# Patient Record
Sex: Male | Born: 1940
Health system: Southern US, Community
[De-identification: ages and names within clinical notes are randomized; demographics above are authoritative.]

## PROBLEM LIST (undated history)

## (undated) DIAGNOSIS — I459 Conduction disorder, unspecified: Secondary | ICD-10-CM

## (undated) DIAGNOSIS — I771 Stricture of artery: Secondary | ICD-10-CM

## (undated) DIAGNOSIS — I2510 Atherosclerotic heart disease of native coronary artery without angina pectoris: Secondary | ICD-10-CM

## (undated) DIAGNOSIS — M1990 Unspecified osteoarthritis, unspecified site: Secondary | ICD-10-CM

## (undated) DIAGNOSIS — Z95828 Presence of other vascular implants and grafts: Secondary | ICD-10-CM

## (undated) DIAGNOSIS — S92901A Unspecified fracture of right foot, initial encounter for closed fracture: Secondary | ICD-10-CM

## (undated) DIAGNOSIS — E7800 Pure hypercholesterolemia, unspecified: Secondary | ICD-10-CM

## (undated) DIAGNOSIS — S8010XA Contusion of unspecified lower leg, initial encounter: Secondary | ICD-10-CM

## (undated) DIAGNOSIS — I10 Essential (primary) hypertension: Secondary | ICD-10-CM

## (undated) HISTORY — DX: Presence of other vascular implants and grafts: Z95.828

## (undated) HISTORY — PX: PROSTATECTOMY: SHX69

## (undated) HISTORY — DX: Atherosclerotic heart disease of native coronary artery without angina pectoris: I25.10

## (undated) HISTORY — PX: CATARACT EXTRACTION W/ INTRAOCULAR LENS  IMPLANT, BILATERAL: SHX1307

---

## 1994-12-19 DIAGNOSIS — S92901A Unspecified fracture of right foot, initial encounter for closed fracture: Secondary | ICD-10-CM

## 1994-12-19 HISTORY — DX: Unspecified fracture of right foot, initial encounter for closed fracture: S92.901A

## 2006-12-19 HISTORY — PX: CORONARY ARTERY BYPASS GRAFT: SHX141

## 2014-12-19 DIAGNOSIS — Z95828 Presence of other vascular implants and grafts: Secondary | ICD-10-CM

## 2014-12-19 HISTORY — PX: SUBCLAVIAN ARTERY STENT: SHX2452

## 2014-12-19 HISTORY — PX: CORONARY ANGIOPLASTY WITH STENT PLACEMENT: SHX49

## 2014-12-19 HISTORY — DX: Presence of other vascular implants and grafts: Z95.828

## 2017-08-16 DIAGNOSIS — H04123 Dry eye syndrome of bilateral lacrimal glands: Secondary | ICD-10-CM | POA: Diagnosis not present

## 2017-08-16 DIAGNOSIS — H401132 Primary open-angle glaucoma, bilateral, moderate stage: Secondary | ICD-10-CM | POA: Diagnosis not present

## 2017-08-16 DIAGNOSIS — H538 Other visual disturbances: Secondary | ICD-10-CM | POA: Diagnosis not present

## 2017-08-16 DIAGNOSIS — H5712 Ocular pain, left eye: Secondary | ICD-10-CM | POA: Diagnosis not present

## 2017-08-16 DIAGNOSIS — H10503 Unspecified blepharoconjunctivitis, bilateral: Secondary | ICD-10-CM | POA: Diagnosis not present

## 2017-08-16 DIAGNOSIS — H353131 Nonexudative age-related macular degeneration, bilateral, early dry stage: Secondary | ICD-10-CM | POA: Diagnosis not present

## 2017-08-18 DIAGNOSIS — H04123 Dry eye syndrome of bilateral lacrimal glands: Secondary | ICD-10-CM | POA: Diagnosis not present

## 2017-08-18 DIAGNOSIS — H5712 Ocular pain, left eye: Secondary | ICD-10-CM | POA: Diagnosis not present

## 2017-08-18 DIAGNOSIS — H10503 Unspecified blepharoconjunctivitis, bilateral: Secondary | ICD-10-CM | POA: Diagnosis not present

## 2017-08-18 DIAGNOSIS — H401132 Primary open-angle glaucoma, bilateral, moderate stage: Secondary | ICD-10-CM | POA: Diagnosis not present

## 2017-08-18 DIAGNOSIS — H353131 Nonexudative age-related macular degeneration, bilateral, early dry stage: Secondary | ICD-10-CM | POA: Diagnosis not present

## 2017-08-18 DIAGNOSIS — H538 Other visual disturbances: Secondary | ICD-10-CM | POA: Diagnosis not present

## 2017-08-23 DIAGNOSIS — I2510 Atherosclerotic heart disease of native coronary artery without angina pectoris: Secondary | ICD-10-CM | POA: Diagnosis not present

## 2017-08-23 DIAGNOSIS — Z6827 Body mass index (BMI) 27.0-27.9, adult: Secondary | ICD-10-CM | POA: Diagnosis not present

## 2017-08-23 DIAGNOSIS — Z951 Presence of aortocoronary bypass graft: Secondary | ICD-10-CM | POA: Diagnosis not present

## 2017-08-23 DIAGNOSIS — E7800 Pure hypercholesterolemia, unspecified: Secondary | ICD-10-CM | POA: Diagnosis not present

## 2017-08-23 DIAGNOSIS — I119 Hypertensive heart disease without heart failure: Secondary | ICD-10-CM | POA: Diagnosis not present

## 2017-08-25 DIAGNOSIS — H10503 Unspecified blepharoconjunctivitis, bilateral: Secondary | ICD-10-CM | POA: Diagnosis not present

## 2017-08-25 DIAGNOSIS — H401132 Primary open-angle glaucoma, bilateral, moderate stage: Secondary | ICD-10-CM | POA: Diagnosis not present

## 2017-08-25 DIAGNOSIS — H04123 Dry eye syndrome of bilateral lacrimal glands: Secondary | ICD-10-CM | POA: Diagnosis not present

## 2017-08-25 DIAGNOSIS — H538 Other visual disturbances: Secondary | ICD-10-CM | POA: Diagnosis not present

## 2017-08-25 DIAGNOSIS — H353131 Nonexudative age-related macular degeneration, bilateral, early dry stage: Secondary | ICD-10-CM | POA: Diagnosis not present

## 2017-08-25 DIAGNOSIS — H5712 Ocular pain, left eye: Secondary | ICD-10-CM | POA: Diagnosis not present

## 2017-08-28 DIAGNOSIS — H5712 Ocular pain, left eye: Secondary | ICD-10-CM | POA: Diagnosis not present

## 2017-08-28 DIAGNOSIS — H353131 Nonexudative age-related macular degeneration, bilateral, early dry stage: Secondary | ICD-10-CM | POA: Diagnosis not present

## 2017-08-28 DIAGNOSIS — H401132 Primary open-angle glaucoma, bilateral, moderate stage: Secondary | ICD-10-CM | POA: Diagnosis not present

## 2017-08-28 DIAGNOSIS — H04123 Dry eye syndrome of bilateral lacrimal glands: Secondary | ICD-10-CM | POA: Diagnosis not present

## 2017-08-28 DIAGNOSIS — H538 Other visual disturbances: Secondary | ICD-10-CM | POA: Diagnosis not present

## 2017-08-28 DIAGNOSIS — H10503 Unspecified blepharoconjunctivitis, bilateral: Secondary | ICD-10-CM | POA: Diagnosis not present

## 2018-01-19 DIAGNOSIS — S8010XA Contusion of unspecified lower leg, initial encounter: Secondary | ICD-10-CM

## 2018-01-19 HISTORY — DX: Contusion of unspecified lower leg, initial encounter: S80.10XA

## 2018-02-03 ENCOUNTER — Encounter (HOSPITAL_COMMUNITY): Payer: Self-pay | Admitting: Emergency Medicine

## 2018-02-03 ENCOUNTER — Other Ambulatory Visit: Payer: Self-pay

## 2018-02-03 DIAGNOSIS — I10 Essential (primary) hypertension: Secondary | ICD-10-CM | POA: Insufficient documentation

## 2018-02-03 DIAGNOSIS — R2241 Localized swelling, mass and lump, right lower limb: Secondary | ICD-10-CM | POA: Insufficient documentation

## 2018-02-03 DIAGNOSIS — R238 Other skin changes: Secondary | ICD-10-CM | POA: Diagnosis not present

## 2018-02-03 DIAGNOSIS — M7981 Nontraumatic hematoma of soft tissue: Secondary | ICD-10-CM | POA: Diagnosis not present

## 2018-02-03 NOTE — ED Triage Notes (Addendum)
Pt to ED with c/o left lower leg swelling and bruise at the calve muscle.  No known injury  Pt is currently taking Plavix

## 2018-02-04 ENCOUNTER — Emergency Department (HOSPITAL_COMMUNITY)
Admission: EM | Admit: 2018-02-04 | Discharge: 2018-02-04 | Disposition: A | Discharge status: Home / Self Care | Payer: Medicare Other | Attending: Emergency Medicine | Admitting: Emergency Medicine

## 2018-02-04 DIAGNOSIS — T148XXA Other injury of unspecified body region, initial encounter: Secondary | ICD-10-CM

## 2018-02-04 HISTORY — DX: Pure hypercholesterolemia, unspecified: E78.00

## 2018-02-04 HISTORY — DX: Essential (primary) hypertension: I10

## 2018-02-04 NOTE — Discharge Instructions (Signed)
Continue your Plavix. Compression stocking as needed.

## 2018-02-04 NOTE — ED Provider Notes (Signed)
[facility]   CSN: [id number] Arrival date & time: 02/03/18  2109     History   Chief Complaint Chief Complaint  Patient presents with  . Leg Swelling    HPI Robert GreavesRichard Flippen is a 77 y.o. male.  Chief complaint is leg bruise  HPI 77 year old male.  On Plavix for history of PTCA with stents.  He noticed that his right lower leg had an area of "purple" discoloration on it.  It is "a little sore".  No known injury.  No swelling.  No pleuritic chest pain or shortness of breath.  No history of DVT or PE.  Past Medical History:  Diagnosis Date  . High cholesterol   . Hypertension     There are no active problems to display for this patient.   Past Surgical History:  Procedure Laterality Date  . cardiac stents    . CARDIAC SURGERY    . PROSTATE SURGERY         Home Medications    Prior to Admission medications   Not on File    Family History No family history on file.  Social History Social History   Tobacco Use  . Smoking status: Never Smoker  . Smokeless tobacco: Never Used  Substance Use Topics  . Alcohol use: Yes  . Drug use: No     Allergies   Patient has no allergy information on record.   Review of Systems Review of Systems  Constitutional: Negative for appetite change, chills, diaphoresis, fatigue and fever.  HENT: Negative for mouth sores, sore throat and trouble swallowing.   Eyes: Negative for visual disturbance.  Respiratory: Negative for cough, chest tightness, shortness of breath and wheezing.   Cardiovascular: Negative for chest pain.  Gastrointestinal: Negative for abdominal distention, abdominal pain, diarrhea, nausea and vomiting.  Endocrine: Negative for polydipsia, polyphagia and polyuria.  Genitourinary: Negative for dysuria, frequency and hematuria.  Musculoskeletal: Negative for gait problem.  Skin: Positive for color change. Negative for pallor and rash.  Neurological:  Negative for dizziness, syncope, light-headedness and headaches.  Hematological: Does not bruise/bleed easily.  Psychiatric/Behavioral: Negative for behavioral problems and confusion.     Physical Exam Updated Vital Signs BP (!) 161/75 (BP Location: Right Arm)   Pulse 65   Temp 98.6 F (37 C) (Oral)   Resp 15   Ht 5\' 9"  (1.753 m)   Wt 81.6 kg (180 lb)   SpO2 100%   BMI 26.58 kg/m   Physical Exam  Constitutional: He is oriented to person, place, and time. He appears well-developed and well-nourished. No distress.  HENT:  Head: Normocephalic.  Eyes: Conjunctivae are normal. Pupils are equal, round, and reactive to light. No scleral icterus.  Neck: Normal range of motion. Neck supple. No thyromegaly present.  Cardiovascular: Normal rate and regular rhythm. Exam reveals no gallop and no friction rub.  No murmur heard. Pulmonary/Chest: Effort normal and breath sounds normal. No respiratory distress. He has no wheezes. He has no rales.  Abdominal: Soft. Bowel sounds are normal. He exhibits no distension. There is no tenderness. There is no rebound.  Musculoskeletal: Normal range of motion.  Neurological: He is alert and oriented to person, place, and time.  Skin: No rash noted.  Area of circular discoloration consistent with hematoma in the right lower leg.  No palpable cords.  No swelling.  Psychiatric: He has a normal mood and affect. His behavior is normal.     ED Treatments /  Results  Labs (all labs ordered are listed, but only abnormal results are displayed) Labs Reviewed - No data to display  EKG  EKG Interpretation None       Radiology No results found.  Procedures Procedures (including critical care time)  Medications Ordered in ED Medications - No data to display   Initial Impression / Assessment and Plan / ED Course  I have reviewed the triage vital signs and the nursing notes.  Pertinent labs & imaging results that were available during my care of  the patient were reviewed by me and considered in my medical decision making (see chart for details).     Appropriate for discharge home.  Continue Plavix.  Ace wrap or compression stocking  Final Clinical Impressions(s) / ED Diagnoses   Final diagnoses:  Hematoma    ED Discharge Orders    None       Rolland Porter, MD 02/04/18 [phone]

## 2018-03-28 ENCOUNTER — Telehealth: Payer: Self-pay

## 2018-03-28 DIAGNOSIS — I2510 Atherosclerotic heart disease of native coronary artery without angina pectoris: Secondary | ICD-10-CM | POA: Diagnosis not present

## 2018-03-28 DIAGNOSIS — E7800 Pure hypercholesterolemia, unspecified: Secondary | ICD-10-CM | POA: Diagnosis not present

## 2018-03-28 DIAGNOSIS — Z951 Presence of aortocoronary bypass graft: Secondary | ICD-10-CM | POA: Diagnosis not present

## 2018-03-28 DIAGNOSIS — Z1389 Encounter for screening for other disorder: Secondary | ICD-10-CM | POA: Diagnosis not present

## 2018-03-28 DIAGNOSIS — I10 Essential (primary) hypertension: Secondary | ICD-10-CM | POA: Diagnosis not present

## 2018-03-28 NOTE — Telephone Encounter (Signed)
Sent referral to scheduling 

## 2018-05-04 ENCOUNTER — Encounter: Payer: Self-pay | Admitting: Cardiology

## 2018-05-22 DIAGNOSIS — I2510 Atherosclerotic heart disease of native coronary artery without angina pectoris: Secondary | ICD-10-CM | POA: Diagnosis not present

## 2018-05-22 DIAGNOSIS — Z23 Encounter for immunization: Secondary | ICD-10-CM | POA: Diagnosis not present

## 2018-05-22 DIAGNOSIS — Z1389 Encounter for screening for other disorder: Secondary | ICD-10-CM | POA: Diagnosis not present

## 2018-05-22 DIAGNOSIS — I10 Essential (primary) hypertension: Secondary | ICD-10-CM | POA: Diagnosis not present

## 2018-05-22 DIAGNOSIS — E7800 Pure hypercholesterolemia, unspecified: Secondary | ICD-10-CM | POA: Diagnosis not present

## 2018-05-22 DIAGNOSIS — Z0000 Encounter for general adult medical examination without abnormal findings: Secondary | ICD-10-CM | POA: Diagnosis not present

## 2018-05-22 DIAGNOSIS — N400 Enlarged prostate without lower urinary tract symptoms: Secondary | ICD-10-CM | POA: Diagnosis not present

## 2018-05-25 ENCOUNTER — Ambulatory Visit (INDEPENDENT_AMBULATORY_CARE_PROVIDER_SITE_OTHER): Payer: Medicare Other | Admitting: Cardiology

## 2018-05-25 ENCOUNTER — Encounter: Payer: Self-pay | Admitting: Cardiology

## 2018-05-25 VITALS — BP 142/78 | HR 80 | Ht 69.0 in | Wt 182.0 lb

## 2018-05-25 DIAGNOSIS — I2510 Atherosclerotic heart disease of native coronary artery without angina pectoris: Secondary | ICD-10-CM | POA: Diagnosis not present

## 2018-05-25 DIAGNOSIS — E785 Hyperlipidemia, unspecified: Secondary | ICD-10-CM | POA: Diagnosis not present

## 2018-05-25 DIAGNOSIS — I10 Essential (primary) hypertension: Secondary | ICD-10-CM | POA: Insufficient documentation

## 2018-05-25 DIAGNOSIS — Z95828 Presence of other vascular implants and grafts: Secondary | ICD-10-CM | POA: Diagnosis not present

## 2018-05-25 NOTE — Progress Notes (Signed)
Cardiology Office Note:    Date:  05/25/2018   ID:  Robert [name], DOB 08-30-1941, MRN [id number]  PCP:  Robert Housekeeper, MD  Cardiologist:   Robert Bollman, MD  - New   Referring MD: Robert Housekeeper, MD   Chief Complaint  Patient presents with  . Coronary Artery Disease    establish care    History of Present Illness:    Robert [name] is a 77 y.o. male who is being seen today for the establishment of cardiology care at the request of Robert Housekeeper, MD.   The patient has a past medical history significant for hypertension, hyperlipidemia, CAD status post CABG 2008, right subclavian stent-on Plavix (2016). He has moved here from [place] in September 2018. He is here to establish cardiology care. He is followed at [facility] for primary care.   The patient went ot the ED on 02/04/18 with right calf hematoma. He is reportedly on Plavix for right subclavian stent placement in 2016.   Robert [name] had a CABG X 4 after being evaluated due to his brother needing bypass surgery. He was found to have blockages but never had symptoms or a heart attack. Since then he has had an MI and stent several years later. He does not know the specifics. His symptoms with the MI were a mild chest tightness and just feeling different while he was walking on his treadmill. I have obtained records from his cardiologist, Dr. Jonny Ruiz [name] in [place], [place]. No recent stress testing. The patient reported having an echo just before moving here in September but his cardiologist has no record of any echo. Per his prior cardiologist, since his CABG was 10 years ago he wanted to do a stress test but the patient deferred and wanted to wait until he moved.   He is here with his wife. He has had no chest pain/pressure/tightness, shortness of breath, fatigue, swelling (except for scant LLE since vein removal with CABG), orthopnea, lightheadedness.  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 Married with 2  sons. One son lives in [place] and one lives with the patient. His brother lives here in [place]. He tries to walk outside for 30-60 minutes or on treadmill for 20 minutes everyday. He has had no exertional symptoms. He has never smoked. Very rare alcohol use.   PAD Screen 05/25/2018  Previous PAD dx? No  Previous surgical procedure? No  Pain with walking? No  Feet/toe relief with dangling? No  Painful, non-healing ulcers? No  Extremities discolored? No     Past Medical History:  Diagnosis Date  . CAD (coronary artery disease)    a. CABG X4 2008. b. MI and stent (in [place])  . Fracture 1996   FOOT  . High cholesterol   . Hypertension   . Presence of arterial stent 2016   Right subclavian artery    Past Surgical History:  Procedure Laterality Date  . cardiac stents    . CARDIAC SURGERY    . CORONARY ARTERY BYPASS GRAFT  2008  . PROSTATE SURGERY      Current Medications: Current Meds  Medication Sig  . amLODipine (NORVASC) 10 MG tablet Take 10 mg by mouth daily.  Marland Kitchen aspirin 81 MG EC tablet Take 81 mg by mouth daily. Swallow whole.  Marland Kitchen atorvastatin (LIPITOR) 40 MG tablet Take 40 mg by mouth daily.  . clopidogrel (PLAVIX) 75 MG tablet Take 75 mg by mouth daily.  . hydrochlorothiazide (MICROZIDE) 12.5 MG  capsule Take 12.5 mg by mouth daily.  . Magnesium 200 MG TABS Take 2 tablets by mouth daily with breakfast.  . metoprolol succinate (TOPROL-XL) 50 MG 24 hr tablet Take 50 mg by mouth 3 (three) times daily. Take with or immediately following a meal. Sprinkle 1 capsule orally once a day.   . nitroGLYCERIN (NITROSTAT) 0.4 MG SL tablet Place 1 tablet under the tongue as needed.  . ramipril (ALTACE) 10 MG capsule Take 10 mg by mouth daily.     Allergies:   Patient has no known allergies.   Social History   Socioeconomic History  . Marital status: Married    Spouse name: Not on file  . Number of children: 2  . Years of education: COLLEGE  . Highest education level: Not on  file  Occupational History  . Occupation: RETIRED  Social Needs  . Financial resource strain: Not on file  . Food insecurity:    Worry: Not on file    Inability: Not on file  . Transportation needs:    Medical: Not on file    Non-medical: Not on file  Tobacco Use  . Smoking status: Never Smoker  . Smokeless tobacco: Never Used  Substance and Sexual Activity  . Alcohol use: Yes  . Drug use: No  . Sexual activity: Not on file  Lifestyle  . Physical activity:    Days per week: Not on file    Minutes per session: Not on file  . Stress: Not on file  Relationships  . Social connections:    Talks on phone: Not on file    Gets together: Not on file    Attends religious service: Not on file    Active member of club or organization: Not on file    Attends meetings of clubs or organizations: Not on file    Relationship status: Not on file  Other Topics Concern  . Not on file  Social History Narrative  . Not on file     Family History: The patient's family history includes CAD (age of onset: 448) in his father; CAD (age of onset: 8372) in his brother; Diabetes in his brother; Healthy in his mother; Hypertension in his brother. ROS:   Please see the history of present illness.     All other systems reviewed and are negative.  EKGs/Labs/Other Studies Reviewed:    The following studies were reviewed today: None available  EKG:  EKG is  ordered today.  The ekg ordered today demonstrates NSR 80 bpm,   Recent Labs: No results found for requested labs within last 8760 hours.   Recent Lipid Panel No results found for: CHOL, TRIG, HDL, CHOLHDL, VLDL, LDLCALC, LDLDIRECT  Physical Exam:    VS:  BP (!) 142/78   Pulse 80   Ht 5\' 9"  (1.753 m)   Wt 182 lb (82.6 kg)   SpO2 98%   BMI 26.88 kg/m     Wt Readings from Last 3 Encounters:  05/25/18 182 lb (82.6 kg)  02/03/18 180 lb (81.6 kg)     GEN:  Well nourished, well developed in no acute distress HEENT: Normal NECK: No JVD;  No carotid bruits LYMPHATICS: No lymphadenopathy CARDIAC: RRR, no murmurs, rubs, gallops RESPIRATORY:  Clear to auscultation without rales, wheezing or rhonchi  ABDOMEN: Soft, non-tender, non-distended MUSCULOSKELETAL:  Trace edema of left lower leg; No deformity  SKIN: Warm and dry NEUROLOGIC:  Alert and oriented x 3 PSYCHIATRIC:  Normal affect   ASSESSMENT:  1. Coronary artery disease involving native coronary artery of native heart without angina pectoris   2. Presence of arterial stent   3. Essential (primary) hypertension   4. Hyperlipidemia LDL goal <70    PLAN:    This patient's case was discussed in depth with Dr. Excell Seltzer. The plan below was formulated per our discussion.  In order of problems listed above:  CAD: History CABG 2008, on aspirin 81 mg, Plavix 75 mg, Toprol-XL, ramipril, atorvastatin. He has had no anginal or heart failure type symptoms and is fairly active. He had no symptoms prior to his CABG. I agree with his prior cardiologist that a stress test would be prudent since his bypass is over 41 years old and he had no symptoms prior to his bypass. The patient agrees. Will arrange for exercise myoview. He understands that if it is abnormal he would need a cardiac cath to further evaluate.  Will follow up in 6 months unless abnormal testing.   Hx of right subclavian stent in 2016: on Plavix. Pt had spontaneous calf hematoma earlier this year. Per Dr. Excell Seltzer, the duration of Plavix after a peripheral stent is 30 days. Can stop Plavix after we that stress test is normal. Continue aspirin. Pt instructed on this plan.   Hypertension: On amlodipine 10 mg, hydrochlorothiazide 12.5 mg, Toprol-XL 50 mg, ramipril 10 mg. BP well controlled, continue current medications. If he needs [place] to refill then he will call.  Labs at [facility] physicians on 05/22/2018 indicated serum creatinine 1.13.  Hyperlipidemia: On atorvastatin 40 mg daily. On 05/22/18 LDL 69 which is at goal <70.  Continue  current therapy    Medication Adjustments/Labs and Tests Ordered: Current medicines are reviewed at length with the patient today.  Concerns regarding medicines are outlined above. Labs and tests ordered and medication changes are outlined in the patient instructions below:  Patient Instructions  Medication Instructions: Your physician recommends that you continue on your current medications as directed. Please refer to the Current Medication list given to you today.   Labwork: None Ordered  Procedures/Testing: Your physician has requested that you have en exercise stress myoview. For further information please visit [email or web address] Please follow instruction sheet, as given.    Follow-Up: Your physician recommends that you schedule a follow-up appointment in: 6 months with Dr.Cooper    Any Additional Special Instructions Will Be Listed Below (If Applicable).  You can stop your Plavix if your stress test is normal    If you need a refill on your cardiac medications before your next appointment, please call your pharmacy. '    Signed, Berton Bon, NP  05/25/2018 3:17 PM    [facility]

## 2018-05-25 NOTE — Patient Instructions (Addendum)
Medication Instructions: Your physician recommends that you continue on your current medications as directed. Please refer to the Current Medication list given to you today.   Labwork: None Ordered  Procedures/Testing: Your physician has requested that you have en exercise stress myoview. For further information please visit [email or web address]. Please follow instruction sheet, as given.    Follow-Up: Your physician recommends that you schedule a follow-up appointment in: 6 months with Dr.Cooper    Any Additional Special Instructions Will Be Listed Below (If Applicable).  You can stop your Plavix if your stress test is normal    If you need a refill on your cardiac medications before your next appointment, please call your pharmacy. '

## 2018-06-04 ENCOUNTER — Telehealth (HOSPITAL_COMMUNITY): Payer: Self-pay | Admitting: *Deleted

## 2018-06-04 NOTE — Telephone Encounter (Signed)
Patient given detailed instructions per Myocardial Perfusion Study Information Sheet for the test on 06/06/18. Patient notified to arrive 15 minutes early and that it is imperative to arrive on time for appointment to keep from having the test rescheduled.  If you need to cancel or reschedule your appointment, please call the office within 24 hours of your appointment. . Patient verbalized understanding. Robert [name] [name]     

## 2018-06-06 ENCOUNTER — Ambulatory Visit (HOSPITAL_COMMUNITY): Payer: Medicare Other | Attending: Cardiology

## 2018-06-06 DIAGNOSIS — Z8249 Family history of ischemic heart disease and other diseases of the circulatory system: Secondary | ICD-10-CM | POA: Insufficient documentation

## 2018-06-06 DIAGNOSIS — I491 Atrial premature depolarization: Secondary | ICD-10-CM | POA: Insufficient documentation

## 2018-06-06 DIAGNOSIS — R9439 Abnormal result of other cardiovascular function study: Secondary | ICD-10-CM | POA: Insufficient documentation

## 2018-06-06 DIAGNOSIS — I2510 Atherosclerotic heart disease of native coronary artery without angina pectoris: Secondary | ICD-10-CM | POA: Diagnosis not present

## 2018-06-06 DIAGNOSIS — I493 Ventricular premature depolarization: Secondary | ICD-10-CM | POA: Diagnosis not present

## 2018-06-06 DIAGNOSIS — I10 Essential (primary) hypertension: Secondary | ICD-10-CM | POA: Diagnosis not present

## 2018-06-06 LAB — MYOCARDIAL PERFUSION IMAGING
CSEPEDS: 2 s
Estimated workload: 4.6 METS
Exercise duration (min): 4 min
LVDIAVOL: 42 mL (ref 62–150)
LVSYSVOL: 12 mL
MPHR: 144 {beats}/min
Peak HR: 153 {beats}/min
Percent HR: 106 %
RATE: 0.31
Rest HR: 104 {beats}/min
SDS: 2
SRS: 18
SSS: 16
TID: 0.95

## 2018-06-06 MED ORDER — TECHNETIUM TC 99M TETROFOSMIN IV KIT
30.5000 | PACK | Freq: Once | INTRAVENOUS | Status: AC | PRN
  Administered 2018-06-06: 30.5 via INTRAVENOUS
  Filled 2018-06-06: qty 31

## 2018-06-06 MED ORDER — TECHNETIUM TC 99M TETROFOSMIN IV KIT
10.5000 | PACK | Freq: Once | INTRAVENOUS | Status: AC | PRN
  Administered 2018-06-06: 10.5 via INTRAVENOUS
  Filled 2018-06-06: qty 11

## 2018-06-29 DIAGNOSIS — I2510 Atherosclerotic heart disease of native coronary artery without angina pectoris: Secondary | ICD-10-CM | POA: Diagnosis not present

## 2018-06-29 DIAGNOSIS — T733XXA Exhaustion due to excessive exertion, initial encounter: Secondary | ICD-10-CM | POA: Diagnosis not present

## 2018-06-29 DIAGNOSIS — I10 Essential (primary) hypertension: Secondary | ICD-10-CM | POA: Diagnosis not present

## 2018-07-04 ENCOUNTER — Telehealth: Payer: Self-pay | Admitting: Cardiovascular Disease

## 2018-07-04 NOTE — Telephone Encounter (Signed)
New Message:       STAT if HR is under 50 or over 120 (normal HR is 60-100 beats per minute)  1) What is your heart rate? 40-80; pt states he has not taken his bp today to see his heart rate  2) Do you have a log of your heart rate readings (document readings)?   3) Do you have any other symptoms? Sob/Pt states when he is sitting is in down and if he gets up and moves around it will increase.  Pt c/o Shortness Of Breath: STAT if SOB developed within the last 24 hours or pt is noticeably SOB on the phone  1. Are you currently SOB (can you hear that pt is SOB on the phone)? No  2. How long have you been experiencing SOB? About a week  3. Are you SOB when sitting or when up moving around? Both but pt states more likely when he is sitting.  4. Are you currently experiencing any other symptoms? Low pulse rate

## 2018-07-04 NOTE — Telephone Encounter (Signed)
Spoke to patient and informed him not to take his Metoprolol Suucinate (3 x 50mg  pills) all at one time in the morning, which is lowering his HR. I told him to spread these out on 7/18 and call us 7/19 with an update.

## 2018-07-05 ENCOUNTER — Other Ambulatory Visit: Payer: Self-pay

## 2018-07-05 ENCOUNTER — Emergency Department (HOSPITAL_COMMUNITY)
Admission: EM | Admit: 2018-07-05 | Discharge: 2018-07-05 | Disposition: A | Discharge status: Home / Self Care | Payer: Medicare Other | Attending: Emergency Medicine | Admitting: Emergency Medicine

## 2018-07-05 ENCOUNTER — Emergency Department (HOSPITAL_COMMUNITY): Payer: Medicare Other

## 2018-07-05 ENCOUNTER — Encounter (HOSPITAL_COMMUNITY): Payer: Self-pay | Admitting: Emergency Medicine

## 2018-07-05 DIAGNOSIS — I10 Essential (primary) hypertension: Secondary | ICD-10-CM | POA: Diagnosis not present

## 2018-07-05 DIAGNOSIS — R079 Chest pain, unspecified: Secondary | ICD-10-CM | POA: Insufficient documentation

## 2018-07-05 DIAGNOSIS — R002 Palpitations: Secondary | ICD-10-CM | POA: Insufficient documentation

## 2018-07-05 DIAGNOSIS — I2510 Atherosclerotic heart disease of native coronary artery without angina pectoris: Secondary | ICD-10-CM | POA: Insufficient documentation

## 2018-07-05 DIAGNOSIS — Z79899 Other long term (current) drug therapy: Secondary | ICD-10-CM | POA: Diagnosis not present

## 2018-07-05 DIAGNOSIS — R42 Dizziness and giddiness: Secondary | ICD-10-CM | POA: Diagnosis not present

## 2018-07-05 DIAGNOSIS — R0789 Other chest pain: Secondary | ICD-10-CM | POA: Diagnosis not present

## 2018-07-05 LAB — BASIC METABOLIC PANEL
ANION GAP: 8 (ref 5–15)
BUN: 23 mg/dL (ref 8–23)
CO2: 26 mmol/L (ref 22–32)
Calcium: 9.4 mg/dL (ref 8.9–10.3)
Chloride: 104 mmol/L (ref 98–111)
Creatinine, Ser: 1.25 mg/dL — ABNORMAL HIGH (ref 0.61–1.24)
GFR calc Af Amer: >60 mL/min (ref >60)
GFR, EST NON AFRICAN AMERICAN: 54 mL/min — AB (ref >60)
GLUCOSE: 135 mg/dL — AB (ref 70–99)
Potassium: 4 mmol/L (ref 3.5–5.1)
Sodium: 138 mmol/L (ref 135–145)

## 2018-07-05 LAB — URINALYSIS, ROUTINE W REFLEX MICROSCOPIC
Bilirubin Urine: NEGATIVE
GLUCOSE, UA: NEGATIVE mg/dL
Hgb urine dipstick: NEGATIVE
Ketones, ur: NEGATIVE mg/dL
LEUKOCYTES UA: NEGATIVE
Nitrite: NEGATIVE
PH: 6 (ref 5.0–8.0)
Protein, ur: NEGATIVE mg/dL
Specific Gravity, Urine: 1.019 (ref 1.005–1.030)

## 2018-07-05 LAB — CBC
HCT: 50.4 % (ref 39.0–52.0)
Hemoglobin: 16.3 g/dL (ref 13.0–17.0)
MCH: 30.5 pg (ref 26.0–34.0)
MCHC: 32.3 g/dL (ref 30.0–36.0)
MCV: 94.4 fL (ref 78.0–100.0)
Platelets: 346 10*3/uL (ref 150–400)
RBC: 5.34 MIL/uL (ref 4.22–5.81)
RDW: 13.6 % (ref 11.5–15.5)
WBC: 11.7 10*3/uL — ABNORMAL HIGH (ref 4.0–10.5)

## 2018-07-05 LAB — I-STAT TROPONIN, ED: TROPONIN I, POC: 0 ng/mL (ref 0.00–0.08)

## 2018-07-05 LAB — BRAIN NATRIURETIC PEPTIDE: B Natriuretic Peptide: 44.9 pg/mL (ref 0.0–100.0)

## 2018-07-05 LAB — MAGNESIUM: Magnesium: 2.5 mg/dL — ABNORMAL HIGH (ref 1.7–2.4)

## 2018-07-05 MED ORDER — METOPROLOL SUCCINATE ER 50 MG PO TB24: 50.0000 mg | ORAL_TABLET | Freq: Two times a day (BID) | ORAL | 3 refills | Status: DC

## 2018-07-05 NOTE — ED Provider Notes (Signed)
[facility] EMERGENCY DEPARTMENT Provider Note   CSN: [id number] Arrival date & time: 07/05/18  4098     History   Chief Complaint Chief Complaint  Patient presents with  . Chest Pain    HPI Robert [name] is a 77 y.o. male.  HPI Patient states he has been taking 150 mg of extended release metoprolol every morning for the past few years.  He is noticed that his heart rate has dropped into the 40s and he experiences lightheadedness and chest pressure with this.  This been occurring for the past week.  Discussed with his cardiologist and advised to cut dose 100 mg daily.  Patient states that today he only took 50 mg this morning.  Initially had some lightheadedness which is now resolved.  Denies any chest pain.  States he has had episodic irregular beats.  No shortness of breath, cough, fever or chills.  No new lower extremity swelling.  No other recent medication changes. Past Medical History:  Diagnosis Date  . CAD (coronary artery disease)    a. CABG X4 2008. b. MI and stent (in [place])  . Fracture 1996   FOOT  . High cholesterol   . Hypertension   . Presence of arterial stent 2016   Right subclavian artery    Patient Active Problem List   Diagnosis Date Noted  . CAD (coronary artery disease) 05/25/2018  . Essential (primary) hypertension 05/25/2018  . Hyperlipidemia LDL goal <70 05/25/2018    Past Surgical History:  Procedure Laterality Date  . cardiac stents    . CARDIAC SURGERY    . CORONARY ARTERY BYPASS GRAFT  2008  . PROSTATE SURGERY          Home Medications    Prior to Admission medications   Medication Sig Start Date End Date Taking? Authorizing Provider  amLODipine (NORVASC) 10 MG tablet Take 10 mg by mouth daily.   Yes [provider]  aspirin 81 MG EC tablet Take 81 mg by mouth at bedtime. Swallow whole.    Yes [provider]  atorvastatin (LIPITOR) 40 MG tablet Take 40 mg by mouth daily.   Yes [provider]  hydrochlorothiazide (MICROZIDE) 12.5 MG capsule Take 12.5 mg by mouth daily.   Yes [provider]  Magnesium 200 MG TABS Take 400 mg by mouth daily with breakfast.    Yes [provider]  metoprolol succinate (TOPROL-XL) 50 MG 24 hr tablet Take 1 tablet (50 mg total) by mouth 2 (two) times daily. Take with or immediately following a meal. 07/05/18  Yes Tonny Bollman, MD  nitroGLYCERIN (NITROSTAT) 0.4 MG SL tablet Place 1 tablet under the tongue as needed. 03/28/18  Yes [provider]  ramipril (ALTACE) 10 MG capsule Take 10 mg by mouth daily.   Yes [provider]    Family History Family History  Problem Relation Age of Onset  . Healthy Mother        lived to be 87  . CAD Father 20  . Diabetes Brother   . Hypertension Brother   . CAD Brother 56       CABG    Social History Social History   Tobacco Use  . Smoking status: Never Smoker  . Smokeless tobacco: Never Used  Substance Use Topics  . Alcohol use: Yes  . Drug use: No     Allergies   Patient has no known allergies.   Review of Systems Review of Systems  Constitutional:  Negative for chills and fever.  HENT: Negative for trouble swallowing.   Eyes: Negative for visual disturbance.  Respiratory: Negative for cough and shortness of breath.   Cardiovascular: Positive for chest pain and palpitations. Negative for leg swelling.  Gastrointestinal: Negative for abdominal pain, diarrhea, nausea and vomiting.  Musculoskeletal: Negative for back pain, myalgias and neck pain.  Skin: Negative for rash and wound.  Neurological: Positive for light-headedness. Negative for dizziness, syncope, weakness, numbness and headaches.  All other systems reviewed and are negative.    Physical Exam Updated Vital Signs BP 114/67   Pulse 70   Temp 98.2 F (36.8 C) (Oral)   Resp 12   SpO2 94%   Physical Exam  Constitutional: He is oriented to person, place, and time. He appears  well-developed and well-nourished. No distress.  HENT:  Head: Normocephalic and atraumatic.  Mouth/Throat: Oropharynx is clear and moist. No oropharyngeal exudate.  Eyes: Pupils are equal, round, and reactive to light. EOM are normal.  Few beats of horizontal nystagmus  Neck: Normal range of motion. Neck supple. No JVD present.  Cardiovascular: Normal rate and regular rhythm. Exam reveals no gallop and no friction rub.  No murmur heard. Pulmonary/Chest: Effort normal and breath sounds normal. No stridor. No respiratory distress. He has no wheezes. He has no rales. He exhibits no tenderness.  Abdominal: Soft. Bowel sounds are normal. There is no tenderness. There is no rebound and no guarding.  Musculoskeletal: Normal range of motion. He exhibits no edema or tenderness.  1+ bilateral lower extremity pitting edema.  No calf asymmetry or tenderness.  Lymphadenopathy:    He has no cervical adenopathy.  Neurological: He is alert and oriented to person, place, and time.  Patient is alert and oriented x3 with clear, goal oriented speech. Patient has 5/5 motor in all extremities. Sensation is intact to light touch.  Skin: Skin is warm and dry. Capillary refill takes less than 2 seconds. No rash noted. He is not diaphoretic. No erythema.  Psychiatric: He has a normal mood and affect. His behavior is normal.  Nursing note and vitals reviewed.    ED Treatments / Results  Labs (all labs ordered are listed, but only abnormal results are displayed) Labs Reviewed  BASIC METABOLIC PANEL - Abnormal; Notable for the following components:      Result Value   Glucose, Bld 135 (*)    Creatinine, Ser 1.25 (*)    GFR calc non Af Amer 54 (*)    All other components within normal limits  CBC - Abnormal; Notable for the following components:   WBC 11.7 (*)    All other components within normal limits  MAGNESIUM - Abnormal; Notable for the following components:   Magnesium 2.5 (*)    All other components  within normal limits  BRAIN NATRIURETIC PEPTIDE  URINALYSIS, ROUTINE W REFLEX MICROSCOPIC  I-STAT TROPONIN, ED    EKG EKG Interpretation  Date/Time:  Thursday July 05 2018 09:17:06 EDT Ventricular Rate:  94 PR Interval:  146 QRS Duration: 76 QT Interval:  354 QTC Calculation: 442 R Axis:   6 Text Interpretation:  Sinus rhythm with frequent Premature ventricular complexes No significant change since last tracing Confirmed by Cathren Laine ([id number]) on 07/05/2018 11:35:53 AM   Radiology Dg Chest 2 View  Result Date: 07/05/2018 CLINICAL DATA:  Chest pain EXAM: CHEST - 2 VIEW COMPARISON:  None. FINDINGS: Lungs are clear. Heart size and pulmonary vascularity are normal. No adenopathy. Patient is status post internal  mammary bypass grafting. No pneumothorax. No evident bone lesions. IMPRESSION: Status post internal mammary bypass grafting. No edema or consolidation. Electronically Signed   By: Bretta BangWilliam  Woodruff III M.D.   On: 07/05/2018 10:23    Procedures Procedures (including critical care time)  Medications Ordered in ED Medications - No data to display   Initial Impression / Assessment and Plan / ED Course  I have reviewed the triage vital signs and the nursing notes.  Pertinent labs & imaging results that were available during my care of the patient were reviewed by me and considered in my medical decision making (see chart for details).     Remains asymptomatic.  He is throwing multiple PVCs.  Blood pressure remains stable.  Heart rate in the 60s and 70s.  No orthostasis. Patient's creatinine is 1.25 but no baseline for comparison.  Electrolytes are normal.  No evidence of UTI.  Normal troponin.  Discussed with Dr. Tenny Crawoss who reviewed patient's medication.  Suggested keeping metoprolol at 50 mg once daily and decreasing amlodipine dose to 5 mg daily.  Will arrange to have patient follow-up in the clinic.  Strict return precautions given. Final Clinical Impressions(s) / ED  Diagnoses   Final diagnoses:  Nonspecific chest pain  Palpitations  Dizziness    ED Discharge Orders    None       Loren RacerYelverton, Raider Valbuena, MD 07/05/18 1500

## 2018-07-05 NOTE — ED Notes (Signed)
Lab to add on labs 

## 2018-07-05 NOTE — Discharge Instructions (Addendum)
Continue taking 50 mg of metoprolol once daily.  Decrease your amlodipine dose from 10 mg to 5 mg daily.  Follow-up closely with Dr. Excell Seltzerooper.  Make sure you are staying well-hydrated.  Change positions slowly.  Return for any worsening symptoms or for any concerns.

## 2018-07-05 NOTE — ED Triage Notes (Signed)
Patient had been taking 150mg  metoprolol every morning until this morning, was told by PCP to decrease to 50mg . Patient complains of dizziness after taking his medication this morning. Patient reports improvement in symptoms since this morning, states he currently feels "weird" but states he is unable to describe this. Patient reports 3/10 pressure in chest that started this morning.

## 2018-07-05 NOTE — Telephone Encounter (Signed)
Spoke with patient and informed him to restructure his Metoprolol Succinate and take 1 tablet twice per day.  He verbalized understanding.  He said that for awhile he has been taking 3 pills each morning with breakfast, but not sure why.

## 2018-07-05 NOTE — ED Notes (Signed)
Pt ambulated to restroom 1

## 2018-07-05 NOTE — Telephone Encounter (Signed)
It's unusual to take Toprol XL TID with meals as it's written. Is there a specific reason he is doing this? Probably should move to 50 mg BID dosing. thx

## 2018-07-08 ENCOUNTER — Encounter (HOSPITAL_COMMUNITY): Payer: Self-pay | Admitting: Emergency Medicine

## 2018-07-08 ENCOUNTER — Inpatient Hospital Stay (HOSPITAL_COMMUNITY)
Admission: EM | Admit: 2018-07-08 | Discharge: 2018-07-13 | DRG: 247 | Disposition: A | Discharge status: Home / Self Care | Payer: Medicare Other | Attending: Internal Medicine | Admitting: Internal Medicine

## 2018-07-08 ENCOUNTER — Other Ambulatory Visit: Payer: Self-pay

## 2018-07-08 ENCOUNTER — Emergency Department (HOSPITAL_COMMUNITY): Payer: Medicare Other

## 2018-07-08 DIAGNOSIS — R002 Palpitations: Secondary | ICD-10-CM | POA: Diagnosis not present

## 2018-07-08 DIAGNOSIS — D72829 Elevated white blood cell count, unspecified: Secondary | ICD-10-CM | POA: Diagnosis present

## 2018-07-08 DIAGNOSIS — I10 Essential (primary) hypertension: Secondary | ICD-10-CM | POA: Diagnosis present

## 2018-07-08 DIAGNOSIS — I25110 Atherosclerotic heart disease of native coronary artery with unstable angina pectoris: Secondary | ICD-10-CM | POA: Diagnosis not present

## 2018-07-08 DIAGNOSIS — Z79899 Other long term (current) drug therapy: Secondary | ICD-10-CM

## 2018-07-08 DIAGNOSIS — I493 Ventricular premature depolarization: Secondary | ICD-10-CM | POA: Diagnosis present

## 2018-07-08 DIAGNOSIS — I252 Old myocardial infarction: Secondary | ICD-10-CM

## 2018-07-08 DIAGNOSIS — Z7982 Long term (current) use of aspirin: Secondary | ICD-10-CM

## 2018-07-08 DIAGNOSIS — R0602 Shortness of breath: Secondary | ICD-10-CM | POA: Diagnosis not present

## 2018-07-08 DIAGNOSIS — T82855A Stenosis of coronary artery stent, initial encounter: Secondary | ICD-10-CM | POA: Diagnosis not present

## 2018-07-08 DIAGNOSIS — I25810 Atherosclerosis of coronary artery bypass graft(s) without angina pectoris: Secondary | ICD-10-CM | POA: Diagnosis not present

## 2018-07-08 DIAGNOSIS — N182 Chronic kidney disease, stage 2 (mild): Secondary | ICD-10-CM | POA: Diagnosis present

## 2018-07-08 DIAGNOSIS — I200 Unstable angina: Secondary | ICD-10-CM

## 2018-07-08 DIAGNOSIS — I2510 Atherosclerotic heart disease of native coronary artery without angina pectoris: Secondary | ICD-10-CM | POA: Diagnosis present

## 2018-07-08 DIAGNOSIS — I129 Hypertensive chronic kidney disease with stage 1 through stage 4 chronic kidney disease, or unspecified chronic kidney disease: Secondary | ICD-10-CM | POA: Diagnosis not present

## 2018-07-08 DIAGNOSIS — R079 Chest pain, unspecified: Secondary | ICD-10-CM

## 2018-07-08 DIAGNOSIS — E7800 Pure hypercholesterolemia, unspecified: Secondary | ICD-10-CM | POA: Diagnosis present

## 2018-07-08 DIAGNOSIS — Z951 Presence of aortocoronary bypass graft: Secondary | ICD-10-CM

## 2018-07-08 DIAGNOSIS — Y831 Surgical operation with implant of artificial internal device as the cause of abnormal reaction of the patient, or of later complication, without mention of misadventure at the time of the procedure: Secondary | ICD-10-CM | POA: Diagnosis present

## 2018-07-08 DIAGNOSIS — Z955 Presence of coronary angioplasty implant and graft: Secondary | ICD-10-CM

## 2018-07-08 HISTORY — DX: Stricture of artery: I77.1

## 2018-07-08 HISTORY — DX: Contusion of unspecified lower leg, initial encounter: S80.10XA

## 2018-07-08 LAB — CBC
HEMATOCRIT: 50.4 % (ref 39.0–52.0)
HEMOGLOBIN: 16.7 g/dL (ref 13.0–17.0)
MCH: 30.6 pg (ref 26.0–34.0)
MCHC: 33.1 g/dL (ref 30.0–36.0)
MCV: 92.5 fL (ref 78.0–100.0)
Platelets: 341 10*3/uL (ref 150–400)
RBC: 5.45 MIL/uL (ref 4.22–5.81)
RDW: 13.5 % (ref 11.5–15.5)
WBC: 12.7 10*3/uL — ABNORMAL HIGH (ref 4.0–10.5)

## 2018-07-08 LAB — BASIC METABOLIC PANEL
ANION GAP: 10 (ref 5–15)
BUN: 27 mg/dL — ABNORMAL HIGH (ref 8–23)
CHLORIDE: 105 mmol/L (ref 98–111)
CO2: 22 mmol/L (ref 22–32)
Calcium: 9.3 mg/dL (ref 8.9–10.3)
Creatinine, Ser: 1.29 mg/dL — ABNORMAL HIGH (ref 0.61–1.24)
GFR calc Af Amer: >60 mL/min (ref >60)
GFR, EST NON AFRICAN AMERICAN: 52 mL/min — AB (ref >60)
Glucose, Bld: 139 mg/dL — ABNORMAL HIGH (ref 70–99)
POTASSIUM: 4.4 mmol/L (ref 3.5–5.1)
SODIUM: 137 mmol/L (ref 135–145)

## 2018-07-08 LAB — I-STAT TROPONIN, ED: Troponin i, poc: 0 ng/mL (ref 0.00–0.08)

## 2018-07-08 NOTE — ED Triage Notes (Signed)
C/o chest pressure and tingling in L arm with sob since this afternoon.  States he was seen in ED for same on Thursday and felt better for a few days.

## 2018-07-09 ENCOUNTER — Encounter (HOSPITAL_COMMUNITY): Payer: Self-pay | Admitting: Family Medicine

## 2018-07-09 ENCOUNTER — Other Ambulatory Visit: Payer: Self-pay

## 2018-07-09 DIAGNOSIS — N182 Chronic kidney disease, stage 2 (mild): Secondary | ICD-10-CM | POA: Diagnosis not present

## 2018-07-09 DIAGNOSIS — I25810 Atherosclerosis of coronary artery bypass graft(s) without angina pectoris: Secondary | ICD-10-CM | POA: Diagnosis not present

## 2018-07-09 DIAGNOSIS — Z7982 Long term (current) use of aspirin: Secondary | ICD-10-CM | POA: Diagnosis not present

## 2018-07-09 DIAGNOSIS — I493 Ventricular premature depolarization: Secondary | ICD-10-CM | POA: Diagnosis not present

## 2018-07-09 DIAGNOSIS — R079 Chest pain, unspecified: Secondary | ICD-10-CM | POA: Diagnosis present

## 2018-07-09 DIAGNOSIS — I25110 Atherosclerotic heart disease of native coronary artery with unstable angina pectoris: Secondary | ICD-10-CM | POA: Diagnosis not present

## 2018-07-09 DIAGNOSIS — R002 Palpitations: Secondary | ICD-10-CM | POA: Diagnosis not present

## 2018-07-09 DIAGNOSIS — D72829 Elevated white blood cell count, unspecified: Secondary | ICD-10-CM | POA: Diagnosis not present

## 2018-07-09 DIAGNOSIS — Z79899 Other long term (current) drug therapy: Secondary | ICD-10-CM | POA: Diagnosis not present

## 2018-07-09 DIAGNOSIS — I10 Essential (primary) hypertension: Secondary | ICD-10-CM | POA: Diagnosis not present

## 2018-07-09 DIAGNOSIS — T82855A Stenosis of coronary artery stent, initial encounter: Secondary | ICD-10-CM | POA: Diagnosis not present

## 2018-07-09 DIAGNOSIS — I129 Hypertensive chronic kidney disease with stage 1 through stage 4 chronic kidney disease, or unspecified chronic kidney disease: Secondary | ICD-10-CM | POA: Diagnosis not present

## 2018-07-09 DIAGNOSIS — Z951 Presence of aortocoronary bypass graft: Secondary | ICD-10-CM | POA: Diagnosis not present

## 2018-07-09 DIAGNOSIS — I2510 Atherosclerotic heart disease of native coronary artery without angina pectoris: Secondary | ICD-10-CM

## 2018-07-09 DIAGNOSIS — E7800 Pure hypercholesterolemia, unspecified: Secondary | ICD-10-CM | POA: Diagnosis not present

## 2018-07-09 LAB — BASIC METABOLIC PANEL
ANION GAP: 9 (ref 5–15)
BUN: 25 mg/dL — ABNORMAL HIGH (ref 8–23)
CHLORIDE: 107 mmol/L (ref 98–111)
CO2: 25 mmol/L (ref 22–32)
CREATININE: 1.09 mg/dL (ref 0.61–1.24)
Calcium: 9.1 mg/dL (ref 8.9–10.3)
GFR calc non Af Amer: >60 mL/min (ref >60)
Glucose, Bld: 83 mg/dL (ref 70–99)
POTASSIUM: 3.6 mmol/L (ref 3.5–5.1)
Sodium: 141 mmol/L (ref 135–145)

## 2018-07-09 LAB — TROPONIN I
Troponin I: <0.03 ng/mL (ref <0.03)
Troponin I: 0.06 ng/mL (ref <0.03)
Troponin I: <0.03 ng/mL (ref <0.03)

## 2018-07-09 LAB — HEPARIN LEVEL (UNFRACTIONATED): HEPARIN UNFRACTIONATED: 0.38 [IU]/mL (ref 0.30–0.70)

## 2018-07-09 LAB — MAGNESIUM: Magnesium: 2.4 mg/dL (ref 1.7–2.4)

## 2018-07-09 MED ORDER — SODIUM CHLORIDE 0.9% FLUSH: 3.0000 mL | Freq: Two times a day (BID) | INTRAVENOUS | Status: DC

## 2018-07-09 MED ORDER — AMLODIPINE BESYLATE 10 MG PO TABS
10.0000 mg | ORAL_TABLET | Freq: Every day | ORAL | Status: DC
Start: 2018-07-09 — End: 2018-07-13
  Administered 2018-07-09 – 2018-07-13 (×5): 10 mg via ORAL
  Filled 2018-07-09 (×5): qty 1

## 2018-07-09 MED ORDER — ONDANSETRON HCL 4 MG/2ML IJ SOLN: 4.0000 mg | Freq: Four times a day (QID) | INTRAMUSCULAR | Status: DC | PRN

## 2018-07-09 MED ORDER — ACETAMINOPHEN 325 MG PO TABS: 650.0000 mg | ORAL_TABLET | ORAL | Status: DC | PRN

## 2018-07-09 MED ORDER — SODIUM CHLORIDE 0.9 % IV SOLN
INTRAVENOUS | Status: DC
  Administered 2018-07-10: 06:00:00 via INTRAVENOUS

## 2018-07-09 MED ORDER — MAGNESIUM OXIDE 400 (241.3 MG) MG PO TABS
400.0000 mg | ORAL_TABLET | Freq: Every day | ORAL | Status: DC
  Administered 2018-07-09: 400 mg via ORAL
  Filled 2018-07-09: qty 1

## 2018-07-09 MED ORDER — HEPARIN BOLUS VIA INFUSION
4000.0000 [IU] | Freq: Once | INTRAVENOUS | Status: AC
  Administered 2018-07-09: 4000 [IU] via INTRAVENOUS
  Filled 2018-07-09: qty 4000

## 2018-07-09 MED ORDER — SODIUM CHLORIDE 0.9% FLUSH: 3.0000 mL | INTRAVENOUS | Status: DC | PRN

## 2018-07-09 MED ORDER — RAMIPRIL 10 MG PO CAPS
10.0000 mg | ORAL_CAPSULE | Freq: Every day | ORAL | Status: DC
  Administered 2018-07-09 – 2018-07-13 (×5): 10 mg via ORAL
  Filled 2018-07-09 (×4): qty 4
  Filled 2018-07-09: qty 1

## 2018-07-09 MED ORDER — HEPARIN SODIUM (PORCINE) 5000 UNIT/ML IJ SOLN: 5000.0000 [IU] | Freq: Three times a day (TID) | INTRAMUSCULAR | Status: DC

## 2018-07-09 MED ORDER — HEPARIN (PORCINE) IN NACL 100-0.45 UNIT/ML-% IJ SOLN
1000.0000 [IU]/h | INTRAMUSCULAR | Status: DC
Start: 2018-07-09 — End: 2018-07-10
  Administered 2018-07-09 – 2018-07-10 (×2): 1000 [IU]/h via INTRAVENOUS
  Filled 2018-07-09 (×2): qty 250

## 2018-07-09 MED ORDER — ASPIRIN EC 81 MG PO TBEC
81.0000 mg | DELAYED_RELEASE_TABLET | Freq: Every day | ORAL | Status: DC
  Administered 2018-07-09 – 2018-07-11 (×4): 81 mg via ORAL
  Filled 2018-07-09 (×4): qty 1

## 2018-07-09 MED ORDER — METOPROLOL SUCCINATE ER 50 MG PO TB24
50.0000 mg | ORAL_TABLET | Freq: Every day | ORAL | Status: DC
  Administered 2018-07-09 – 2018-07-13 (×4): 50 mg via ORAL
  Filled 2018-07-09 (×5): qty 1

## 2018-07-09 MED ORDER — NITROGLYCERIN 0.4 MG SL SUBL: 0.4000 mg | SUBLINGUAL_TABLET | SUBLINGUAL | Status: DC | PRN

## 2018-07-09 MED ORDER — HYDROCHLOROTHIAZIDE 12.5 MG PO CAPS
12.5000 mg | ORAL_CAPSULE | Freq: Every day | ORAL | Status: DC
  Administered 2018-07-09: 12.5 mg via ORAL
  Filled 2018-07-09: qty 1

## 2018-07-09 MED ORDER — SODIUM CHLORIDE 0.9 % IV SOLN: 250.0000 mL | INTRAVENOUS | Status: DC | PRN

## 2018-07-09 MED ORDER — ASPIRIN 81 MG PO CHEW
81.0000 mg | CHEWABLE_TABLET | ORAL | Status: AC
  Administered 2018-07-10: 81 mg via ORAL
  Filled 2018-07-09: qty 1

## 2018-07-09 MED ORDER — ALPRAZOLAM 0.25 MG PO TABS
0.2500 mg | ORAL_TABLET | Freq: Two times a day (BID) | ORAL | Status: DC | PRN
  Administered 2018-07-09: 0.25 mg via ORAL
  Filled 2018-07-09 (×2): qty 1

## 2018-07-09 MED ORDER — ATORVASTATIN CALCIUM 40 MG PO TABS
40.0000 mg | ORAL_TABLET | Freq: Every day | ORAL | Status: DC
Start: 2018-07-09 — End: 2018-07-13
  Administered 2018-07-09 – 2018-07-12 (×4): 40 mg via ORAL
  Filled 2018-07-09 (×4): qty 1

## 2018-07-09 NOTE — Plan of Care (Signed)

## 2018-07-09 NOTE — Progress Notes (Signed)
[facility] No complaints of pain. SR on tel.  VSS. No Skin breakdown. Gen Plan of care initiated. Patient oriented to room and call system.

## 2018-07-09 NOTE — Progress Notes (Signed)
Patient admitted after midnight, please see H&P.  2nd troponin elevated but repeat back to normal.  Cardiology consult-- recent stress test low risk.  NPO until cardiology recommendations clear  Robert CanaryJessica Jamarr Treinen DO

## 2018-07-09 NOTE — H&P (Signed)
History and Physical    Robert [name] ZOX:[id number] DOB: [birth year] DOA: 07/08/2018  PCP: Georgann Housekeeper, MD   Patient coming from: Home   Chief Complaint: Chest pain   HPI: Robert [name] is a 77 y.o. male with medical history significant for CAD status post CABG more than 10 years ago, hypertension, and mild renal insufficiency, now presenting to the emergency department for evaluation of chest pain with mild shortness of breath.  Patient was experiencing episodes of chest pain several days ago and noted his heart rate to be as low as the 40s on home monitor.  He came into the ED, had reassuring evaluation, and the on-call cardiologist recommended reducing his beta-blocker which she has done.  He had a recurrent episode of substernal chest pressure yesterday afternoon with radiation to the left arm and mild dyspnea.  He took aspirin and nitroglycerin at home without any significant relief, but symptoms eventually subsided and there is no anginal complaint in the ED.  Patient denies any recent cough, fevers, or chills.  Of note, he had a stress test 1 month ago that was considered a low risk study.    ED Course: Upon arrival to the ED, patient is found to be afebrile, saturating well on room air, and with vitals otherwise normal.  EKG features a sinus rhythm with PVCs.  Chest x-ray is negative for edema or consolidation.  Chemistry panel is notable for creatinine 1.29, similar to prior.  CBC features a leukocytosis to 12,700 and troponin is undetectable.  Patient remains hemodynamically stable, in no apparent respiratory distress, and without chest pain at this time.  He will be observed for ongoing evaluation and management.  Review of Systems:  All other systems reviewed and apart from HPI, are negative.  Past Medical History:  Diagnosis Date  . CAD (coronary artery disease)    a. CABG X4 2008. b. [place]  . Fracture 1996   FOOT  . High cholesterol   . Hypertension   . Presence  of arterial stent 2016   Right subclavian artery    Past Surgical History:  Procedure Laterality Date  . cardiac stents    . CARDIAC SURGERY    . CORONARY ARTERY BYPASS GRAFT  2008  . PROSTATE SURGERY       reports that he has never smoked. He has never used smokeless tobacco. He reports that he drinks alcohol. He reports that he does not use drugs.  No Known Allergies  Family History  Problem Relation Age of Onset  . Healthy Mother        lived to be 52  . CAD Father 96  . Diabetes Brother   . Hypertension Brother   . CAD Brother 57       CABG     Prior to Admission medications   Medication Sig Start Date End Date Taking? Authorizing Provider  amLODipine (NORVASC) 10 MG tablet Take 10 mg by mouth daily.   Yes [provider]  aspirin 81 MG EC tablet Take 81 mg by mouth at bedtime. Swallow whole.    Yes [provider]  atorvastatin (LIPITOR) 40 MG tablet Take 40 mg by mouth daily.   Yes [provider]  hydrochlorothiazide (MICROZIDE) 12.5 MG capsule Take 12.5 mg by mouth daily.   Yes [provider]  Magnesium 200 MG TABS Take 400 mg by mouth daily with breakfast.    Yes [provider]  metoprolol succinate (TOPROL-XL) 50 MG 24  hr tablet Take 1 tablet (50 mg total) by mouth 2 (two) times daily. Take with or immediately following a meal. Patient taking differently: Take 50 mg by mouth daily. Take with or immediately following a meal. 07/05/18  Yes Tonny Bollmanooper, Michael, MD  nitroGLYCERIN (NITROSTAT) 0.4 MG SL tablet Place 0.4 mg under the tongue every 5 (five) minutes as needed for chest pain.  03/28/18  Yes [provider]  ramipril (ALTACE) 10 MG capsule Take 10 mg by mouth daily.   Yes [provider]    Physical Exam: Vitals:   07/08/18 2339 07/08/18 2345 07/09/18 0015 07/09/18 0045  BP: (!) 153/84 (!) 145/87 128/75 130/77  Pulse: 89 81 71 71  Resp: 13 13 13 14   Temp:      TempSrc:      SpO2: 96% 99% 99% 99%       Constitutional: NAD, calm  Eyes: PERTLA, lids and conjunctivae normal ENMT: Mucous membranes are moist. Posterior pharynx clear of any exudate or lesions.   Neck: normal, supple, no masses, no thyromegaly Respiratory: clear to auscultation bilaterally, no wheezing, no crackles. Normal respiratory effort.   Cardiovascular: S1 & S2 heard, regular rate and rhythm. No extremity edema. No significant JVD. Abdomen: No distension, no tenderness, soft. Bowel sounds normal.  Musculoskeletal: no clubbing / cyanosis. No joint deformity upper and lower extremities.    Skin: no significant rashes, lesions, ulcers. Warm, dry, well-perfused. Neurologic: CN 2-12 grossly intact. Sensation intact. Strength 5/5 in all 4 limbs.  Psychiatric:  Alert and oriented x 3. Calm, cooperative.    Labs on Admission: I have personally reviewed following labs and imaging studies  CBC: Recent Labs  Lab 07/05/18 0934 07/08/18 2110  WBC 11.7* 12.7*  HGB 16.3 16.7  HCT 50.4 50.4  MCV 94.4 92.5  PLT 346 341   Basic Metabolic Panel: Recent Labs  Lab 07/05/18 0934 07/08/18 2110  NA 138 137  K 4.0 4.4  CL 104 105  CO2 26 22  GLUCOSE 135* 139*  BUN 23 27*  CREATININE 1.25* 1.29*  CALCIUM 9.4 9.3  MG 2.5*  --    GFR: CrCl cannot be calculated (Unknown ideal weight.). Liver Function Tests: No results for input(s): AST, ALT, ALKPHOS, BILITOT, PROT, ALBUMIN in the last 168 hours. No results for input(s): LIPASE, AMYLASE in the last 168 hours. No results for input(s): AMMONIA in the last 168 hours. Coagulation Profile: No results for input(s): INR, PROTIME in the last 168 hours. Cardiac Enzymes: No results for input(s): CKTOTAL, CKMB, CKMBINDEX, TROPONINI in the last 168 hours. BNP (last 3 results) No results for input(s): PROBNP in the last 8760 hours. HbA1C: No results for input(s): HGBA1C in the last 72 hours. CBG: No results for input(s): GLUCAP in the last 168 hours. Lipid Profile: No  results for input(s): CHOL, HDL, LDLCALC, TRIG, CHOLHDL, LDLDIRECT in the last 72 hours. Thyroid Function Tests: No results for input(s): TSH, T4TOTAL, FREET4, T3FREE, THYROIDAB in the last 72 hours. Anemia Panel: No results for input(s): VITAMINB12, FOLATE, FERRITIN, TIBC, IRON, RETICCTPCT in the last 72 hours. Urine analysis:    Component Value Date/Time   COLORURINE YELLOW 07/05/2018 1241   APPEARANCEUR CLEAR 07/05/2018 1241   LABSPEC 1.019 07/05/2018 1241   PHURINE 6.0 07/05/2018 1241   GLUCOSEU NEGATIVE 07/05/2018 1241   HGBUR NEGATIVE 07/05/2018 1241   BILIRUBINUR NEGATIVE 07/05/2018 1241   KETONESUR NEGATIVE 07/05/2018 1241   PROTEINUR NEGATIVE 07/05/2018 1241   NITRITE NEGATIVE 07/05/2018 1241   LEUKOCYTESUR  NEGATIVE 07/05/2018 1241   Sepsis Labs: @LABRCNTIP (procalcitonin:4,lacticidven:4) )No results found for this or any previous visit (from the past 240 hour(s)).   Radiological Exams on Admission: Dg Chest 2 View  Result Date: 07/08/2018 CLINICAL DATA:  Chest pain and shortness of breath EXAM: CHEST - 2 VIEW COMPARISON:  July 05, 2018 FINDINGS: There is no appreciable edema or consolidation. Heart size and pulmonary vascularity are normal. No adenopathy. Patient is status post internal mammary bypass grafting. There is aortic atherosclerosis. No evident bone lesions. IMPRESSION: Postoperative changes. Aortic atherosclerosis. No edema or consolidation. Aortic Atherosclerosis (ICD10-I70.0). Electronically Signed   By: Bretta Bang III M.D.   On: 07/08/2018 21:35    EKG: Independently reviewed. Sinus rhythm, PVC's.   Assessment/Plan  1. Chest pain; CAD; bradycardia   - Presents with chest pain and mild SOB, did not improve with NTG at home, but has resolved by time of admission  - EKG with SR and PVC's, CXR without edema or infiltrate, and troponin undetectable in ED  - He had a stress test one month ago that was a low-risk study  - He reports HR in 40's recently on  home monitor and has reduced his beta-blocker at direction of on-call cardiology but has not yet been back in the clinic  - Transient arrhythmia or heart block considered; recent low-risk stress test reassuring  - Continue cardiac monitoring, check serial troponin, continue ASA, statin, ACE, and beta-blocker    2. Hypertension  - BP at goal  - Continue Norvasc, Toprol, and HCTZ   3. Mild renal insufficiency  - SCr is 1.29 on admission, similar to prior  - Renally-dose medications, avoid nephrotoxins     DVT prophylaxis: sq heparin  Code Status: Full  Family Communication: Son and wife updated at bedside Consults called: None Admission status: Observation     Briscoe Deutscher, MD [facility] Pager [phone]  If 7PM-7AM, please contact night-coverage [email or web address] Password [email or web address]  07/09/2018, 1:10 AM

## 2018-07-09 NOTE — Progress Notes (Signed)
ANTICOAGULATION CONSULT NOTE - Follow-Up Consult  Pharmacy Consult for heparin Indication: chest pain/ACS  No Known Allergies  Patient Measurements: Height: 5\' 9"  (175.3 cm) Weight: 177 lb 12.8 oz (80.6 kg) IBW/kg (Calculated) : 70.7 Heparin Dosing Weight: 80.6 Kg  Vital Signs: Temp: 98.1 F (36.7 C) (07/22 1945) Temp Source: Oral (07/22 1945) BP: 136/82 (07/22 1945) Pulse Rate: 78 (07/22 1945)  Labs: Recent Labs    07/08/18 2110 07/09/18 0110 07/09/18 0709 07/09/18 1309 07/09/18 1911  HGB 16.7  --   --   --   --   HCT 50.4  --   --   --   --   PLT 341  --   --   --   --   HEPARINUNFRC  --   --   --   --  0.38  CREATININE 1.29*  --  1.09  --   --   TROPONINI  --  <0.03 0.06* <0.03  --    Estimated Creatinine Clearance: 56.8 mL/min (by C-G formula based on SCr of 1.09 mg/dL).  Medical History: Past Medical History:  Diagnosis Date  . CAD (coronary artery disease)    a. CABG X4 2008. b. [place] ([place]  . Fracture 1996   FOOT  . High cholesterol   . Hypertension   . Leg hematoma 01/2018  . Presence of arterial stent 2016   Right subclavian artery  . Stenosis of subclavian artery (HCC)    a. s/p R subclavian stent 2016.   Medications:  Medications Prior to Admission  Medication Sig Dispense Refill Last Dose  . amLODipine (NORVASC) 10 MG tablet Take 10 mg by mouth daily.   07/08/2018 at Unknown time  . aspirin 81 MG EC tablet Take 81 mg by mouth at bedtime. Swallow whole.    07/08/2018 at 1830  . atorvastatin (LIPITOR) 40 MG tablet Take 40 mg by mouth daily.   07/08/2018 at Unknown time  . hydrochlorothiazide (MICROZIDE) 12.5 MG capsule Take 12.5 mg by mouth daily.   07/08/2018 at Unknown time  . Magnesium 200 MG TABS Take 400 mg by mouth daily with breakfast.    07/08/2018 at Unknown time  . metoprolol succinate (TOPROL-XL) 50 MG 24 hr tablet Take 1 tablet (50 mg total) by mouth 2 (two) times daily. Take with or immediately following a meal. (Patient taking  differently: Take 50 mg by mouth daily. Take with or immediately following a meal.) 90 tablet 3 07/08/2018 at 0700  . nitroGLYCERIN (NITROSTAT) 0.4 MG SL tablet Place 0.4 mg under the tongue every 5 (five) minutes as needed for chest pain.   1 unk  . ramipril (ALTACE) 10 MG capsule Take 10 mg by mouth daily.   07/08/2018 at Unknown time    Assessment:  Robert [name] with PMH of CABG ~10 years ago presenting with chest pain and SOB. No anticoagulation reported PTA. CBC WNL. Pharmacy to start heparin.  Initial heparin level therapeutic on 1000 units/hr.  Goal of Therapy:  Heparin level 0.3-0.7 units/ml Monitor platelets by anticoagulation protocol: Yes   Plan:  Continue heparin infusion at 1000 units/hr Confirmation anti-Xa level with AM labs. Continue to monitor H&H and platelets  Toys 'R' UsKimberly Terrea Bruster, Pharm.D., BCPS Clinical Pharmacist Pager: [phone] Clinical phone for 07/09/2018 is [phone].  **Pharmacist phone directory can now be found on [email or web address] (PW [email or web address]).  Listed under [facility].  07/09/2018 9:01 PM

## 2018-07-09 NOTE — Consult Note (Addendum)
Cardiology Consultation:   Patient ID: Robert [name]; [id number]; [birth year]   Admit date: 07/08/2018 Date of Consult: 07/09/2018  Primary Care Provider: Georgann Housekeeper, MD Primary Cardiologist: Robert Bollman, MD  Chief Complaint: chest pain  Patient Profile:   Robert [name] is a 77 y.o. male with a hx of CAD s/p CABG 2008, right subclavian stent 2016 (on Plavix), HTN, HLD  who is being seen today for the evaluation of chest pain at the request of Dr. Benjamine [name].  History of Present Illness:   He recently was seen in the office by Robert Leyden NP June 2019 to establish care. He has moved here from [place] in September 2018.   Per Robert [name]'s notes, he underwent CABG X 4 after being evaluated due to his brother needing bypass surgery. He denied ever having had symptoms or a heart attack. Since then he has had an MI and stent several years later, details unclear and records unavailable. The patient does not remember having specific anginal symptoms before this either. Apparently most recently his prior cardiologist wanted to do a stress test since his CABG was 10 years ago but the patient deferred and wanted to wait until he moved. He was totally asymptomatic at the time of that visit 05/25/18. He underwent nuclear stress test 06/06/18 which showed large size, moderate intensity fixed inferoseptal and inferolateral perfusion defects, worse at rest than stress, suspicious for artifact, no reversible ischemia, EF 70% with normal wall motion, low risk study. He exercised 4 mins and had PACs, PVCs in recovery. When asked today about what made him stop, he reports he couldn't go longer because he was "tired." Of note, he had a history of spontaneous calf hematoma in 01/2018 so the plan was that if stress test was normal, would be OK to stop Plavix.  He believes he's had intermittent chest pain for the past month. It is vague in nature, occurring about once a day at rest. He was previously riding a recumbent bike or  walking on a treadmill recently but has not done so recently because he was afraid to exercise given the chest pain. It wasn't that he was having angina with exertion, he just didn't want to press his luck. Earlier this month he called into the office reporting increased dyspnea on exertion and intermittent HR variation to the 40s on home monitor. His Metoprolol was reduced from 50mg  TID (?) to BID. He was seen in the ER for evaluation of chest pressure, lightheadedness, and again home HR in the 40s. In the ER he was noted to have NSR in 60s-70s albeit frequent PVCs. Metoprolol was reduced per conversation with Dr. Tenny [name] to 50mg  daily, and amlodipine dose decreased as well due to lightheadedness. The chest pressure resolved without intervention. He was sent home with recommendation for OP follow-up. He returned to the hospital yesterday evening with another episode of chest pressure and tingling in L arm with dyspnea. SL NTG did not help. The chest discomfort waxed and waned for several hours, prompting him to seek care. Symptoms spontaneously abated in the ER and he remains chest pain free. Telemetry shows NSR with frequent PVCs. He states his PCP mentioned this a few months ago but he had otherwise never heard of having these.  Labs show neg POC trop then neg regular trop, then 0.06, then neg. BUN/Cr 27/1.29->25/1.09, WBC 12.7, otherwise normal. He was just seen in the ER 7/18 at which time WBC was elevated, BNP WNL. CXR post CABG changes but otherwise nonacute.  VSS.   Past Medical History:  Diagnosis Date  . CAD (coronary artery disease)    a. CABG X4 2008. b. MI and stent (in [place])  . CKD (chronic kidney disease), stage II   . Fracture 1996   FOOT  . High cholesterol   . Hypertension   . Leg hematoma 01/2018  . Presence of arterial stent 2016   Right subclavian artery  . Stenosis of subclavian artery (HCC)    a. s/p R subclavian stent 2016.    Past Surgical History:  Procedure Laterality Date    . cardiac stents    . CARDIAC SURGERY    . CORONARY ARTERY BYPASS GRAFT  2008  . PROSTATE SURGERY       Inpatient Medications: Scheduled Meds: . amLODipine  10 mg Oral Daily  . aspirin EC  81 mg Oral QHS  . atorvastatin  40 mg Oral q1800  . metoprolol succinate  50 mg Oral Daily  . ramipril  10 mg Oral Daily   Continuous Infusions: . heparin 1,000 Units/hr (07/09/18 1219)   PRN Meds: acetaminophen, ALPRAZolam, nitroGLYCERIN, ondansetron (ZOFRAN) IV  Home Meds: Prior to Admission medications   Medication Sig Start Date End Date Taking? Authorizing Provider  amLODipine (NORVASC) 10 MG tablet Take 10 mg by mouth daily.   Yes [provider]  aspirin 81 MG EC tablet Take 81 mg by mouth at bedtime. Swallow whole.    Yes [provider]  atorvastatin (LIPITOR) 40 MG tablet Take 40 mg by mouth daily.   Yes [provider]  hydrochlorothiazide (MICROZIDE) 12.5 MG capsule Take 12.5 mg by mouth daily.   Yes [provider]  Magnesium 200 MG TABS Take 400 mg by mouth daily with breakfast.    Yes [provider]  metoprolol succinate (TOPROL-XL) 50 MG 24 hr tablet Take 1 tablet (50 mg total) by mouth 2 (two) times daily. Take with or immediately following a meal. Patient taking differently: Take 50 mg by mouth daily. Take with or immediately following a meal. 07/05/18  Yes Robert Bollmanooper, Michael, MD  nitroGLYCERIN (NITROSTAT) 0.4 MG SL tablet Place 0.4 mg under the tongue every 5 (five) minutes as needed for chest pain.  03/28/18  Yes [provider]  ramipril (ALTACE) 10 MG capsule Take 10 mg by mouth daily.   Yes [provider]    Allergies:   No Known Allergies  Social History:   Social History   Socioeconomic History  . Marital status: Married    Spouse name: Not on file  . Number of children: 2  . Years of education: COLLEGE  . Highest education level: Not on file  Occupational History  . Occupation: RETIRED  Social  Needs  . Financial resource strain: Not on file  . Food insecurity:    Worry: Not on file    Inability: Not on file  . Transportation needs:    Medical: Not on file    Non-medical: Not on file  Tobacco Use  . Smoking status: Never Smoker  . Smokeless tobacco: Never Used  Substance and Sexual Activity  . Alcohol use: Yes  . Drug use: No  . Sexual activity: Not on file  Lifestyle  . Physical activity:    Days per week: Not on file    Minutes per session: Not on file  . Stress: Not on file  Relationships  . Social connections:    Talks on phone: Not on file    Gets together: Not  on file    Attends religious service: Not on file    Active member of club or organization: Not on file    Attends meetings of clubs or organizations: Not on file    Relationship status: Not on file  . Intimate partner violence:    Fear of current or ex partner: Not on file    Emotionally abused: Not on file    Physically abused: Not on file    Forced sexual activity: Not on file  Other Topics Concern  . Not on file  Social History Narrative  . Not on file    Family History:   The patient's family history includes CAD (age of onset: 56) in his father; CAD (age of onset: 65) in his brother; Diabetes in his brother; Healthy in his mother; Hypertension in his brother.  ROS:  Please see the history of present illness.  All other ROS reviewed and negative.     Physical Exam/Data:   Vitals:   07/09/18 0311 07/09/18 0318 07/09/18 0749 07/09/18 1241  BP:  (!) 150/72 137/89 112/75  Pulse:  89 82 83  Resp:  18 18 18   Temp:  98 F (36.7 C) 97.7 F (36.5 C) 98.4 F (36.9 C)  TempSrc:  Oral Oral Oral  SpO2:  98% 99% 96%  Weight: 177 lb 12.8 oz (80.6 kg)     Height: 5\' 9"  (1.753 m)       Intake/Output Summary (Last 24 hours) at 07/09/2018 1513 Last data filed at 07/09/2018 1456 Gross per 24 hour  Intake 600 ml  Output 700 ml  Net -100 ml   Filed Weights   07/09/18 0311  Weight: 177 lb 12.8  oz (80.6 kg)   Body mass index is 26.26 kg/m.  General: Well developed, well nourished WM in no acute distress. Head: Normocephalic, atraumatic, sclera non-icteric, no xanthomas, nares are without discharge.  Neck: Negative for carotid bruits. JVD not elevated. Lungs: Clear bilaterally to auscultation without wheezes, rales, or rhonchi. Breathing is unlabored. Heart: RRR occ ectopy with S1 S2. No murmurs, rubs, or gallops appreciated. Abdomen: Soft, non-tender, non-distended with normoactive bowel sounds. No hepatomegaly. No rebound/guarding. No obvious abdominal masses. Msk:  Strength and tone appear normal for age. Extremities: No clubbing or cyanosis. No edema.  Distal pedal pulses are 2+ and equal bilaterally. Neuro: Alert and oriented X 3. No facial asymmetry. No focal deficit. Moves all extremities spontaneously. Psych:  Responds to questions appropriately with a normal affect.  EKG:  The EKG was personally reviewed and demonstrates NSR frequent PVCs subtle ST sagging I, avL  Relevant CV Studies: As above.  Laboratory Data:  Chemistry Recent Labs  Lab 07/05/18 0934 07/08/18 2110 07/09/18 0709  NA 138 137 141  K 4.0 4.4 3.6  CL 104 105 107  CO2 26 22 25   GLUCOSE 135* 139* 83  BUN 23 27* 25*  CREATININE 1.25* 1.29* 1.09  CALCIUM 9.4 9.3 9.1  GFRNONAA 54* 52* >60  GFRAA >60 >60 >60  ANIONGAP 8 10 9     No results for input(s): PROT, ALBUMIN, AST, ALT, ALKPHOS, BILITOT in the last 168 hours. Hematology Recent Labs  Lab 07/05/18 0934 07/08/18 2110  WBC 11.7* 12.7*  RBC 5.34 5.45  HGB 16.3 16.7  HCT 50.4 50.4  MCV 94.4 92.5  MCH 30.5 30.6  MCHC 32.3 33.1  RDW 13.6 13.5  PLT 346 341   Cardiac Enzymes Recent Labs  Lab 07/09/18 0110 07/09/18 0709 07/09/18 1309  TROPONINI <  0.03 0.06* <0.03    Recent Labs  Lab 07/05/18 1001 07/08/18 2121  TROPIPOC 0.00 0.00    BNP Recent Labs  Lab 07/05/18 0934  BNP 44.9    DDimer No results for input(s): DDIMER  in the last 168 hours.  Radiology/Studies:  Dg Chest 2 View  Result Date: 07/08/2018 CLINICAL DATA:  Chest pain and shortness of breath EXAM: CHEST - 2 VIEW COMPARISON:  July 05, 2018 FINDINGS: There is no appreciable edema or consolidation. Heart size and pulmonary vascularity are normal. No adenopathy. Patient is status post internal mammary bypass grafting. There is aortic atherosclerosis. No evident bone lesions. IMPRESSION: Postoperative changes. Aortic atherosclerosis. No edema or consolidation. Aortic Atherosclerosis (ICD10-I70.0). Electronically Signed   By: Bretta Bang III M.D.   On: 07/08/2018 21:35    Assessment and Plan:   1. Chest discomfort with known CAD s/p CABG - mixed atypical/typical features. Has been present for a month. Nonischemic nuc recently, but this was while asymptomatic. His exercise tolerance was fairly reduced at that time despite reports of regular exercise. This is his second presentation in a week to the emergency department for such. One of his troponins is abnormal but it is not clear if this is a spurious value as the follow-up lab was normal. He was started on heparin by primary team. Contiue ASA, BB, amlodipine, statin. Per discussion with Dr. Clifton James will plan definitive cath tomorrow. I asked pt to find out from his wife where his CABG took place and to let the nurse know so they can communicate with the cath lab to get records if possible. Dr. Clifton James discussed risks/benefits with the patient who is in agreement.  2. Recent ?bradycardia / documented frequent PVCs - HR reported to be in 40s at home, but 60s-70s in the ER and 80s here with frequent PVCs. May represent pseudobradycardia in the setting of ectopy. Can consider 48-hour Holter as OP to quantify PVCs and follow HR. Continue BB at present dose. Mg OK, K ranging 3.6-4.4. Follow.  3. Renal insufficiency (mild) - unknown duration, may represent CKD II. Improved today. Follow. IV hydration in AM. OK  to hydrate with clear liquids until 5am.  4. Essential HTN - BB and amlodipine recently reduced due to lightheadness. BP is indeed labile at times so will not make any changes (highest 153/84, lowest 101/46). Can consider reducing the ACEI if BP room is needed to titrate antianginals.  5. Leukocytosis - ? Etiology. No prior to compare to. No infective sx. Will need OP f/u by PCP.  For questions or updates, please contact [facility] Please consult [email or web address] for contact info under Cardiology/STEMI.    Signed, Laurann Montana, PA-C  07/09/2018 3:13 PM   I have personally seen and examined this patient. I agree with the assessment and plan as outlined above.  He has a history of CAD with prior CABG. He has been having exertional chest pain. This is his second visit to the ED over the past week. EKG without ischemia changes. I have personally reviewed the EKG. Troponin negative, then 0.06 then negative. This is likely a lab error.  No active chest pain at rest. Recent nuclear stress test done in setting of no chest pain showed likely artifact but he only exercised for 4 minutes.   My exam:  General: Well developed, well nourished, NAD  HEENT: OP clear, mucus membranes moist  SKIN: warm, dry. No rashes. Neuro: No focal deficits  Musculoskeletal: Muscle strength 5/5 all ext  Psychiatric: Mood and affect normal  Neck: No JVD, no carotid bruits, no thyromegaly, no lymphadenopathy.  Lungs:Clear bilaterally, no wheezes, rhonci, crackles Cardiovascular: Regular rate and rhythm. No murmurs, gallops or rubs. Abdomen:Soft. Bowel sounds present. Non-tender.  Extremities: No lower extremity edema. Pulses are 2 + in the bilateral DP/PT.  Plan: CAD s/p CABG with unstable angina: I think a cardiac cath is indicated. Will plan NPO at [facility] and cath tomorrow.  I have reviewed the risks, indications, and alternatives to cardiac catheterization, possible angioplasty, and stenting with the patient. Risks  include but are not limited to bleeding, infection, vascular injury, stroke, myocardial infection, arrhythmia, kidney injury, radiation-related injury in the case of prolonged fluoroscopy use, emergency cardiac surgery, and death. The patient understands the risks of serious complication is 1-2 in 1000 with diagnostic cardiac cath and 1-2% or less with angioplasty/stenting. He agrees to proceed.   Verne Carrow 07/09/2018 3:43 PM

## 2018-07-09 NOTE — ED Provider Notes (Signed)
[facility] EMERGENCY [facility] Provider Note   CSN: [id number] Arrival date & time: 07/08/18  2048     History   Chief Complaint Chief Complaint  Patient presents with  . Chest Pain  . Shortness of Breath    HPI Robert [name] is a 77 y.o. male.  Patient presents to the emergency department for evaluation of shortness of breath, chest pain radiating to the left arm.  Patient reports that he was in the emergency department several days ago with palpitations and intermittently low heartbeat.  He was counseled to decrease his Lopressor which he has done.  He had previously been on 150 mg daily.  He has now taking 50 mg daily.  He is still experiencing times when he is counting his pulse down into the 40s.  He has now, however, started having the tightness and heaviness in the chest.  He does have a history of coronary artery disease, status post MI, stent and bypass.     Past Medical History:  Diagnosis Date  . CAD (coronary artery disease)    a. CABG X4 2008. b. MI and stent (in [place])  . Fracture 1996   FOOT  . High cholesterol   . Hypertension   . Presence of arterial stent 2016   Right subclavian artery    Patient Active Problem List   Diagnosis Date Noted  . CAD (coronary artery disease) 05/25/2018  . Essential (primary) hypertension 05/25/2018  . Hyperlipidemia LDL goal <70 05/25/2018    Past Surgical History:  Procedure Laterality Date  . cardiac stents    . CARDIAC SURGERY    . CORONARY ARTERY BYPASS GRAFT  2008  . PROSTATE SURGERY          Home Medications    Prior to Admission medications   Medication Sig Start Date End Date Taking? Authorizing Provider  amLODipine (NORVASC) 10 MG tablet Take 10 mg by mouth daily.    [provider]  aspirin 81 MG EC tablet Take 81 mg by mouth at bedtime. Swallow whole.     [provider]  atorvastatin (LIPITOR) 40 MG tablet Take 40 mg by mouth daily.    [provider]    hydrochlorothiazide (MICROZIDE) 12.5 MG capsule Take 12.5 mg by mouth daily.    [provider]  Magnesium 200 MG TABS Take 400 mg by mouth daily with breakfast.     [provider]  metoprolol succinate (TOPROL-XL) 50 MG 24 hr tablet Take 1 tablet (50 mg total) by mouth 2 (two) times daily. Take with or immediately following a meal. 07/05/18   Tonny Bollmanooper, Michael, MD  nitroGLYCERIN (NITROSTAT) 0.4 MG SL tablet Place 1 tablet under the tongue as needed. 03/28/18   [provider]  ramipril (ALTACE) 10 MG capsule Take 10 mg by mouth daily.    [provider]    Family History Family History  Problem Relation Age of Onset  . Healthy Mother        lived to be 3499  . CAD Father 4448  . Diabetes Brother   . Hypertension Brother   . CAD Brother 2472       CABG    Social History Social History   Tobacco Use  . Smoking status: Never Smoker  . Smokeless tobacco: Never Used  Substance Use Topics  . Alcohol use: Yes  . Drug use: No     Allergies   Patient has no known allergies.   Review of Systems  Review of Systems  Respiratory: Positive for shortness of breath.   Cardiovascular: Positive for chest pain and palpitations.  All other systems reviewed and are negative.    Physical Exam Updated Vital Signs BP (!) 145/87   Pulse 81   Temp 98.1 F (36.7 C) (Oral)   Resp 13   SpO2 99%   Physical Exam  Constitutional: He is oriented to person, place, and time. He appears well-developed and well-nourished. No distress.  HENT:  Head: Normocephalic and atraumatic.  Right Ear: Hearing normal.  Left Ear: Hearing normal.  Nose: Nose normal.  Mouth/Throat: Oropharynx is clear and moist and mucous membranes are normal.  Eyes: Pupils are equal, round, and reactive to light. Conjunctivae and EOM are normal.  Neck: Normal range of motion. Neck supple.  Cardiovascular: Regular rhythm, S1 normal and S2 normal. Exam reveals no gallop and no friction rub.  No  murmur heard. Pulmonary/Chest: Effort normal and breath sounds normal. No respiratory distress. He exhibits no tenderness.  Abdominal: Soft. Normal appearance and bowel sounds are normal. There is no hepatosplenomegaly. There is no tenderness. There is no rebound, no guarding, no tenderness at McBurney's point and negative Murphy's sign. No hernia.  Musculoskeletal: Normal range of motion.  Neurological: He is alert and oriented to person, place, and time. He has normal strength. No cranial nerve deficit or sensory deficit. Coordination normal. GCS eye subscore is 4. GCS verbal subscore is 5. GCS motor subscore is 6.  Skin: Skin is warm, dry and intact. No rash noted. No cyanosis.  Psychiatric: He has a normal mood and affect. His speech is normal and behavior is normal. Thought content normal.  Nursing note and vitals reviewed.    ED Treatments / Results  Labs (all labs ordered are listed, but only abnormal results are displayed) Labs Reviewed  BASIC METABOLIC PANEL - Abnormal; Notable for the following components:      Result Value   Glucose, Bld 139 (*)    BUN 27 (*)    Creatinine, Ser 1.29 (*)    GFR calc non Af Amer 52 (*)    All other components within normal limits  CBC - Abnormal; Notable for the following components:   WBC 12.7 (*)    All other components within normal limits  I-STAT TROPONIN, ED    EKG EKG Interpretation  Date/Time:  Sunday July 08 2018 20:59:09 EDT Ventricular Rate:  90 PR Interval:  140 QRS Duration: 76 QT Interval:  356 QTC Calculation: 435 R Axis:   13 Text Interpretation:  Sinus rhythm with occasional Premature ventricular complexes Cannot rule out Anterior infarct , age undetermined Abnormal ECG Confirmed by Gilda Crease [phone]) on 07/08/2018 11:45:35 PM   Radiology Dg Chest 2 View  Result Date: 07/08/2018 CLINICAL DATA:  Chest pain and shortness of breath EXAM: CHEST - 2 VIEW COMPARISON:  July 05, 2018 FINDINGS: There is no  appreciable edema or consolidation. Heart size and pulmonary vascularity are normal. No adenopathy. Patient is status post internal mammary bypass grafting. There is aortic atherosclerosis. No evident bone lesions. IMPRESSION: Postoperative changes. Aortic atherosclerosis. No edema or consolidation. Aortic Atherosclerosis (ICD10-I70.0). Electronically Signed   By: Bretta Bang III M.D.   On: 07/08/2018 21:35    Procedures Procedures (including critical care time)  Medications Ordered in ED Medications - No data to display   Initial Impression / Assessment and Plan / ED Course  I have reviewed the triage vital signs and the nursing notes.  Pertinent  labs & imaging results that were available during my care of the patient were reviewed by me and considered in my medical decision making (see chart for details).     Patient's heart rate is in the 80s with PVCs.  This is likely some of the palpitations that he is experiencing.  He is still, however, reporting heart rates that dropped down into the 40s, despite taking a third of the dose of the Lopressor.  In addition, he is now experiencing chest pain.  As he has a history of previous MI, stenting and bypass in 2008, will require observation for telemetry monitoring as well as cardiac rule out.  Final Clinical Impressions(s) / ED Diagnoses   Final diagnoses:  Chest pain, unspecified type  Palpitations    ED Discharge Orders    None       Gilda Crease, MD 07/09/18 [id number]

## 2018-07-09 NOTE — Progress Notes (Signed)
ANTICOAGULATION CONSULT NOTE - Initial Consult  Pharmacy Consult for heparin Indication: chest pain/ACS  No Known Allergies  Patient Measurements: Height: 5\' 9"  (175.3 cm) Weight: 177 lb 12.8 oz (80.6 kg) IBW/kg (Calculated) : 70.7 Heparin Dosing Weight: 80.6 Kg  Vital Signs: Temp: 97.7 F (36.5 C) (07/22 0749) Temp Source: Oral (07/22 0749) BP: 137/89 (07/22 0749) Pulse Rate: 82 (07/22 0749)  Labs: Recent Labs    07/08/18 2110 07/09/18 0110 07/09/18 0709  HGB 16.7  --   --   HCT 50.4  --   --   PLT 341  --   --   CREATININE 1.29*  --  1.09  TROPONINI  --  <0.03 0.06*   Estimated Creatinine Clearance: 56.8 mL/min (by C-G formula based on SCr of 1.09 mg/dL).  Medical History: Past Medical History:  Diagnosis Date  . CAD (coronary artery disease)    a. CABG X4 2008. b. MI and stent (in [place])  . Fracture 1996   FOOT  . High cholesterol   . Hypertension   . Presence of arterial stent 2016   Right subclavian artery   Medications:  Medications Prior to Admission  Medication Sig Dispense Refill Last Dose  . amLODipine (NORVASC) 10 MG tablet Take 10 mg by mouth daily.   07/08/2018 at Unknown time  . aspirin 81 MG EC tablet Take 81 mg by mouth at bedtime. Swallow whole.    07/08/2018 at 1830  . atorvastatin (LIPITOR) 40 MG tablet Take 40 mg by mouth daily.   07/08/2018 at Unknown time  . hydrochlorothiazide (MICROZIDE) 12.5 MG capsule Take 12.5 mg by mouth daily.   07/08/2018 at Unknown time  . Magnesium 200 MG TABS Take 400 mg by mouth daily with breakfast.    07/08/2018 at Unknown time  . metoprolol succinate (TOPROL-XL) 50 MG 24 hr tablet Take 1 tablet (50 mg total) by mouth 2 (two) times daily. Take with or immediately following a meal. (Patient taking differently: Take 50 mg by mouth daily. Take with or immediately following a meal.) 90 tablet 3 07/08/2018 at 0700  . nitroGLYCERIN (NITROSTAT) 0.4 MG SL tablet Place 0.4 mg under the tongue every 5 (five) minutes as needed  for chest pain.   1 unk  . ramipril (ALTACE) 10 MG capsule Take 10 mg by mouth daily.   07/08/2018 at Unknown time    Assessment: Robert [name] with PMH of CABG ~10 years ago presenting with chest pain and SOB. No anticoagulation reported PTA. CBC WNL. Pharmacy to start heparin.  Goal of Therapy:  Heparin level 0.3-0.7 units/ml Monitor platelets by anticoagulation protocol: Yes   Plan:  Give 4000 units bolus x 1 Start heparin infusion at 1000 units/hr Check anti-Xa level in 6 hours and daily while on heparin Continue to monitor H&H and platelets  Ruben Imony Malikah Principato, PharmD Clinical Pharmacist 07/09/2018 11:43 AM Please check AMION for all [facility] numbers

## 2018-07-10 ENCOUNTER — Inpatient Hospital Stay (HOSPITAL_COMMUNITY)
Admission: EM | Disposition: A | Discharge status: Home / Self Care | Payer: Self-pay | Source: Home / Self Care | Attending: Internal Medicine

## 2018-07-10 DIAGNOSIS — I493 Ventricular premature depolarization: Secondary | ICD-10-CM | POA: Diagnosis present

## 2018-07-10 DIAGNOSIS — Z951 Presence of aortocoronary bypass graft: Secondary | ICD-10-CM | POA: Diagnosis not present

## 2018-07-10 DIAGNOSIS — I129 Hypertensive chronic kidney disease with stage 1 through stage 4 chronic kidney disease, or unspecified chronic kidney disease: Secondary | ICD-10-CM | POA: Diagnosis present

## 2018-07-10 DIAGNOSIS — I25810 Atherosclerosis of coronary artery bypass graft(s) without angina pectoris: Secondary | ICD-10-CM | POA: Diagnosis present

## 2018-07-10 DIAGNOSIS — R002 Palpitations: Secondary | ICD-10-CM

## 2018-07-10 DIAGNOSIS — I200 Unstable angina: Secondary | ICD-10-CM | POA: Diagnosis not present

## 2018-07-10 DIAGNOSIS — I25110 Atherosclerotic heart disease of native coronary artery with unstable angina pectoris: Secondary | ICD-10-CM | POA: Diagnosis present

## 2018-07-10 DIAGNOSIS — I10 Essential (primary) hypertension: Secondary | ICD-10-CM | POA: Diagnosis not present

## 2018-07-10 DIAGNOSIS — T82855A Stenosis of coronary artery stent, initial encounter: Secondary | ICD-10-CM | POA: Diagnosis present

## 2018-07-10 DIAGNOSIS — R079 Chest pain, unspecified: Secondary | ICD-10-CM | POA: Diagnosis not present

## 2018-07-10 DIAGNOSIS — Z79899 Other long term (current) drug therapy: Secondary | ICD-10-CM | POA: Diagnosis not present

## 2018-07-10 DIAGNOSIS — Z7982 Long term (current) use of aspirin: Secondary | ICD-10-CM | POA: Diagnosis not present

## 2018-07-10 DIAGNOSIS — Y831 Surgical operation with implant of artificial internal device as the cause of abnormal reaction of the patient, or of later complication, without mention of misadventure at the time of the procedure: Secondary | ICD-10-CM | POA: Diagnosis present

## 2018-07-10 DIAGNOSIS — I252 Old myocardial infarction: Secondary | ICD-10-CM | POA: Diagnosis not present

## 2018-07-10 DIAGNOSIS — D72829 Elevated white blood cell count, unspecified: Secondary | ICD-10-CM | POA: Diagnosis present

## 2018-07-10 DIAGNOSIS — Z955 Presence of coronary angioplasty implant and graft: Secondary | ICD-10-CM | POA: Diagnosis not present

## 2018-07-10 DIAGNOSIS — N182 Chronic kidney disease, stage 2 (mild): Secondary | ICD-10-CM | POA: Diagnosis present

## 2018-07-10 DIAGNOSIS — E7800 Pure hypercholesterolemia, unspecified: Secondary | ICD-10-CM | POA: Diagnosis present

## 2018-07-10 HISTORY — PX: AORTIC ARCH ANGIOGRAPHY: CATH118224

## 2018-07-10 HISTORY — PX: LEFT HEART CATH AND CORS/GRAFTS ANGIOGRAPHY: CATH118250

## 2018-07-10 LAB — BASIC METABOLIC PANEL
Anion gap: 9 (ref 5–15)
BUN: 17 mg/dL (ref 8–23)
CHLORIDE: 106 mmol/L (ref 98–111)
CO2: 25 mmol/L (ref 22–32)
Calcium: 9.1 mg/dL (ref 8.9–10.3)
Creatinine, Ser: 1.21 mg/dL (ref 0.61–1.24)
GFR calc non Af Amer: 56 mL/min — ABNORMAL LOW (ref >60)
Glucose, Bld: 100 mg/dL — ABNORMAL HIGH (ref 70–99)
POTASSIUM: 3.9 mmol/L (ref 3.5–5.1)
SODIUM: 140 mmol/L (ref 135–145)

## 2018-07-10 LAB — CBC
HCT: 48.9 % (ref 39.0–52.0)
HEMOGLOBIN: 16.1 g/dL (ref 13.0–17.0)
MCH: 31 pg (ref 26.0–34.0)
MCHC: 32.9 g/dL (ref 30.0–36.0)
MCV: 94 fL (ref 78.0–100.0)
Platelets: 237 10*3/uL (ref 150–400)
RBC: 5.2 MIL/uL (ref 4.22–5.81)
RDW: 13.3 % (ref 11.5–15.5)
WBC: 8.5 10*3/uL (ref 4.0–10.5)

## 2018-07-10 LAB — HEPARIN LEVEL (UNFRACTIONATED): HEPARIN UNFRACTIONATED: 0.39 [IU]/mL (ref 0.30–0.70)

## 2018-07-10 LAB — LIPID PANEL
CHOL/HDL RATIO: 3.3 ratio
CHOLESTEROL: 110 mg/dL (ref 0–200)
HDL: 33 mg/dL — AB (ref >40)
LDL Cholesterol: 57 mg/dL (ref 0–99)
TRIGLYCERIDES: 100 mg/dL (ref <150)
VLDL: 20 mg/dL (ref 0–40)

## 2018-07-10 LAB — HEPATIC FUNCTION PANEL
ALT: 31 U/L (ref 0–44)
AST: 27 U/L (ref 15–41)
Albumin: 3.4 g/dL — ABNORMAL LOW (ref 3.5–5.0)
Alkaline Phosphatase: 54 U/L (ref 38–126)
BILIRUBIN DIRECT: 0.2 mg/dL (ref 0.0–0.2)
BILIRUBIN TOTAL: 0.7 mg/dL (ref 0.3–1.2)
Indirect Bilirubin: 0.5 mg/dL (ref 0.3–0.9)
Total Protein: 6 g/dL — ABNORMAL LOW (ref 6.5–8.1)

## 2018-07-10 LAB — GLUCOSE, CAPILLARY
GLUCOSE-CAPILLARY: 101 mg/dL — AB (ref 70–99)
GLUCOSE-CAPILLARY: 97 mg/dL (ref 70–99)
GLUCOSE-CAPILLARY: 120 mg/dL — AB (ref 70–99)
Glucose-Capillary: 103 mg/dL — ABNORMAL HIGH (ref 70–99)

## 2018-07-10 SURGERY — LEFT HEART CATH AND CORS/GRAFTS ANGIOGRAPHY
Anesthesia: LOCAL

## 2018-07-10 MED ORDER — HEPARIN SODIUM (PORCINE) 1000 UNIT/ML IJ SOLN
INTRAMUSCULAR | Status: DC | PRN
  Administered 2018-07-10: 4000 [IU] via INTRAVENOUS

## 2018-07-10 MED ORDER — VERAPAMIL HCL 2.5 MG/ML IV SOLN
INTRAVENOUS | Status: DC | PRN
  Administered 2018-07-10: 10 mL via INTRA_ARTERIAL

## 2018-07-10 MED ORDER — MIDAZOLAM HCL 2 MG/2ML IJ SOLN
INTRAMUSCULAR | Status: DC | PRN
  Administered 2018-07-10: 1 mg via INTRAVENOUS

## 2018-07-10 MED ORDER — VERAPAMIL HCL 2.5 MG/ML IV SOLN
INTRAVENOUS | Status: AC
  Filled 2018-07-10: qty 2

## 2018-07-10 MED ORDER — IOHEXOL 350 MG/ML SOLN
INTRAVENOUS | Status: DC | PRN
  Administered 2018-07-10: 80 mL

## 2018-07-10 MED ORDER — IOPAMIDOL (ISOVUE-370) INJECTION 76%
INTRAVENOUS | Status: AC
  Filled 2018-07-10: qty 125

## 2018-07-10 MED ORDER — HEPARIN (PORCINE) IN NACL 1000-0.9 UT/500ML-% IV SOLN
INTRAVENOUS | Status: AC
  Filled 2018-07-10: qty 1000

## 2018-07-10 MED ORDER — SODIUM CHLORIDE 0.9 % IV SOLN: 250.0000 mL | INTRAVENOUS | Status: DC | PRN

## 2018-07-10 MED ORDER — LIDOCAINE HCL (PF) 1 % IJ SOLN
INTRAMUSCULAR | Status: AC
  Filled 2018-07-10: qty 30

## 2018-07-10 MED ORDER — HEPARIN (PORCINE) IN NACL 1000-0.9 UT/500ML-% IV SOLN
INTRAVENOUS | Status: DC | PRN
  Administered 2018-07-10 (×2): 500 mL

## 2018-07-10 MED ORDER — HEPARIN (PORCINE) IN NACL 100-0.45 UNIT/ML-% IJ SOLN
1300.0000 [IU]/h | INTRAMUSCULAR | Status: DC
  Administered 2018-07-11: 1150 [IU]/h via INTRAVENOUS
  Administered 2018-07-11: 1000 [IU]/h via INTRAVENOUS
  Filled 2018-07-10: qty 250

## 2018-07-10 MED ORDER — IOPAMIDOL (ISOVUE-370) INJECTION 76%
INTRAVENOUS | Status: DC | PRN
  Administered 2018-07-10: 125 mL

## 2018-07-10 MED ORDER — HEPARIN SODIUM (PORCINE) 1000 UNIT/ML IJ SOLN
INTRAMUSCULAR | Status: AC
  Filled 2018-07-10: qty 1

## 2018-07-10 MED ORDER — SODIUM CHLORIDE 0.9 % IV SOLN
INTRAVENOUS | Status: AC
  Administered 2018-07-10 (×2): via INTRAVENOUS

## 2018-07-10 MED ORDER — FENTANYL CITRATE (PF) 100 MCG/2ML IJ SOLN
INTRAMUSCULAR | Status: AC
  Filled 2018-07-10: qty 2

## 2018-07-10 MED ORDER — FENTANYL CITRATE (PF) 100 MCG/2ML IJ SOLN
INTRAMUSCULAR | Status: DC | PRN
  Administered 2018-07-10: 25 ug via INTRAVENOUS

## 2018-07-10 MED ORDER — MIDAZOLAM HCL 2 MG/2ML IJ SOLN
INTRAMUSCULAR | Status: AC
  Filled 2018-07-10: qty 2

## 2018-07-10 MED ORDER — SODIUM CHLORIDE 0.9% FLUSH: 3.0000 mL | INTRAVENOUS | Status: DC | PRN

## 2018-07-10 MED ORDER — SODIUM CHLORIDE 0.9% FLUSH
3.0000 mL | Freq: Two times a day (BID) | INTRAVENOUS | Status: DC
Start: 2018-07-10 — End: 2018-07-13
  Administered 2018-07-12: 3 mL via INTRAVENOUS

## 2018-07-10 SURGICAL SUPPLY — 13 items
CATH INFINITI 5 FR IM (CATHETERS) ×2 IMPLANT
CATH INFINITI 5FR AL1 (CATHETERS) ×2 IMPLANT
CATH INFINITI 5FR MULTPACK ANG (CATHETERS) ×2 IMPLANT
CATH LAUNCHER 5F EBU3.5 (CATHETERS) ×2 IMPLANT
DEVICE RAD COMP TR BAND LRG (VASCULAR PRODUCTS) ×2 IMPLANT
GLIDESHEATH SLEND A-KIT 6F 22G (SHEATH) ×2 IMPLANT
GUIDEWIRE INQWIRE 1.5J.035X260 (WIRE) ×1 IMPLANT
INQWIRE 1.5J .035X260CM (WIRE) ×2
KIT HEART LEFT (KITS) ×2 IMPLANT
PACK CARDIAC CATHETERIZATION (CUSTOM PROCEDURE TRAY) ×2 IMPLANT
SYR MEDRAD MARK V 150ML (SYRINGE) ×2 IMPLANT
TRANSDUCER W/STOPCOCK (MISCELLANEOUS) ×2 IMPLANT
TUBING CIL FLEX 10 FLL-RA (TUBING) ×2 IMPLANT

## 2018-07-10 NOTE — Progress Notes (Signed)
Pt received from cath lab, tr band 15cc of air to left wrist and applied at 1847. Pt instructed on limited used and how TR band works, ns infusing at 7375ml/hr post cath, denies pain or sob, instructed on frequency of vital signs, to get up only with assistance due to tr band and risk of bleeding

## 2018-07-10 NOTE — H&P (View-Only) (Signed)
Progress Note  Patient Name: Francia GreavesRichard Decoteau Date of Encounter: 07/10/2018  Primary Cardiologist: Tonny BollmanMichael Cooper, MD  Subjective   Feeling well this morning. No chest pain. Planned for cath today.   Inpatient Medications    Scheduled Meds: . amLODipine  10 mg Oral Daily  . aspirin EC  81 mg Oral QHS  . atorvastatin  40 mg Oral q1800  . metoprolol succinate  50 mg Oral Daily  . ramipril  10 mg Oral Daily  . sodium chloride flush  3 mL Intravenous Q12H   Continuous Infusions: . sodium chloride    . sodium chloride 10 mL/hr at 07/10/18 0629  . heparin 1,000 Units/hr (07/10/18 0935)   PRN Meds: sodium chloride, acetaminophen, ALPRAZolam, nitroGLYCERIN, ondansetron (ZOFRAN) IV, sodium chloride flush   Vital Signs    Vitals:   07/09/18 2305 07/09/18 2351 07/10/18 0458 07/10/18 0801  BP: 120/60  122/82 132/71  Pulse: (!) 43 84 78 71  Resp: 16  16 18   Temp: 98.6 F (37 C)  97.9 F (36.6 C) 98.1 F (36.7 C)  TempSrc: Oral  Oral Oral  SpO2: 97%  97% 97%  Weight:   173 lb (78.5 kg)   Height:        Intake/Output Summary (Last 24 hours) at 07/10/2018 1027 Last data filed at 07/10/2018 0935 Gross per 24 hour  Intake 893.26 ml  Output 1475 ml  Net -581.74 ml   Filed Weights   07/09/18 0311 07/10/18 0458  Weight: 177 lb 12.8 oz (80.6 kg) 173 lb (78.5 kg)    Telemetry    SR - Personally Reviewed  ECG    SR - Personally Reviewed  Physical Exam   General: Well developed, well nourished, male appearing in no acute distress. Head: Normocephalic, atraumatic.  Neck: Supple without bruits, JVD. Lungs:  Resp regular and unlabored, CTA. Heart: RRR, S1, S2, no S3, S4, or murmur; no rub. Abdomen: Soft, non-tender, non-distended with normoactive bowel sounds.  Extremities: No clubbing, cyanosis, edema. Distal pedal pulses are 2+ bilaterally. Neuro: Alert and oriented X 3. Moves all extremities spontaneously. Psych: Normal affect.  Labs    Chemistry Recent Labs  Lab  07/08/18 2110 07/09/18 0709 07/10/18 0648  NA 137 141 140  K 4.4 3.6 3.9  CL 105 107 106  CO2 22 25 25   GLUCOSE 139* 83 100*  BUN 27* 25* 17  CREATININE 1.29* 1.09 1.21  CALCIUM 9.3 9.1 9.1  PROT  --   --  6.0*  ALBUMIN  --   --  3.4*  AST  --   --  27  ALT  --   --  31  ALKPHOS  --   --  54  BILITOT  --   --  0.7  GFRNONAA 52* >60 56*  GFRAA >60 >60 >60  ANIONGAP 10 9 9      Hematology Recent Labs  Lab 07/05/18 0934 07/08/18 2110 07/10/18 0648  WBC 11.7* 12.7* 8.5  RBC 5.34 5.45 5.20  HGB 16.3 16.7 16.1  HCT 50.4 50.4 48.9  MCV 94.4 92.5 94.0  MCH 30.5 30.6 31.0  MCHC 32.3 33.1 32.9  RDW 13.6 13.5 13.3  PLT 346 341 237    Cardiac Enzymes Recent Labs  Lab 07/09/18 0110 07/09/18 0709 07/09/18 1309  TROPONINI <0.03 0.06* <0.03    Recent Labs  Lab 07/05/18 1001 07/08/18 2121  TROPIPOC 0.00 0.00     BNP Recent Labs  Lab 07/05/18 0934  BNP 44.9  DDimer No results for input(s): DDIMER in the last 168 hours.    Radiology    Dg Chest 2 View  Result Date: 07/08/2018 CLINICAL DATA:  Chest pain and shortness of breath EXAM: CHEST - 2 VIEW COMPARISON:  July 05, 2018 FINDINGS: There is no appreciable edema or consolidation. Heart size and pulmonary vascularity are normal. No adenopathy. Patient is status post internal mammary bypass grafting. There is aortic atherosclerosis. No evident bone lesions. IMPRESSION: Postoperative changes. Aortic atherosclerosis. No edema or consolidation. Aortic Atherosclerosis (ICD10-I70.0). Electronically Signed   By: Bretta Bang III M.D.   On: 07/08/2018 21:35    Cardiac Studies   N/a   Patient Profile     77 y.o. male with a hx of CAD s/p CABG 2008, right subclavian stent 2016 (on Plavix), HTN, HLD  who was seen for the evaluation of chest pain.   Assessment & Plan    1. Chest discomfort with known CAD s/p CABG - mixed atypical/typical features. Has been present for a month. He remains on heparin by primary  team. Contiue ASA, BB, amlodipine, statin. Planned for cath today.   2. Recent ?bradycardia / documented frequent PVCs - HR reported to be in 40s at home, but 60s-70s in the ER and 80s here with frequent PVCs. Continue BB at present dose. Mg OK, K ranging 3.6-4.4. Follow.  3. Renal insufficiency (mild) - unknown duration, may represent CKD II. Improved today. Follow. IV hydration in AM. OK to hydrate with clear liquids until 5am.  4. Essential HTN - BB and amlodipine recently reduced due to lightheadness. Blood pressure stable with current medications.   5. Leukocytosis - now resolved.   6. HL: on statin, LDL 57  Signed, Laverda Page, NP  07/10/2018, 10:27 AM  Pager # [phone]   For questions or updates, please contact [facility] Please consult [email or web address] for contact info under Cardiology/STEMI.  I have personally seen and examined this patient. I agree with the assessment and plan as outlined above.  Plans for cardiac cath today. Labs reviewed and ok.   Verne Carrow 07/10/2018 10:41 AM

## 2018-07-10 NOTE — Progress Notes (Addendum)
 Progress Note  Patient Name: Robert [name] Date of Encounter: 07/10/2018  Primary Cardiologist: Michael Cooper, MD  Subjective   Feeling well this morning. No chest pain. Planned for cath today.   Inpatient Medications    Scheduled Meds: . amLODipine  10 mg Oral Daily  . aspirin EC  81 mg Oral QHS  . atorvastatin  40 mg Oral q1800  . metoprolol succinate  50 mg Oral Daily  . ramipril  10 mg Oral Daily  . sodium chloride flush  3 mL Intravenous Q12H   Continuous Infusions: . sodium chloride    . sodium chloride 10 mL/hr at 07/10/18 0629  . heparin 1,000 Units/hr (07/10/18 0935)   PRN Meds: sodium chloride, acetaminophen, ALPRAZolam, nitroGLYCERIN, ondansetron (ZOFRAN) IV, sodium chloride flush   Vital Signs    Vitals:   07/09/18 2305 07/09/18 2351 07/10/18 0458 07/10/18 0801  BP: 120/60  122/82 132/71  Pulse: (!) 43 84 78 71  Resp: 16  16 18  Temp: 98.6 F (37 C)  97.9 F (36.6 C) 98.1 F (36.7 C)  TempSrc: Oral  Oral Oral  SpO2: 97%  97% 97%  Weight:   173 lb (78.5 kg)   Height:        Intake/Output Summary (Last 24 hours) at 07/10/2018 1027 Last data filed at 07/10/2018 0935 Gross per 24 hour  Intake 893.26 ml  Output 1475 ml  Net -581.74 ml   Filed Weights   07/09/18 0311 07/10/18 0458  Weight: 177 lb 12.8 oz (80.6 kg) 173 lb (78.5 kg)    Telemetry    SR - Personally Reviewed  ECG    SR - Personally Reviewed  Physical Exam   General: Well developed, well nourished, male appearing in no acute distress. Head: Normocephalic, atraumatic.  Neck: Supple without bruits, JVD. Lungs:  Resp regular and unlabored, CTA. Heart: RRR, S1, S2, no S3, S4, or murmur; no rub. Abdomen: Soft, non-tender, non-distended with normoactive bowel sounds.  Extremities: No clubbing, cyanosis, edema. Distal pedal pulses are 2+ bilaterally. Neuro: Alert and oriented X 3. Moves all extremities spontaneously. Psych: Normal affect.  Labs    Chemistry Recent Labs  Lab  07/08/18 2110 07/09/18 0709 07/10/18 0648  NA 137 141 140  K 4.4 3.6 3.9  CL 105 107 106  CO2 22 25 25  GLUCOSE 139* 83 100*  BUN 27* 25* 17  CREATININE 1.29* 1.09 1.21  CALCIUM 9.3 9.1 9.1  PROT  --   --  6.0*  ALBUMIN  --   --  3.4*  AST  --   --  27  ALT  --   --  31  ALKPHOS  --   --  54  BILITOT  --   --  0.7  GFRNONAA 52* >60 56*  GFRAA >60 >60 >60  ANIONGAP 10 9 9     Hematology Recent Labs  Lab 07/05/18 0934 07/08/18 2110 07/10/18 0648  WBC 11.7* 12.7* 8.5  RBC 5.34 5.45 5.20  HGB 16.3 16.7 16.1  HCT 50.4 50.4 48.9  MCV 94.4 92.5 94.0  MCH 30.5 30.6 31.0  MCHC 32.3 33.1 32.9  RDW 13.6 13.5 13.3  PLT 346 341 237    Cardiac Enzymes Recent Labs  Lab 07/09/18 0110 07/09/18 0709 07/09/18 1309  TROPONINI <0.03 0.06* <0.03    Recent Labs  Lab 07/05/18 1001 07/08/18 2121  TROPIPOC 0.00 0.00     BNP Recent Labs  Lab 07/05/18 0934  BNP 44.9       DDimer No results for input(s): DDIMER in the last 168 hours.    Radiology    Dg Chest 2 View  Result Date: 07/08/2018 CLINICAL DATA:  Chest pain and shortness of breath EXAM: CHEST - 2 VIEW COMPARISON:  July 05, 2018 FINDINGS: There is no appreciable edema or consolidation. Heart size and pulmonary vascularity are normal. No adenopathy. Patient is status post internal mammary bypass grafting. There is aortic atherosclerosis. No evident bone lesions. IMPRESSION: Postoperative changes. Aortic atherosclerosis. No edema or consolidation. Aortic Atherosclerosis (ICD10-I70.0). Electronically Signed   By: William  Woodruff III M.D.   On: 07/08/2018 21:35    Cardiac Studies   N/a   Patient Profile     77 y.o. male with a hx of CAD s/p CABG 2008, right subclavian stent 2016 (on Plavix), HTN, HLD  who was seen for the evaluation of chest pain.   Assessment & Plan    1. Chest discomfort with known CAD s/p CABG - mixed atypical/typical features. Has been present for a month. He remains on heparin by primary  team. Contiue ASA, BB, amlodipine, statin. Planned for cath today.   2. Recent ?bradycardia / documented frequent PVCs - HR reported to be in 40s at home, but 60s-70s in the ER and 80s here with frequent PVCs. Continue BB at present dose. Mg OK, K ranging 3.6-4.4. Follow.  3. Renal insufficiency (mild) - unknown duration, may represent CKD II. Improved today. Follow. IV hydration in AM. OK to hydrate with clear liquids until 5am.  4. Essential HTN - BB and amlodipine recently reduced due to lightheadness. Blood pressure stable with current medications.   5. Leukocytosis - now resolved.   6. HL: on statin, LDL 57  Signed, Lindsay Roberts, NP  07/10/2018, 10:27 AM  Pager # [phone]   For questions or updates, please contact [facility] Please consult [email or web address] for contact info under Cardiology/STEMI.  I have personally seen and examined this patient. I agree with the assessment and plan as outlined above.  Plans for cardiac cath today. Labs reviewed and ok.   Deshunda Thackston 07/10/2018 10:41 AM      

## 2018-07-10 NOTE — Progress Notes (Signed)
ANTICOAGULATION CONSULT NOTE - Follow-Up Consult  Pharmacy Consult for heparin Indication: chest pain/ACS  No Known Allergies  Patient Measurements: Height: 5\' 9"  (175.3 cm) Weight: 173 lb (78.5 kg) IBW/kg (Calculated) : 70.7 Heparin Dosing Weight: 80.6 Kg  Vital Signs: Temp: 98.1 F (36.7 C) (07/23 1955) Temp Source: Oral (07/23 1955) BP: 119/84 (07/23 2030) Pulse Rate: 60 (07/23 2030)  Labs: Recent Labs    07/08/18 2110 07/09/18 0110 07/09/18 0709 07/09/18 1309 07/09/18 1911 07/10/18 0648  HGB 16.7  --   --   --   --  16.1  HCT 50.4  --   --   --   --  48.9  PLT 341  --   --   --   --  237  HEPARINUNFRC  --   --   --   --  0.38 0.39  CREATININE 1.29*  --  1.09  --   --  1.21  TROPONINI  --  <0.03 0.06* <0.03  --   --    Estimated Creatinine Clearance: 51.1 mL/min (by C-G formula based on SCr of 1.21 mg/dL).  Medical History: Past Medical History:  Diagnosis Date  . CAD (coronary artery disease)    a. CABG X4 2008. b. MI and stent (in [place])  . Fracture 1996   FOOT  . High cholesterol   . Hypertension   . Leg hematoma 01/2018  . Presence of arterial stent 2016   Right subclavian artery  . Stenosis of subclavian artery (HCC)    a. s/p R subclavian stent 2016.   Medications:  Medications Prior to Admission  Medication Sig Dispense Refill Last Dose  . amLODipine (NORVASC) 10 MG tablet Take 10 mg by mouth daily.   07/08/2018 at Unknown time  . aspirin 81 MG EC tablet Take 81 mg by mouth at bedtime. Swallow whole.    07/08/2018 at 1830  . atorvastatin (LIPITOR) 40 MG tablet Take 40 mg by mouth daily.   07/08/2018 at Unknown time  . hydrochlorothiazide (MICROZIDE) 12.5 MG capsule Take 12.5 mg by mouth daily.   07/08/2018 at Unknown time  . Magnesium 200 MG TABS Take 400 mg by mouth daily with breakfast.    07/08/2018 at Unknown time  . metoprolol succinate (TOPROL-XL) 50 MG 24 hr tablet Take 1 tablet (50 mg total) by mouth 2 (two) times daily. Take with or immediately  following a meal. (Patient taking differently: Take 50 mg by mouth daily. Take with or immediately following a meal.) 90 tablet 3 07/08/2018 at 0700  . nitroGLYCERIN (NITROSTAT) 0.4 MG SL tablet Place 0.4 mg under the tongue every 5 (five) minutes as needed for chest pain.   1 unk  . ramipril (ALTACE) 10 MG capsule Take 10 mg by mouth daily.   07/08/2018 at Unknown time    Assessment:  77yo M with PMH of CABG ~10 years ago presenting with chest pain and SOB. No anticoagulation reported PTA.  Patient is now s/p cath.  MDs considering repeat PCI vs CABG.  Heparin to restart 8 hours after sheath pull.  Sheath pulled and TR band placed at 1845.  Pt was previously therapeutic on 1000 units/hr.  Goal of Therapy:  Heparin level 0.3-0.7 units/ml Monitor platelets by anticoagulation protocol: Yes   Plan:  Restart heparin infusion at 1000 units/hr at 0245am 7/24 Heparin level at 1100 7/24 Continue to monitor daily heparin level, H&H and platelets  Thank you for involving pharmacy in this patient's care.  [employer], Pharm.D.,  BCPS Clinical Pharmacist Pager: [phone] Clinical phone for 07/10/2018 is [phone].  **Pharmacist phone directory can now be found on [email or web address] (PW [email or web address]).  Listed under [facility].  07/10/2018 8:55 PM

## 2018-07-10 NOTE — Interval H&P Note (Signed)
History and Physical Interval Note:  07/10/2018 5:11 PM  Robert [name]  has presented today for surgery, with the diagnosis of CP concerning for Class II-III Angina (progressive).   The various methods of treatment have been discussed with the patient and family. After consideration of risks, benefits and other options for treatment, the patient has consented to  Procedure(s): LEFT HEART CATH AND CORS/GRAFTS ANGIOGRAPHY (N/A) with possible PERCUTANEOUS CORONARY INTERVENTION  as a surgical intervention .  The patient's history has been reviewed, patient examined, no change in status, stable for surgery.  I have reviewed the patient's chart and labs.  Questions were answered to the patient's satisfaction.    Cath Lab Visit (complete for each Cath Lab visit)  Clinical Evaluation Leading to the Procedure:   ACS: Yes.   uNSTABLE ANGINA Non-ACS:    Anginal Classification: CCS III  Anti-ischemic medical therapy: Maximal Therapy (2 or more classes of medications)  Non-Invasive Test Results: No non-invasive testing performed  Prior CABG: Previous CABG   Bryan Lemmaavid Ardie Dragoo

## 2018-07-10 NOTE — Progress Notes (Signed)
Patient stable throughout the night, just appeared to be agitated and/or anxious about having a procedure and being NPO.

## 2018-07-10 NOTE — Progress Notes (Signed)
Pt has been npo except few sips with meds this am, pt to cath lab in bed in stable condition, denies any cp or sob

## 2018-07-10 NOTE — Progress Notes (Signed)
ANTICOAGULATION CONSULT NOTE - Follow-Up Consult  Pharmacy Consult for heparin Indication: chest pain/ACS  No Known Allergies  Patient Measurements: Height: 5\' 9"  (175.3 cm) Weight: 173 lb (78.5 kg) IBW/kg (Calculated) : 70.7 Heparin Dosing Weight: 80.6 Kg  Vital Signs: Temp: 98 F (36.7 C) (07/23 1143) Temp Source: Oral (07/23 1143) BP: 128/83 (07/23 1143) Pulse Rate: 71 (07/23 1143)  Labs: Recent Labs    07/08/18 2110 07/09/18 0110 07/09/18 0709 07/09/18 1309 07/09/18 1911 07/10/18 0648  HGB 16.7  --   --   --   --  16.1  HCT 50.4  --   --   --   --  48.9  PLT 341  --   --   --   --  237  HEPARINUNFRC  --   --   --   --  0.38 0.39  CREATININE 1.29*  --  1.09  --   --  1.21  TROPONINI  --  <0.03 0.06* <0.03  --   --    Estimated Creatinine Clearance: 51.1 mL/min (by C-G formula based on SCr of 1.21 mg/dL).  Medical History: Past Medical History:  Diagnosis Date  . CAD (coronary artery disease)    a. CABG X4 2008. b. MI [place] stent (in [place])  . Fracture 1996   FOOT  . High cholesterol   . Hypertension   . Leg hematoma 01/2018  . Presence of arterial stent 2016   Right subclavian artery  . Stenosis of subclavian artery (HCC)    a. s/p R subclavian stent 2016.   Medications:  Medications Prior to Admission  Medication Sig Dispense Refill Last Dose  . amLODipine (NORVASC) 10 MG tablet Take 10 mg by mouth daily.   07/08/2018 at Unknown time  . aspirin 81 MG EC tablet Take 81 mg by mouth at bedtime. Swallow whole.    07/08/2018 at 1830  . atorvastatin (LIPITOR) 40 MG tablet Take 40 mg by mouth daily.   07/08/2018 at Unknown time  . hydrochlorothiazide (MICROZIDE) 12.5 MG capsule Take 12.5 mg by mouth daily.   07/08/2018 at Unknown time  . Magnesium 200 MG TABS Take 400 mg by mouth daily with breakfast.    07/08/2018 at Unknown time  . metoprolol succinate (TOPROL-XL) 50 MG 24 hr tablet Take 1 tablet (50 mg total) by mouth 2 (two) times daily. Take with or immediately  following a meal. (Patient taking differently: Take 50 mg by mouth daily. Take with or immediately following a meal.) 90 tablet 3 07/08/2018 at 0700  . nitroGLYCERIN (NITROSTAT) 0.4 MG SL tablet Place 0.4 mg under the tongue every 5 (five) minutes as needed for chest pain.   1 unk  . ramipril (ALTACE) 10 MG capsule Take 10 mg by mouth daily.   07/08/2018 at Unknown time    Assessment:  77yoM with PMH of CABG ~10 years ago presenting with chest pain and SOB. No anticoagulation reported PTA.  Pharmacy consulted to start heparin. Plan is for cath today, 7/23 at 1500  Confirmation heparin level this morning therapeutic at 0.39 H/h stable, drop in platelets; will trend  Goal of Therapy:  Heparin level 0.3-0.7 units/ml Monitor platelets by anticoagulation protocol: Yes   Plan:  Continue heparin infusion at 1000 units/hr Continue to monitor daily heparin level, H&H and platelets  Thank you for involving pharmacy in this patient's care.  Wendelyn Breslowylan Zelig Gacek, PharmD PGY1 Pharmacy Resident Phone: [phone] 07/10/2018 1:12 PM

## 2018-07-10 NOTE — Progress Notes (Signed)
Patient is scheduled for cath later today for evaluation of chest pain.  Hope for d/c after if no intervention needed.  Marlin CanaryJessica Tayari Yankee DO

## 2018-07-11 ENCOUNTER — Encounter (HOSPITAL_COMMUNITY): Payer: Self-pay | Admitting: Cardiology

## 2018-07-11 LAB — BASIC METABOLIC PANEL
Anion gap: 10 (ref 5–15)
BUN: 16 mg/dL (ref 8–23)
CO2: 22 mmol/L (ref 22–32)
Calcium: 9.1 mg/dL (ref 8.9–10.3)
Chloride: 108 mmol/L (ref 98–111)
Creatinine, Ser: 1.07 mg/dL (ref 0.61–1.24)
GFR calc non Af Amer: >60 mL/min (ref >60)
Glucose, Bld: 109 mg/dL — ABNORMAL HIGH (ref 70–99)
POTASSIUM: 3.9 mmol/L (ref 3.5–5.1)
SODIUM: 140 mmol/L (ref 135–145)

## 2018-07-11 LAB — GLUCOSE, CAPILLARY: GLUCOSE-CAPILLARY: 88 mg/dL (ref 70–99)

## 2018-07-11 LAB — CBC
HEMATOCRIT: 50.4 % (ref 39.0–52.0)
HEMOGLOBIN: 16.2 g/dL (ref 13.0–17.0)
MCH: 30.1 pg (ref 26.0–34.0)
MCHC: 32.1 g/dL (ref 30.0–36.0)
MCV: 93.5 fL (ref 78.0–100.0)
Platelets: 308 10*3/uL (ref 150–400)
RBC: 5.39 MIL/uL (ref 4.22–5.81)
RDW: 13.4 % (ref 11.5–15.5)
WBC: 10.4 10*3/uL (ref 4.0–10.5)

## 2018-07-11 LAB — HEPARIN LEVEL (UNFRACTIONATED)
Heparin Unfractionated: 0.1 IU/mL — ABNORMAL LOW (ref 0.30–0.70)
Heparin Unfractionated: 0.22 IU/mL — ABNORMAL LOW (ref 0.30–0.70)

## 2018-07-11 MED ORDER — SODIUM CHLORIDE 0.9% FLUSH
3.0000 mL | Freq: Two times a day (BID) | INTRAVENOUS | Status: DC
  Administered 2018-07-11: 3 mL via INTRAVENOUS

## 2018-07-11 MED ORDER — SODIUM CHLORIDE 0.9% FLUSH: 3.0000 mL | INTRAVENOUS | Status: DC | PRN

## 2018-07-11 MED ORDER — SODIUM CHLORIDE 0.9 % IV SOLN
250.0000 mL | INTRAVENOUS | Status: DC | PRN
  Administered 2018-07-12: 250 mL via INTRAVENOUS

## 2018-07-11 MED ORDER — ASPIRIN 81 MG PO CHEW
81.0000 mg | CHEWABLE_TABLET | ORAL | Status: AC
  Administered 2018-07-12: 81 mg via ORAL
  Filled 2018-07-11: qty 1

## 2018-07-11 MED ORDER — CLOPIDOGREL BISULFATE 75 MG PO TABS
75.0000 mg | ORAL_TABLET | Freq: Every day | ORAL | Status: DC
  Administered 2018-07-12 – 2018-07-13 (×2): 75 mg via ORAL
  Filled 2018-07-11 (×2): qty 1

## 2018-07-11 MED ORDER — CLOPIDOGREL BISULFATE 75 MG PO TABS
600.0000 mg | ORAL_TABLET | Freq: Once | ORAL | Status: AC
  Administered 2018-07-11: 600 mg via ORAL
  Filled 2018-07-11: qty 8

## 2018-07-11 MED ORDER — SODIUM CHLORIDE 0.9 % IV SOLN
INTRAVENOUS | Status: DC
  Administered 2018-07-12: 05:00:00 via INTRAVENOUS

## 2018-07-11 MED FILL — Lidocaine HCl Local Preservative Free (PF) Inj 1%: INTRAMUSCULAR | Qty: 30 | Status: AC

## 2018-07-11 NOTE — Progress Notes (Signed)
Attempted to get consent form signed for procedure and place second IV, but patient requested to wait until his visitors left.

## 2018-07-11 NOTE — Progress Notes (Signed)
Patient resting comfortably during shift report. Denies complaints.  

## 2018-07-11 NOTE — H&P (View-Only) (Signed)
 Progress Note  Patient Name: Robert [name] Date of Encounter: 07/11/2018  Primary Cardiologist: Michael Cooper, MD  Subjective   No chest pain this am.   Inpatient Medications    Scheduled Meds: . amLODipine  10 mg Oral Daily  . aspirin EC  81 mg Oral QHS  . atorvastatin  40 mg Oral q1800  . metoprolol succinate  50 mg Oral Daily  . ramipril  10 mg Oral Daily  . sodium chloride flush  3 mL Intravenous Q12H   Continuous Infusions: . sodium chloride    . heparin 1,000 Units/hr (07/11/18 0431)   PRN Meds: sodium chloride, acetaminophen, ALPRAZolam, nitroGLYCERIN, ondansetron (ZOFRAN) IV, sodium chloride flush   Vital Signs    Vitals:   07/11/18 0000 07/11/18 0028 07/11/18 0414 07/11/18 0417  BP: 109/74 127/60  (!) 141/93  Pulse: 77 94  (!) 58  Resp:  20  20  Temp:  98.3 F (36.8 C)  97.6 F (36.4 C)  TempSrc:  Oral  Oral  SpO2:  96%  97%  Weight:   158 lb 1.6 oz (71.7 kg)   Height:        Intake/Output Summary (Last 24 hours) at 07/11/2018 0922 Last data filed at 07/11/2018 0725 Gross per 24 hour  Intake 1136 ml  Output 950 ml  Net 186 ml   Filed Weights   07/09/18 0311 07/10/18 0458 07/11/18 0414  Weight: 177 lb 12.8 oz (80.6 kg) 173 lb (78.5 kg) 158 lb 1.6 oz (71.7 kg)    Telemetry    sinus - Personally Reviewed  ECG   No am ekg - Personally Reviewed  Physical Exam   General: Well developed, well nourished, NAD  HEENT: OP clear, mucus membranes moist  SKIN: warm, dry. No rashes. Neuro: No focal deficits  Musculoskeletal: Muscle strength 5/5 all ext  Psychiatric: Mood and affect normal  Neck: No JVD, no carotid bruits, no thyromegaly, no lymphadenopathy.  Lungs:Clear bilaterally, no wheezes, rhonci, crackles Cardiovascular: Regular rate and rhythm. No murmurs, gallops or rubs. Abdomen:Soft. Bowel sounds present. Non-tender.  Extremities: No lower extremity edema. Pulses are 2 + in the bilateral DP/PT.  Labs    Chemistry Recent Labs  Lab  07/09/18 0709 07/10/18 0648 07/11/18 0421  NA 141 140 140  K 3.6 3.9 3.9  CL 107 106 108  CO2 25 25 22  GLUCOSE 83 100* 109*  BUN 25* 17 16  CREATININE 1.09 1.21 1.07  CALCIUM 9.1 9.1 9.1  PROT  --  6.0*  --   ALBUMIN  --  3.4*  --   AST  --  27  --   ALT  --  31  --   ALKPHOS  --  54  --   BILITOT  --  0.7  --   GFRNONAA >60 56* >60  GFRAA >60 >60 >60  ANIONGAP 9 9 10     Hematology Recent Labs  Lab 07/08/18 2110 07/10/18 0648 07/11/18 0421  WBC 12.7* 8.5 10.4  RBC 5.45 5.20 5.39  HGB 16.7 16.1 16.2  HCT 50.4 48.9 50.4  MCV 92.5 94.0 93.5  MCH 30.6 31.0 30.1  MCHC 33.1 32.9 32.1  RDW 13.5 13.3 13.4  PLT 341 237 308    Cardiac Enzymes Recent Labs  Lab 07/09/18 0110 07/09/18 0709 07/09/18 1309  TROPONINI <0.03 0.06* <0.03    Recent Labs  Lab 07/05/18 1001 07/08/18 2121  TROPIPOC 0.00 0.00     BNP Recent Labs  Lab 07/05/18   0934  BNP 44.9     DDimer No results for input(s): DDIMER in the last 168 hours.    Radiology    No results found.  Cardiac Studies   N/a   Patient Profile     77 y.o. male with a hx of CAD s/p CABG 2008, right subclavian stent 2016 (on Plavix), HTN, HLD  who was admitted with unstable angina  Assessment & Plan    1. CAD with unstable angina: Cardiac cath 07/10/18 with severe three vessel CAD, patent LIMA graft and SVG to OM but occluded SVG to RCA and occluded SVG to presumed intermediate branch. Severe disease in mid RCA and PLA, distal left main and ramus ostium. Appropriately last night, Dr. Harding stopped after the diagnostic cath last night to consider repeat CABG vs multi-vessel PCI. I have reviewed the case with Dr. Harding as well as Dr. Cooper and Dr. Arida on the IC team. We feel that the best approach to revascularization would be with stenting of the left main into the intermediate branch and stenting of the  Mid RCA with balloon angioplasty of the PLA branch. Will plan PCI for Thursday 07/12/18 with Dr.  Harding.  -I will continue ASA, beta blocker and statin -Continue IV heparin drip.  -Load with Plavix today.    NPO at midnight. Cath orders placed.   I have reviewed the risks, indications, and alternatives to cardiac catheterization, possible angioplasty, and stenting with the patient. Risks include but are not limited to bleeding, infection, vascular injury, stroke, myocardial infection, arrhythmia, kidney injury, radiation-related injury in the case of prolonged fluoroscopy use, emergency cardiac surgery, and death. The patient understands the risks of serious complication is 1-2 in 1000 with diagnostic cardiac cath and 1-2% or less with angioplasty/stenting.  Signed, Yaron Grasse, MD  07/11/2018, 9:22 AM   For questions or updates, please contact [facility] Please consult [email or web address] for contact info under Cardiology/STEMI.      

## 2018-07-11 NOTE — Progress Notes (Signed)
ANTICOAGULATION CONSULT NOTE - Follow-Up Consult  Pharmacy Consult for heparin Indication: chest pain/ACS  No Known Allergies  Patient Measurements: Height: 5\' 9"  (175.3 cm) Weight: 158 lb 1.6 oz (71.7 kg)(scale b) IBW/kg (Calculated) : 70.7 Heparin Dosing Weight: 80.6 Kg  Vital Signs: Temp: 98 F (36.7 C) (07/24 2008) Temp Source: Oral (07/24 2008) BP: 116/69 (07/24 2008) Pulse Rate: 83 (07/24 2008)  Labs: Recent Labs    07/09/18 0110 07/09/18 0709 07/09/18 1309  07/10/18 0648 07/11/18 0421 07/11/18 1020 07/11/18 2143  HGB  --   --   --   --  16.1 16.2  --   --   HCT  --   --   --   --  48.9 50.4  --   --   PLT  --   --   --   --  237 308  --   --   HEPARINUNFRC  --   --   --    < > 0.39  --  0.10* 0.22*  CREATININE  --  1.09  --   --  1.21 1.07  --   --   TROPONINI <0.03 0.06* <0.03  --   --   --   --   --    < > = values in this interval not displayed.   Estimated Creatinine Clearance: 57.8 mL/min (by C-G formula based on SCr of 1.07 mg/dL).  Medical History: Past Medical History:  Diagnosis Date  . CAD (coronary artery disease)    a. CABG X4 2008. b. MI and stent (in [place]  . Fracture 1996   FOOT  . High cholesterol   . Hypertension   . Leg hematoma 01/2018  . Presence of arterial stent 2016   Right subclavian artery  . Stenosis of subclavian artery (HCC)    a. s/p R subclavian stent 2016.   Medications:  Medications Prior to Admission  Medication Sig Dispense Refill Last Dose  . amLODipine (NORVASC) 10 MG tablet Take 10 mg by mouth daily.   07/08/2018 at Unknown time  . aspirin 81 MG EC tablet Take 81 mg by mouth at bedtime. Swallow whole.    07/08/2018 at 1830  . atorvastatin (LIPITOR) 40 MG tablet Take 40 mg by mouth daily.   07/08/2018 at Unknown time  . hydrochlorothiazide (MICROZIDE) 12.5 MG capsule Take 12.5 mg by mouth daily.   07/08/2018 at Unknown time  . Magnesium 200 MG TABS Take 400 mg by mouth daily with breakfast.    07/08/2018 at Unknown time   . metoprolol succinate (TOPROL-XL) 50 MG 24 hr tablet Take 1 tablet (50 mg total) by mouth 2 (two) times daily. Take with or immediately following a meal. (Patient taking differently: Take 50 mg by mouth daily. Take with or immediately following a meal.) 90 tablet 3 07/08/2018 at 0700  . nitroGLYCERIN (NITROSTAT) 0.4 MG SL tablet Place 0.4 mg under the tongue every 5 (five) minutes as needed for chest pain.   1 unk  . ramipril (ALTACE) 10 MG capsule Take 10 mg by mouth daily.   07/08/2018 at Unknown time    Assessment:  77yo M with PMH of CABG ~10 years ago presenting with chest pain and SOB. No anticoagulation reported PTA.  Patient is now s/p cath and awaiting PCI 7/25.  Heparin resumed over night post sheath removal. No infusion issues or reported bleeding.   Goal of Therapy:  Heparin level 0.3-0.7 units/ml Monitor platelets by anticoagulation protocol: Yes   Plan:  Increase heparin infusion to 1300 units/hr  Continue to monitor daily heparin level, H&H and platelets  Thank you for involving pharmacy in this patient's care.  Talbert Cage, PharmD Clinical Pharmacist 07/11/2018 11:11 PM Please check AMION for all [facility] Pharmacy numbers

## 2018-07-11 NOTE — Plan of Care (Signed)
  Problem: Education: Goal: Knowledge of General Education information will improve Description Including pain rating scale, medication(s)/side effects and non-pharmacologic comfort measures Outcome: Adequate for Discharge   Problem: Health Behavior/Discharge Planning: Goal: Ability to manage health-related needs will improve Outcome: Adequate for Discharge   

## 2018-07-11 NOTE — Progress Notes (Signed)
Medication administration verified with NP.   Hold metoprolol, continue with other daily meds and loading dose of plavix

## 2018-07-11 NOTE — Progress Notes (Addendum)
Consent signed. Pt refuses second IV at this time, requests verification of need for second IV before placing the IV. Pt is scheduled for 1500 tomorrow, will allow rounding team to determine in AM.

## 2018-07-11 NOTE — Progress Notes (Addendum)
ANTICOAGULATION CONSULT NOTE - Follow-Up Consult  Pharmacy Consult for heparin Indication: chest pain/ACS  No Known Allergies  Patient Measurements: Height: 5\' 9"  (175.3 cm) Weight: 158 lb 1.6 oz (71.7 kg)(scale b) IBW/kg (Calculated) : 70.7 Heparin Dosing Weight: 80.6 Kg  Vital Signs: Temp: 98.2 F (36.8 C) (07/24 1231) Temp Source: Oral (07/24 1231) BP: 126/86 (07/24 1231) Pulse Rate: 86 (07/24 1231)  Labs: Recent Labs    07/08/18 2110 07/09/18 0110 07/09/18 0709 07/09/18 1309 07/09/18 1911 07/10/18 0648 07/11/18 0421 07/11/18 1020  HGB 16.7  --   --   --   --  16.1 16.2  --   HCT 50.4  --   --   --   --  48.9 50.4  --   PLT 341  --   --   --   --  237 308  --   HEPARINUNFRC  --   --   --   --  0.38 0.39  --  0.10*  CREATININE 1.29*  --  1.09  --   --  1.21 1.07  --   TROPONINI  --  <0.03 0.06* <0.03  --   --   --   --    Estimated Creatinine Clearance: 57.8 mL/min (by C-G formula based on SCr of 1.07 mg/dL).  Medical History: Past Medical History:  Diagnosis Date  . CAD (coronary artery disease)    a. CABG X4 2008. b. MI and stent (in [place])  . Fracture 1996   FOOT  . High cholesterol   . Hypertension   . Leg hematoma 01/2018  . Presence of arterial stent 2016   Right subclavian artery  . Stenosis of subclavian artery (HCC)    a. s/p R subclavian stent 2016.   Medications:  Medications Prior to Admission  Medication Sig Dispense Refill Last Dose  . amLODipine (NORVASC) 10 MG tablet Take 10 mg by mouth daily.   07/08/2018 at Unknown time  . aspirin 81 MG EC tablet Take 81 mg by mouth at bedtime. Swallow whole.    07/08/2018 at 1830  . atorvastatin (LIPITOR) 40 MG tablet Take 40 mg by mouth daily.   07/08/2018 at Unknown time  . hydrochlorothiazide (MICROZIDE) 12.5 MG capsule Take 12.5 mg by mouth daily.   07/08/2018 at Unknown time  . Magnesium 200 MG TABS Take 400 mg by mouth daily with breakfast.    07/08/2018 at Unknown time  . metoprolol succinate  (TOPROL-XL) 50 MG 24 hr tablet Take 1 tablet (50 mg total) by mouth 2 (two) times daily. Take with or immediately following a meal. (Patient taking differently: Take 50 mg by mouth daily. Take with or immediately following a meal.) 90 tablet 3 07/08/2018 at 0700  . nitroGLYCERIN (NITROSTAT) 0.4 MG SL tablet Place 0.4 mg under the tongue every 5 (five) minutes as needed for chest pain.   1 unk  . ramipril (ALTACE) 10 MG capsule Take 10 mg by mouth daily.   07/08/2018 at Unknown time    Assessment:  77yo M with PMH of CABG ~10 years ago presenting with chest pain and SOB. No anticoagulation reported PTA.  Patient is now s/p cath and awaiting PCI 7/25.  Heparin resumed over night post sheath removal. No infusion issues or reported bleeding. Will increase rate and follow-up labs.  Goal of Therapy:  Heparin level 0.3-0.7 units/ml Monitor platelets by anticoagulation protocol: Yes   Plan:  Increase heparin infusion to 1150 units/hr  Heparin level at tonight @ 2100  Continue to monitor daily heparin level, H&H and platelets  Thank you for involving pharmacy in this patient's care.  Ruben Imony Jeremiyah Cullens, PharmD Clinical Pharmacist 07/11/2018 1:01 PM Please check AMION for all [facility] Pharmacy numbers

## 2018-07-11 NOTE — Progress Notes (Signed)
TR band removed.

## 2018-07-11 NOTE — Progress Notes (Signed)
Progress Note  Patient Name: Robert GreavesRichard [name] Date of Encounter: 07/11/2018  Primary Cardiologist: Robert BollmanMichael Cooper, MD  Subjective   No chest pain this am.   Inpatient Medications    Scheduled Meds: . amLODipine  10 mg Oral Daily  . aspirin EC  81 mg Oral QHS  . atorvastatin  40 mg Oral q1800  . metoprolol succinate  50 mg Oral Daily  . ramipril  10 mg Oral Daily  . sodium chloride flush  3 mL Intravenous Q12H   Continuous Infusions: . sodium chloride    . heparin 1,000 Units/hr (07/11/18 0431)   PRN Meds: sodium chloride, acetaminophen, ALPRAZolam, nitroGLYCERIN, ondansetron (ZOFRAN) IV, sodium chloride flush   Vital Signs    Vitals:   07/11/18 0000 07/11/18 0028 07/11/18 0414 07/11/18 0417  BP: 109/74 127/60  (!) 141/93  Pulse: 77 94  (!) 58  Resp:  20  20  Temp:  98.3 F (36.8 C)  97.6 F (36.4 C)  TempSrc:  Oral  Oral  SpO2:  96%  97%  Weight:   158 lb 1.6 oz (71.7 kg)   Height:        Intake/Output Summary (Last 24 hours) at 07/11/2018 0922 Last data filed at 07/11/2018 0725 Gross per 24 hour  Intake 1136 ml  Output 950 ml  Net 186 ml   Filed Weights   07/09/18 0311 07/10/18 0458 07/11/18 0414  Weight: 177 lb 12.8 oz (80.6 kg) 173 lb (78.5 kg) 158 lb 1.6 oz (71.7 kg)    Telemetry    sinus - Personally Reviewed  ECG   No am ekg - Personally Reviewed  Physical Exam   General: Well developed, well nourished, NAD  HEENT: OP clear, mucus membranes moist  SKIN: warm, dry. No rashes. Neuro: No focal deficits  Musculoskeletal: Muscle strength 5/5 all ext  Psychiatric: Mood and affect normal  Neck: No JVD, no carotid bruits, no thyromegaly, no lymphadenopathy.  Lungs:Clear bilaterally, no wheezes, rhonci, crackles Cardiovascular: Regular rate and rhythm. No murmurs, gallops or rubs. Abdomen:Soft. Bowel sounds present. Non-tender.  Extremities: No lower extremity edema. Pulses are 2 + in the bilateral DP/PT.  Labs    Chemistry Recent Labs  Lab  07/09/18 0709 07/10/18 0648 07/11/18 0421  NA 141 140 140  K 3.6 3.9 3.9  CL 107 106 108  CO2 25 25 22   GLUCOSE 83 100* 109*  BUN 25* 17 16  CREATININE 1.09 1.21 1.07  CALCIUM 9.1 9.1 9.1  PROT  --  6.0*  --   ALBUMIN  --  3.4*  --   AST  --  27  --   ALT  --  31  --   ALKPHOS  --  54  --   BILITOT  --  0.7  --   GFRNONAA >60 56* >60  GFRAA >60 >60 >60  ANIONGAP 9 9 10      Hematology Recent Labs  Lab 07/08/18 2110 07/10/18 0648 07/11/18 0421  WBC 12.7* 8.5 10.4  RBC 5.45 5.20 5.39  HGB 16.7 16.1 16.2  HCT 50.4 48.9 50.4  MCV 92.5 94.0 93.5  MCH 30.6 31.0 30.1  MCHC 33.1 32.9 32.1  RDW 13.5 13.3 13.4  PLT 341 237 308    Cardiac Enzymes Recent Labs  Lab 07/09/18 0110 07/09/18 0709 07/09/18 1309  TROPONINI <0.03 0.06* <0.03    Recent Labs  Lab 07/05/18 1001 07/08/18 2121  TROPIPOC 0.00 0.00     BNP Recent Labs  Lab 07/05/18  0934  BNP 44.9     DDimer No results for input(s): DDIMER in the last 168 hours.    Radiology    No results found.  Cardiac Studies   N/a   Patient Profile     77 y.o. male with a hx of CAD s/p CABG 2008, right subclavian stent 2016 (on Plavix), HTN, HLD  who was admitted with unstable angina  Assessment & Plan    1. CAD with unstable angina: Cardiac cath 07/10/18 with severe three vessel CAD, patent LIMA graft and SVG to OM but occluded SVG to RCA and occluded SVG to presumed intermediate branch. Severe disease in mid RCA and PLA, distal left main and ramus ostium. Appropriately last night, Dr. Herbie [name] stopped after the diagnostic cath last night to consider repeat CABG vs multi-vessel PCI. I have reviewed the case with Dr. Herbie [name] as well as Dr. Excell [name] and Dr. Kirke [name] on the IC team. We feel that the best approach to revascularization would be with stenting of the left main into the intermediate branch and stenting of the  Mid RCA with balloon angioplasty of the PLA branch. Will plan PCI for Thursday 07/12/18 with Dr.  Herbie [name].  -I will continue ASA, beta blocker and statin -Continue IV heparin drip.  -Load with Plavix today.    NPO at midnight. Cath orders placed.   I have reviewed the risks, indications, and alternatives to cardiac catheterization, possible angioplasty, and stenting with the patient. Risks include but are not limited to bleeding, infection, vascular injury, stroke, myocardial infection, arrhythmia, kidney injury, radiation-related injury in the case of prolonged fluoroscopy use, emergency cardiac surgery, and death. The patient understands the risks of serious complication is 1-2 in 1000 with diagnostic cardiac cath and 1-2% or less with angioplasty/stenting.  Signed, Verne Carrow, MD  07/11/2018, 9:22 AM   For questions or updates, please contact [facility] Please consult [email or web address] for contact info under Cardiology/STEMI.

## 2018-07-11 NOTE — Progress Notes (Signed)
Progress Note    [place]  VWU:[id number] DOB: 09/02/1941  DOA: 07/08/2018 PCP: Georgann Housekeeper, MD    Brief Narrative:    Medical records reviewed and are as summarized below:  Robert [name] is an 77 y.o. male with medical history significant for CAD status post CABG more than 10 years ago, hypertension, and mild renal insufficiency, now presenting to the emergency department for evaluation of chest pain with mild shortness of breath.  Patient was experiencing episodes of chest pain several days ago and noted his heart rate to be as low as the 40s on home monitor. He had a stress test 1 month ago that was considered a low risk study.  Cath done showed extensive disease, plan for stents in the AM.      Assessment/Plan:   Principal Problem:   Chest pain Active Problems:   CAD (coronary artery disease)   Essential (primary) hypertension   Palpitations   Unstable angina (HCC)  Chest pain: CAD with unstable angina -cath: 07/10/18 with severe three vessel CAD, patent LIMA graft and SVG to OM but occluded SVG to RCA and occluded SVG to presumed intermediate branch. Severe disease in mid RCA and PLA, distal left main and ramus ostium. -consideration given to repeat CABG vs stents-- plan is for stents in the AM -ASA/BB/statin -IV Heparin -loaded with plavix by caardiology  HTN -norvasc/toprolol/ACE  Renal insufficiency (Cr1.2) -improved to Cr of 1  Family Communication/Anticipated D/C date and plan/Code Status   DVT prophylaxis: heparin Code Status: Full Code.  Family Communication:  Disposition Plan: home Friday after cath??   Medical Consultants:    cards     Subjective:   No CP this AM, would like to "wash up"  Objective:    Vitals:   07/11/18 0417 07/11/18 1030 07/11/18 1035 07/11/18 1231  BP: (!) 141/93 118/62  126/86  Pulse: (!) 58 (!) 45 (!) 35 86  Resp: 20   20  Temp: 97.6 F (36.4 C)   98.2 F (36.8 C)  TempSrc: Oral   Oral  SpO2: 97%   94%    Weight:      Height:        Intake/Output Summary (Last 24 hours) at 07/11/2018 1317 Last data filed at 07/11/2018 1015 Gross per 24 hour  Intake 1224.17 ml  Output 1175 ml  Net 49.17 ml   Filed Weights   07/09/18 0311 07/10/18 0458 07/11/18 0414  Weight: 80.6 kg (177 lb 12.8 oz) 78.5 kg (173 lb) 71.7 kg (158 lb 1.6 oz)    Exam: In bed, NAD No increased work of breathing No rashes of lesion A+Ox3 Not as guarded today  Data Reviewed:   I have personally reviewed following labs and imaging studies:  Labs: Labs show the following:   Basic Metabolic Panel: Recent Labs  Lab 07/05/18 0934 07/08/18 2110 07/09/18 0709 07/10/18 0648 07/11/18 0421  NA 138 137 141 140 140  K 4.0 4.4 3.6 3.9 3.9  CL 104 105 107 106 108  CO2 26 22 25 25 22   GLUCOSE 135* 139* 83 100* 109*  BUN 23 27* 25* 17 16  CREATININE 1.25* 1.29* 1.09 1.21 1.07  CALCIUM 9.4 9.3 9.1 9.1 9.1  MG 2.5*  --  2.4  --   --    GFR Estimated Creatinine Clearance: 57.8 mL/min (by C-G formula based on SCr of 1.07 mg/dL). Liver Function Tests: Recent Labs  Lab 07/10/18 0648  AST 27  ALT 31  ALKPHOS  54  BILITOT 0.7  PROT 6.0*  ALBUMIN 3.4*   No results for input(s): LIPASE, AMYLASE in the last 168 hours. No results for input(s): AMMONIA in the last 168 hours. Coagulation profile No results for input(s): INR, PROTIME in the last 168 hours.  CBC: Recent Labs  Lab 07/05/18 0934 07/08/18 2110 07/10/18 0648 07/11/18 0421  WBC 11.7* 12.7* 8.5 10.4  HGB 16.3 16.7 16.1 16.2  HCT 50.4 50.4 48.9 50.4  MCV 94.4 92.5 94.0 93.5  PLT 346 341 237 308   Cardiac Enzymes: Recent Labs  Lab 07/09/18 0110 07/09/18 0709 07/09/18 1309  TROPONINI <0.03 0.06* <0.03   BNP (last 3 results) No results for input(s): PROBNP in the last 8760 hours. CBG: Recent Labs  Lab 07/10/18 0759 07/10/18 1216 07/10/18 1635 07/10/18 2102  GLUCAP 101* 97 103* 120*   D-Dimer: No results for input(s): DDIMER in the last  72 hours. Hgb A1c: No results for input(s): HGBA1C in the last 72 hours. Lipid Profile: Recent Labs    07/10/18 0648  CHOL 110  HDL 33*  LDLCALC 57  TRIG 409100  CHOLHDL 3.3   Thyroid function studies: No results for input(s): TSH, T4TOTAL, T3FREE, THYROIDAB in the last 72 hours.  Invalid input(s): FREET3 Anemia work up: No results for input(s): VITAMINB12, FOLATE, FERRITIN, TIBC, IRON, RETICCTPCT in the last 72 hours. Sepsis Labs: Recent Labs  Lab 07/05/18 0934 07/08/18 2110 07/10/18 0648 07/11/18 0421  WBC 11.7* 12.7* 8.5 10.4    Microbiology No results found for this or any previous visit (from the past 240 hour(s)).  Procedures and diagnostic studies:  No results found.  Medications:   . amLODipine  10 mg Oral Daily  . aspirin EC  81 mg Oral QHS  . atorvastatin  40 mg Oral q1800  . [START ON 07/12/2018] clopidogrel  75 mg Oral Daily  . metoprolol succinate  50 mg Oral Daily  . ramipril  10 mg Oral Daily  . sodium chloride flush  3 mL Intravenous Q12H   Continuous Infusions: . sodium chloride    . heparin 1,000 Units/hr (07/11/18 1243)     LOS: 1 day   Joseph ArtJessica U Laira Penninger  [facility]   *Please refer to [email or web address], password [email or web address] to get updated schedule on who will round on this patient, as hospitalists switch teams weekly. If 7PM-7AM, please contact night-coverage at [email or web address], password [email or web address] for any overnight needs.  07/11/2018, 1:17 PM

## 2018-07-12 ENCOUNTER — Encounter (HOSPITAL_COMMUNITY): Payer: Self-pay | Admitting: Cardiology

## 2018-07-12 ENCOUNTER — Encounter (HOSPITAL_COMMUNITY)
Admission: EM | Disposition: A | Discharge status: Home / Self Care | Payer: Self-pay | Source: Home / Self Care | Attending: Internal Medicine

## 2018-07-12 DIAGNOSIS — I200 Unstable angina: Secondary | ICD-10-CM

## 2018-07-12 HISTORY — PX: CORONARY STENT INTERVENTION: CATH118234

## 2018-07-12 LAB — CBC
HCT: 47.1 % (ref 39.0–52.0)
HEMOGLOBIN: 15.4 g/dL (ref 13.0–17.0)
MCH: 30.6 pg (ref 26.0–34.0)
MCHC: 32.7 g/dL (ref 30.0–36.0)
MCV: 93.5 fL (ref 78.0–100.0)
Platelets: 286 10*3/uL (ref 150–400)
RBC: 5.04 MIL/uL (ref 4.22–5.81)
RDW: 13.2 % (ref 11.5–15.5)
WBC: 9.4 10*3/uL (ref 4.0–10.5)

## 2018-07-12 LAB — BASIC METABOLIC PANEL
ANION GAP: 9 (ref 5–15)
BUN: 11 mg/dL (ref 8–23)
CHLORIDE: 111 mmol/L (ref 98–111)
CO2: 21 mmol/L — AB (ref 22–32)
Calcium: 8.6 mg/dL — ABNORMAL LOW (ref 8.9–10.3)
Creatinine, Ser: 1.13 mg/dL (ref 0.61–1.24)
GFR calc non Af Amer: >60 mL/min (ref >60)
GLUCOSE: 111 mg/dL — AB (ref 70–99)
Potassium: 3.4 mmol/L — ABNORMAL LOW (ref 3.5–5.1)
Sodium: 141 mmol/L (ref 135–145)

## 2018-07-12 LAB — POCT ACTIVATED CLOTTING TIME
ACTIVATED CLOTTING TIME: 312 s
ACTIVATED CLOTTING TIME: 323 s
Activated Clotting Time: 175 seconds
Activated Clotting Time: 169 seconds

## 2018-07-12 LAB — GLUCOSE, CAPILLARY: GLUCOSE-CAPILLARY: 94 mg/dL (ref 70–99)

## 2018-07-12 LAB — HEPARIN LEVEL (UNFRACTIONATED): Heparin Unfractionated: 1.6 IU/mL — ABNORMAL HIGH (ref 0.30–0.70)

## 2018-07-12 SURGERY — CORONARY STENT INTERVENTION
Anesthesia: LOCAL

## 2018-07-12 MED ORDER — HEPARIN (PORCINE) IN NACL 1000-0.9 UT/500ML-% IV SOLN
INTRAVENOUS | Status: DC | PRN
  Administered 2018-07-12 (×2): 500 mL

## 2018-07-12 MED ORDER — SODIUM CHLORIDE 0.9 % IV SOLN: 250.0000 mL | INTRAVENOUS | Status: DC | PRN

## 2018-07-12 MED ORDER — HEPARIN (PORCINE) IN NACL 1000-0.9 UT/500ML-% IV SOLN
INTRAVENOUS | Status: AC
  Filled 2018-07-12: qty 1000

## 2018-07-12 MED ORDER — LIDOCAINE HCL (PF) 1 % IJ SOLN
INTRAMUSCULAR | Status: DC | PRN
  Administered 2018-07-12: 20 mL

## 2018-07-12 MED ORDER — SODIUM CHLORIDE 0.9% FLUSH: 3.0000 mL | INTRAVENOUS | Status: DC | PRN

## 2018-07-12 MED ORDER — MIDAZOLAM HCL 2 MG/2ML IJ SOLN
INTRAMUSCULAR | Status: DC | PRN
  Administered 2018-07-12 (×2): 1 mg via INTRAVENOUS

## 2018-07-12 MED ORDER — NITROGLYCERIN 1 MG/10 ML FOR IR/CATH LAB
INTRA_ARTERIAL | Status: DC | PRN
  Administered 2018-07-12: 200 ug via INTRACORONARY

## 2018-07-12 MED ORDER — POTASSIUM CHLORIDE CRYS ER 20 MEQ PO TBCR
40.0000 meq | EXTENDED_RELEASE_TABLET | Freq: Once | ORAL | Status: AC
Start: 2018-07-12 — End: 2018-07-12
  Administered 2018-07-12: 40 meq via ORAL
  Filled 2018-07-12: qty 2

## 2018-07-12 MED ORDER — FENTANYL CITRATE (PF) 100 MCG/2ML IJ SOLN
INTRAMUSCULAR | Status: AC
  Filled 2018-07-12: qty 2

## 2018-07-12 MED ORDER — SODIUM CHLORIDE 0.9% FLUSH
3.0000 mL | Freq: Two times a day (BID) | INTRAVENOUS | Status: DC
  Administered 2018-07-12 – 2018-07-13 (×2): 3 mL via INTRAVENOUS

## 2018-07-12 MED ORDER — HYDRALAZINE HCL 20 MG/ML IJ SOLN: 5.0000 mg | INTRAMUSCULAR | Status: AC | PRN

## 2018-07-12 MED ORDER — FENTANYL CITRATE (PF) 100 MCG/2ML IJ SOLN
INTRAMUSCULAR | Status: DC | PRN
  Administered 2018-07-12 (×2): 25 ug via INTRAVENOUS

## 2018-07-12 MED ORDER — IOPAMIDOL (ISOVUE-370) INJECTION 76%
INTRAVENOUS | Status: AC
  Filled 2018-07-12: qty 125

## 2018-07-12 MED ORDER — NITROGLYCERIN 1 MG/10 ML FOR IR/CATH LAB
INTRA_ARTERIAL | Status: AC
  Filled 2018-07-12: qty 10

## 2018-07-12 MED ORDER — HEPARIN SODIUM (PORCINE) 1000 UNIT/ML IJ SOLN
INTRAMUSCULAR | Status: AC
  Filled 2018-07-12: qty 1

## 2018-07-12 MED ORDER — LIDOCAINE HCL (PF) 1 % IJ SOLN
INTRAMUSCULAR | Status: AC
  Filled 2018-07-12: qty 30

## 2018-07-12 MED ORDER — ANGIOPLASTY BOOK
Freq: Once | Status: AC
  Administered 2018-07-12: 1
  Filled 2018-07-12: qty 1

## 2018-07-12 MED ORDER — SODIUM CHLORIDE 0.9 % IV SOLN: INTRAVENOUS | Status: AC

## 2018-07-12 MED ORDER — HEPARIN SODIUM (PORCINE) 1000 UNIT/ML IJ SOLN
INTRAMUSCULAR | Status: DC | PRN
  Administered 2018-07-12: 6500 [IU] via INTRAVENOUS
  Administered 2018-07-12: 5000 [IU] via INTRAVENOUS

## 2018-07-12 MED ORDER — LABETALOL HCL 5 MG/ML IV SOLN: 10.0000 mg | INTRAVENOUS | Status: AC | PRN

## 2018-07-12 MED ORDER — MIDAZOLAM HCL 2 MG/2ML IJ SOLN
INTRAMUSCULAR | Status: AC
  Filled 2018-07-12: qty 2

## 2018-07-12 SURGICAL SUPPLY — 29 items
BALLN EUPHORA RX 1.5X10 (BALLOONS) ×2
BALLN SAPPHIRE 2.0X12 (BALLOONS) ×2
BALLN SAPPHIRE ~~LOC~~ 3.0X10 (BALLOONS) ×2 IMPLANT
BALLN WOLVERINE 2.00X10 (BALLOONS) ×2
BALLN WOLVERINE 2.75X15 (BALLOONS) ×2
BALLN ~~LOC~~ EMERGE MR 2.75X12 (BALLOONS) ×2
BALLOON EUPHORA RX 1.5X10 (BALLOONS) ×1 IMPLANT
BALLOON SAPPHIRE 2.0X12 (BALLOONS) ×1 IMPLANT
BALLOON WOLVERINE 2.00X10 (BALLOONS) ×1 IMPLANT
BALLOON WOLVERINE 2.75X15 (BALLOONS) ×1 IMPLANT
BALLOON ~~LOC~~ EMERGE MR 2.75X12 (BALLOONS) ×1 IMPLANT
CATH LAUNCHER 6FR 3DRC (CATHETERS) ×1 IMPLANT
CATH VISTA GUIDE 6FR XBLAD3.5 (CATHETERS) ×2 IMPLANT
CATHETER LAUNCHER 6FR 3DRC (CATHETERS) ×2
KIT ENCORE 26 ADVANTAGE (KITS) ×2 IMPLANT
KIT HEART LEFT (KITS) ×2 IMPLANT
KIT HEMO VALVE WATCHDOG (MISCELLANEOUS) ×2 IMPLANT
NEEDLE 18GX3-1/2 9CM (NEEDLE) ×2 IMPLANT
NEEDLE PERC 18GX4CM (NEEDLE) IMPLANT
PACK CARDIAC CATHETERIZATION (CUSTOM PROCEDURE TRAY) ×2 IMPLANT
SHEATH PINNACLE 6F 10CM (SHEATH) ×2 IMPLANT
STENT SIERRA 2.50 X 15 MM (Permanent Stent) ×2 IMPLANT
TRANSDUCER W/STOPCOCK (MISCELLANEOUS) ×2 IMPLANT
TUBING CIL FLEX 10 FLL-RA (TUBING) ×2 IMPLANT
WIRE ASAHI PROWATER 180CM (WIRE) ×2 IMPLANT
WIRE EMERALD 3MM-J .035X150CM (WIRE) ×2 IMPLANT
WIRE HI TORQ VERSACORE-J 145CM (WIRE) ×2 IMPLANT
WIRE HI TORQ WHISPER MS 190CM (WIRE) ×2 IMPLANT
WIRE RUNTHROUGH .014X180CM (WIRE) ×2 IMPLANT

## 2018-07-12 NOTE — Progress Notes (Signed)
Progress Note    Robert [name]  WUJ:[id number] DOB: 23-May-1941  DOA: 07/08/2018 PCP: Georgann Housekeeper, MD    Brief Narrative:    Medical records reviewed and are as summarized below:  Robert [name] is an 76 y.o. male with medical history significant for CAD status post CABG more than 10 years ago, hypertension, and mild renal insufficiency, now presenting to the emergency department for evaluation of chest pain with mild shortness of breath.  Patient was experiencing episodes of chest pain several days ago and noted his heart rate to be as low as the 40s on home monitor. He had a stress test 1 month ago that was considered a low risk study.  Cath done showed extensive disease, plan for stents in the AM.    07/12/2018: Patient underwent angioplasty today.  Chest pain has resolved.  No new complaints.  Assessment/Plan:   Principal Problem:   Chest pain Active Problems:   CAD (coronary artery disease)   Essential (primary) hypertension   Palpitations   Unstable angina (HCC)  Chest pain: CAD with unstable angina -cath: 07/10/18 with severe three vessel CAD, patent LIMA graft and SVG to OM but occluded SVG to RCA and occluded SVG to presumed intermediate branch. Severe disease in mid RCA and PLA, distal left main and ramus ostium. -consideration given to repeat CABG vs stents-- plan is for stents in the AM -ASA/BB/statin -IV Heparin -loaded with plavix by caardiology -Patient is status angioplasty today.    HTN -norvasc/toprolol/ACE -Continue to optimize.  Renal insufficiency (Cr1.2) -improved to Cr of 1 -Continue to monitor.  Family Communication/Anticipated D/C date and plan/Code Status   DVT prophylaxis: heparin Code Status: Full Code.  Family Communication:  Disposition Plan: home Friday after cath??   Medical Consultants:    cards     Subjective:   No CP this AM, would like to "wash up"  Objective:    Vitals:   07/12/18 1131 07/12/18 1207 07/12/18 1446  07/12/18 1918  BP: 129/77 112/74 137/66 138/74  Pulse: 80 76 76 91  Resp: (!) 9 15 12    Temp:  97.8 F (36.6 C) 97.8 F (36.6 C) 97.9 F (36.6 C)  TempSrc:  Oral Oral Oral  SpO2: 95% 95% 97% 98%  Weight:      Height:        Intake/Output Summary (Last 24 hours) at 07/12/2018 2039 Last data filed at 07/12/2018 2000 Gross per 24 hour  Intake 809.52 ml  Output 1350 ml  Net -540.48 ml   Filed Weights   07/10/18 0458 07/11/18 0414 07/12/18 0558  Weight: 78.5 kg (173 lb) 71.7 kg (158 lb 1.6 oz) 78.8 kg (173 lb 11.2 oz)    Exam: In bed, NAD No increased work of breathing No rashes of lesion A+Ox3 Not as guarded today  Data Reviewed:   I have personally reviewed following labs and imaging studies:  Labs: Labs show the following:   Basic Metabolic Panel: Recent Labs  Lab 07/08/18 2110 07/09/18 0709 07/10/18 0648 07/11/18 0421 07/12/18 0542  NA 137 141 140 140 141  K 4.4 3.6 3.9 3.9 3.4*  CL 105 107 106 108 111  CO2 22 25 25 22  21*  GLUCOSE 139* 83 100* 109* 111*  BUN 27* 25* 17 16 11   CREATININE 1.29* 1.09 1.21 1.07 1.13  CALCIUM 9.3 9.1 9.1 9.1 8.6*  MG  --  2.4  --   --   --    GFR Estimated Creatinine Clearance: 54.7  mL/min (by C-G formula based on SCr of 1.13 mg/dL). Liver Function Tests: Recent Labs  Lab 07/10/18 0648  AST 27  ALT 31  ALKPHOS 54  BILITOT 0.7  PROT 6.0*  ALBUMIN 3.4*   No results for input(s): LIPASE, AMYLASE in the last 168 hours. No results for input(s): AMMONIA in the last 168 hours. Coagulation profile No results for input(s): INR, PROTIME in the last 168 hours.  CBC: Recent Labs  Lab 07/08/18 2110 07/10/18 0648 07/11/18 0421 07/12/18 0542  WBC 12.7* 8.5 10.4 9.4  HGB 16.7 16.1 16.2 15.4  HCT 50.4 48.9 50.4 47.1  MCV 92.5 94.0 93.5 93.5  PLT 341 237 308 286   Cardiac Enzymes: Recent Labs  Lab 07/09/18 0110 07/09/18 0709 07/09/18 1309  TROPONINI <0.03 0.06* <0.03   BNP (last 3 results) No results for  input(s): PROBNP in the last 8760 hours. CBG: Recent Labs  Lab 07/10/18 1216 07/10/18 1635 07/10/18 2102 07/11/18 2200 07/12/18 0758  GLUCAP 97 103* 120* 88 94   D-Dimer: No results for input(s): DDIMER in the last 72 hours. Hgb A1c: No results for input(s): HGBA1C in the last 72 hours. Lipid Profile: Recent Labs    07/10/18 0648  CHOL 110  HDL 33*  LDLCALC 57  TRIG 409100  CHOLHDL 3.3   Thyroid function studies: No results for input(s): TSH, T4TOTAL, T3FREE, THYROIDAB in the last 72 hours.  Invalid input(s): FREET3 Anemia work up: No results for input(s): VITAMINB12, FOLATE, FERRITIN, TIBC, IRON, RETICCTPCT in the last 72 hours. Sepsis Labs: Recent Labs  Lab 07/08/18 2110 07/10/18 0648 07/11/18 0421 07/12/18 0542  WBC 12.7* 8.5 10.4 9.4    Microbiology No results found for this or any previous visit (from the past 240 hour(s)).  Procedures and diagnostic studies:  No results found.  Medications:   . amLODipine  10 mg Oral Daily  . aspirin EC  81 mg Oral QHS  . atorvastatin  40 mg Oral q1800  . clopidogrel  75 mg Oral Daily  . metoprolol succinate  50 mg Oral Daily  . ramipril  10 mg Oral Daily  . sodium chloride flush  3 mL Intravenous Q12H  . sodium chloride flush  3 mL Intravenous Q12H   Continuous Infusions: . sodium chloride    . sodium chloride 75 mL/hr at 07/12/18 2000  . sodium chloride       LOS: 2 days   Barnetta ChapelSylvester I Taelynn Mcelhannon  [facility]   *Please refer to [email or web address], password [email or web address] to get updated schedule on who will round on this patient, as hospitalists switch teams weekly. If 7PM-7AM, please contact night-coverage at [email or web address], password [email or web address] for any overnight needs.  07/12/2018, 8:39 PM

## 2018-07-12 NOTE — Interval H&P Note (Signed)
History and Physical Interval Note:  07/12/2018 9:26 AM  Robert [name]  has presented today for surgery, with the diagnosis of cad.   The various methods of treatment have been discussed with the patient and family. After consideration of risks, benefits and other options for treatment, the patient has consented to  Procedure(s): CORONARY STENT INTERVENTION (N/A) as a surgical intervention .  The patient's history has been reviewed, patient examined, no change in status, stable for surgery.  I have reviewed the patient's chart and labs.  Questions were answered to the patient's satisfaction.     Bryan Lemmaavid Harding

## 2018-07-12 NOTE — Progress Notes (Signed)
Site area: right groin  Site Prior to Removal:  Level 0  Pressure Applied For 20 MINUTES    Minutes Beginning at 1423  Manual:   Yes.    Patient Status During Pull:  WNL  Post Pull Groin Site:  Level 0  Post Pull Instructions Given:  Yes.    Post Pull Pulses Present:  Yes.    Dressing Applied:  Yes.    Comments:  T olerated procedure well

## 2018-07-13 DIAGNOSIS — Z955 Presence of coronary angioplasty implant and graft: Secondary | ICD-10-CM

## 2018-07-13 LAB — CBC WITH DIFFERENTIAL/PLATELET
Abs Immature Granulocytes: 0 10*3/uL (ref 0.0–0.1)
Basophils Absolute: 0.1 10*3/uL (ref 0.0–0.1)
Basophils Relative: 1 %
Eosinophils Absolute: 0.3 10*3/uL (ref 0.0–0.7)
Eosinophils Relative: 3 %
HCT: 46.7 % (ref 39.0–52.0)
Hemoglobin: 15.5 g/dL (ref 13.0–17.0)
Immature Granulocytes: 0 %
Lymphocytes Relative: 23 %
Lymphs Abs: 2.1 10*3/uL (ref 0.7–4.0)
MCH: 30.9 pg (ref 26.0–34.0)
MCHC: 33.2 g/dL (ref 30.0–36.0)
MCV: 93 fL (ref 78.0–100.0)
Monocytes Absolute: 0.7 10*3/uL (ref 0.1–1.0)
Monocytes Relative: 8 %
Neutro Abs: 6 10*3/uL (ref 1.7–7.7)
Neutrophils Relative %: 65 %
Platelets: 257 10*3/uL (ref 150–400)
RBC: 5.02 MIL/uL (ref 4.22–5.81)
RDW: 13.4 % (ref 11.5–15.5)
WBC: 9.2 10*3/uL (ref 4.0–10.5)

## 2018-07-13 LAB — RENAL FUNCTION PANEL
Albumin: 3.1 g/dL — ABNORMAL LOW (ref 3.5–5.0)
Anion gap: 8 (ref 5–15)
BUN: 14 mg/dL (ref 8–23)
CO2: 23 mmol/L (ref 22–32)
Calcium: 9 mg/dL (ref 8.9–10.3)
Chloride: 111 mmol/L (ref 98–111)
Creatinine, Ser: 1.09 mg/dL (ref 0.61–1.24)
GFR calc Af Amer: >60 mL/min (ref >60)
GFR calc non Af Amer: >60 mL/min (ref >60)
Glucose, Bld: 109 mg/dL — ABNORMAL HIGH (ref 70–99)
Phosphorus: 3 mg/dL (ref 2.5–4.6)
Potassium: 4.5 mmol/L (ref 3.5–5.1)
Sodium: 142 mmol/L (ref 135–145)

## 2018-07-13 LAB — HEPARIN LEVEL (UNFRACTIONATED): Heparin Unfractionated: <0.1 IU/mL — ABNORMAL LOW (ref 0.30–0.70)

## 2018-07-13 MED ORDER — RAMIPRIL 2.5 MG PO CAPS: 2.5000 mg | ORAL_CAPSULE | Freq: Every day | ORAL | 0 refills | Status: DC

## 2018-07-13 MED ORDER — CLOPIDOGREL BISULFATE 75 MG PO TABS: 75.0000 mg | ORAL_TABLET | Freq: Every day | ORAL | 0 refills | Status: DC

## 2018-07-13 MED ORDER — AMLODIPINE BESYLATE 2.5 MG PO TABS: 2.5000 mg | ORAL_TABLET | Freq: Every day | ORAL | 0 refills | Status: DC

## 2018-07-13 NOTE — Discharge Summary (Signed)
Physician Discharge Summary  Patient ID: Robert [name] MRN: [id number] DOB/AGE: 06-08-1941 77 y.o.  Admit date: 07/08/2018 Discharge date: 07/13/2018  Admission Diagnoses:  Discharge Diagnoses:  Principal Problem:   Chest pain Active Problems:   CAD (coronary artery disease) status post PCI to the distal RCA   Essential (primary) hypertension   Palpitations   Unstable angina [facility])   Discharged Condition: stable  Hospital Course: Patient is a 77 year old Caucasian male with past medical history significant forCAD status post CABG more than 10 years ago, hypertension, and mild renal insufficiency.  Patient was admitted with chest pain and mild shortness of breath.  Apparently, patient experienced episodes of chest pain several days prior to admission, with bradycardia to the 40s.  Patient was said to have undergone cardiac stress test a month prior to presentation and the test was considered a low risk study.  On presentation, cardiology team was consulted.  Patient underwent cardiac catheterization that revealed extensive disease.  Patient eventually underwent angioplasty (PCI to the distal RCA.  Left main angioplasty was not possible and, therefore, aborted).  Patient is chest pain-free.  Patient is eager to be discharged back home.   Consults: cardiology  Significant Diagnostic Studies: angiography: Patient underwent stent placement to distal RCA.  PCI to the distal left main was aborted due to inability to pass 1.5 mm balloon.  Discharge Exam: Blood pressure (!) 133/56, pulse 91, temperature 97.7 F (36.5 C), temperature source Oral, resp. rate 12, height 5\' 9"  (1.753 m), weight 78.3 kg (172 lb 9.9 oz), SpO2 99 %.   Disposition: Discharge disposition: 01-Home or Self Care   Discharge Instructions    AMB Referral to Cardiac Rehabilitation - Phase II   Complete by:  As directed    Diagnosis:   NSTEMI Coronary Stents     Amb Referral to Cardiac Rehabilitation   Complete by:  As  directed    Diagnosis:  Coronary Stents   Call MD for:   Complete by:  As directed    Please call the MD if the symptoms worsen   Diet - low sodium heart healthy   Complete by:  As directed    Increase activity slowly   Complete by:  As directed      Allergies as of 07/13/2018   No Known Allergies     Medication List    STOP taking these medications   hydrochlorothiazide 12.5 MG capsule Commonly known as:  MICROZIDE   Magnesium 200 MG Tabs     TAKE these medications   amLODipine 2.5 MG tablet Commonly known as:  NORVASC Take 1 tablet (2.5 mg total) by mouth daily. What changed:    medication strength  how much to take   aspirin 81 MG EC tablet Take 81 mg by mouth at bedtime. Swallow whole.   atorvastatin 40 MG tablet Commonly known as:  LIPITOR Take 40 mg by mouth daily.   clopidogrel 75 MG tablet Commonly known as:  PLAVIX Take 1 tablet (75 mg total) by mouth daily. Start taking on:  07/14/2018   metoprolol succinate 50 MG 24 hr tablet Commonly known as:  TOPROL-XL Take 1 tablet (50 mg total) by mouth 2 (two) times daily. Take with or immediately following a meal. What changed:    when to take this  additional instructions   nitroGLYCERIN 0.4 MG SL tablet Commonly known as:  NITROSTAT Place 0.4 mg under the tongue every 5 (five) minutes as needed for chest pain.   ramipril 2.5 MG  capsule Commonly known as:  ALTACE Take 1 capsule (2.5 mg total) by mouth daily. What changed:    medication strength  how much to take      Follow-up Information    Rosalio MacadamiaGerhardt, Lori C, NP Follow up on 07/23/2018.   Specialties:  Nurse Practitioner, Interventional Cardiology, Cardiology, Radiology Why:  at 11:30am for your follow up appt.  Contact information: [address] [place] [address] [phone]          25 minutes spent discharging this patient.  SignedBarnetta Chapel: Jamison Soward I Thurza Kwiecinski 07/13/2018, 1:52 PM

## 2018-07-13 NOTE — Progress Notes (Addendum)
Progress Note  Patient Name: Robert [name] Date of Encounter: 07/13/2018  Primary Cardiologist: Tonny Bollman, MD  Subjective   Feels well this morning. No chest pain after ambulating around the room.   Inpatient Medications    Scheduled Meds: . amLODipine  10 mg Oral Daily  . aspirin EC  81 mg Oral QHS  . atorvastatin  40 mg Oral q1800  . clopidogrel  75 mg Oral Daily  . metoprolol succinate  50 mg Oral Daily  . ramipril  10 mg Oral Daily  . sodium chloride flush  3 mL Intravenous Q12H  . sodium chloride flush  3 mL Intravenous Q12H   Continuous Infusions: . sodium chloride    . sodium chloride     PRN Meds: sodium chloride, sodium chloride, acetaminophen, ALPRAZolam, nitroGLYCERIN, ondansetron (ZOFRAN) IV, sodium chloride flush, sodium chloride flush   Vital Signs    Vitals:   07/12/18 1918 07/12/18 2116 07/13/18 0557 07/13/18 0935  BP: 138/74 (!) 119/55 (!) 146/80 (!) 133/56  Pulse: 91 (!) 48 98 91  Resp:      Temp: 97.9 F (36.6 C) (!) 97.5 F (36.4 C) 97.7 F (36.5 C)   TempSrc: Oral Oral Oral   SpO2: 98% 100% 99%   Weight:   172 lb 9.9 oz (78.3 kg)   Height:        Intake/Output Summary (Last 24 hours) at 07/13/2018 0959 Last data filed at 07/13/2018 0551 Gross per 24 hour  Intake 532.16 ml  Output 1500 ml  Net -967.84 ml   Filed Weights   07/11/18 0414 07/12/18 0558 07/13/18 0557  Weight: 158 lb 1.6 oz (71.7 kg) 173 lb 11.2 oz (78.8 kg) 172 lb 9.9 oz (78.3 kg)    Telemetry    SR - Personally Reviewed  ECG    SR - Personally Reviewed  Physical Exam   General: Well developed, well nourished, male appearing in no acute distress. Head: Normocephalic, atraumatic.  Neck: Supple without bruits, JVD. Lungs:  Resp regular and unlabored, CTA. Heart: RRR, S1, S2, no murmur; no rub. Abdomen: Soft, non-tender, non-distended with normoactive bowel sounds.  Extremities: No clubbing, cyanosis, edema. Distal pedal pulses are 2+ bilaterally. Right  femoral cath site stable with mild bruising.  Neuro: Alert and oriented X 3. Moves all extremities spontaneously. Psych: Normal affect.  Labs    Chemistry Recent Labs  Lab 07/10/18 0648 07/11/18 0421 07/12/18 0542 07/13/18 0525  NA 140 140 141 142  K 3.9 3.9 3.4* 4.5  CL 106 108 111 111  CO2 25 22 21* 23  GLUCOSE 100* 109* 111* 109*  BUN 17 16 11 14   CREATININE 1.21 1.07 1.13 1.09  CALCIUM 9.1 9.1 8.6* 9.0  PROT 6.0*  --   --   --   ALBUMIN 3.4*  --   --  3.1*  AST 27  --   --   --   ALT 31  --   --   --   ALKPHOS 54  --   --   --   BILITOT 0.7  --   --   --   GFRNONAA 56* >60 >60 >60  GFRAA >60 >60 >60 >60  ANIONGAP 9 10 9 8      Hematology Recent Labs  Lab 07/11/18 0421 07/12/18 0542 07/13/18 0525  WBC 10.4 9.4 9.2  RBC 5.39 5.04 5.02  HGB 16.2 15.4 15.5  HCT 50.4 47.1 46.7  MCV 93.5 93.5 93.0  MCH 30.1 30.6 30.9  MCHC 32.1 32.7 33.2  RDW 13.4 13.2 13.4  PLT 308 286 257    Cardiac Enzymes Recent Labs  Lab 07/09/18 0110 07/09/18 0709 07/09/18 1309  TROPONINI <0.03 0.06* <0.03    Recent Labs  Lab 07/08/18 2121  TROPIPOC 0.00     BNPNo results for input(s): BNP, PROBNP in the last 168 hours.   DDimer No results for input(s): DDIMER in the last 168 hours.    Radiology    No results found.  Cardiac Studies   Cath: 07/12/18   #1: Post Atrio-1 lesion is 99% stenosed. Post Atrio-2 lesion is 85% stenosed.  Both lestions: Balloon angioplasty was performed using a BALLOON SAPPHIRE 2.0X12. - reducing to 20-30% residual stenosis  #2: Mid RCA to Dist RCA lesion is 95% stenosed.  A drug-eluting stent was successfully placed using a STENT SIERRA 2.50 X 15 MM. - post-dilated to 2.8 mm  Post intervention, there is a 0% residual stenosis.  #3: Prox RCA to Mid RCA stent is 60% stenosed.  Post intervention, there is a 10% residual in-stent stenosis.  Scoring balloon angioplasty was performed using a BALLOON SAPPHIRE Zena 3.0X10.  #4 Mid LM to Ost  LAD lesion is 90% stenosed with 90% stenosed side branch in Ost Cx. --> Ost Ramus lesion is 90% stenosed. - Unable to cross a BALLOON EUPHORA RX 1.5X10.  Ost LAD to Prox LAD lesion is 100% stenosed.  Prox Cx to Mid Cx lesion is 100% stenosed.  A drug-eluting stent was successfully placed using a STENT SIERRA 2.50 X 15 MM.   1. Successful DES stent of the distal RCA with PTCA of the RPA V and significant in-stent restenosis of the proximal to mid RCA. 2. Aborted attempted PCI of the distal Left Main-ostial RI due to inability to pass 1.5 mill meter balloon.  Plan: After discussion with Dr. Elyn Peers, with my inability to advance a 1.5 mm balloon beyond the midportion of the lesion in the distal Left Main-RI, I felt that I would therefore not be successful expanding stent.  He would likely require atherectomy. The plan will be to evaluate his symptoms in the morning following RCA PCI and if angina free with ambulation would potentially discharge home on medical management and potentially stage CSI PCI of the DLM-RI if symptoms warrant.  Continue aggressive cardiac risk factor medication by primary cardiology service.   Recommend dual antiplatelet therapy with Aspirin 81mg  daily and Clopidogrel 75mg  daily long-term (beyond 12 months) because of Extensive stented segment in the RCA with potential planned CSI PCI of the distal Left Main-ostial RI.Marland Kitchen  Patient Profile     77 y.o. male with a hx of CAD s/p CABG 2008, right subclavian stent 2016 (on Plavix), HTN, HLDwho was admitted with unstable angina. Underwent cath on 07/10/18 with follow up PCI done on 725/19.   Assessment & Plan    CAD with unstable angina: Cardiac cath 07/10/18 with severe three vessel CAD, patent LIMA graft and SVG to OM but occluded SVG to RCA and occluded SVG to presumed intermediate branch. Severe disease in mid RCA and PLA, distal left main and ramus ostium. Appropriately, Dr. Herbie Baltimore stopped after the diagnostic  cath last night to consider repeat CABG vs multi-vessel PCI. Case was reviewed with Dr. Herbie Baltimore as well as Dr. Excell Seltzer and Dr. Kirke Corin on the IC team. It was felt the best approach to revascularization would be with stenting of the left main into the intermediate branch and stenting of the Mid RCA  with balloon angioplasty of the PLA branch. Underwent cardiac cath noted above with with successful PCI/DES x1 to the dRCA and PTCA of the RPA V and significant in-stent restenosis of the proximal to mid RCA. Attempt made at the d Left Main-RI but unable to pass balloon. Decision made to treat medically unless recurrence of pain. Then consider arthrectomy in the future. He feels well this morning without chest pain.  -- on ASA, plavix, statin, BB and ACEi. DAPT for at least one year.  -- will arrange follow up in the office   Signed, Laverda PageLindsay Roberts, NP  07/13/2018, 9:59 AM  Pager # [phone]   For questions or updates, please contact [facility] Please consult [email or web address] for contact info under Cardiology/STEMI.  I have personally seen and examined this patient. I agree with the assessment and plan as outlined above.  He is doing well post PCI, stenting of the RCA and POBA of the PDA.  Continue ASA, Plavix, beta blocker.  Discharge home today.  Follow up in the office 2 weeks.   Verne CarrowChristopher Brandye Inthavong 07/13/2018 10:40 AM

## 2018-07-13 NOTE — Care Management Important Message (Signed)
Important Message  Patient Details  Name: Robert GreavesRichard Rylander MRN: [id number] Date of Birth: 1941-11-04   Medicare Important Message Given:  Yes    Nyan Dufresne P Joriel Streety 07/13/2018, 3:39 PM

## 2018-07-13 NOTE — Progress Notes (Addendum)
Laverda PageLindsay Roberts NP called backed and updated. NP to address and indicated she will  Correct and address it accordingly.  Pt has questions about his medication dosages . Dr. Dartha Lodgegbata called and updated and indicated to continue as is with d/c medication entered. Cardiology  Lillia AbedLindsay text page to clarify  Dosages. Pt left  And will follow-up with his meds via phone.

## 2018-07-13 NOTE — Progress Notes (Signed)
CARDIAC REHAB PHASE I   PRE:  Rate/Rhythm: 94 SR with PVCs  BP:  Sitting: 124/62      SaO2: 96 RA  MODE:  Ambulation: 470 ft 107 peak HR  POST:  Rate/Rhythm: 100 SR with PVCs  BP:  Sitting: 122/76    SaO2: 97 RA   Pt ambulated 47670ft in hallway standby assist. No c/o CP or SOB. Pt educated on importance of ASA, plavix, statin, and NTG. Pt given stent card and heart healthy diet. Reviewed restrictions and exercise guidelines. Will refer to CRP II [place].   [phone] Reynold Boweneresa  Sofi Bryars, RN BSN 07/13/2018 11:22 AM

## 2018-07-17 ENCOUNTER — Inpatient Hospital Stay (HOSPITAL_COMMUNITY)
Admission: EM | Admit: 2018-07-17 | Discharge: 2018-07-19 | DRG: 247 | Disposition: A | Discharge status: Home / Self Care | Payer: Medicare Other | Attending: Cardiovascular Disease | Admitting: Cardiovascular Disease

## 2018-07-17 ENCOUNTER — Emergency Department (HOSPITAL_COMMUNITY): Payer: Medicare Other

## 2018-07-17 ENCOUNTER — Telehealth (HOSPITAL_COMMUNITY): Payer: Self-pay

## 2018-07-17 ENCOUNTER — Encounter (HOSPITAL_COMMUNITY): Payer: Self-pay | Admitting: Emergency Medicine

## 2018-07-17 DIAGNOSIS — Z951 Presence of aortocoronary bypass graft: Secondary | ICD-10-CM

## 2018-07-17 DIAGNOSIS — Z955 Presence of coronary angioplasty implant and graft: Secondary | ICD-10-CM

## 2018-07-17 DIAGNOSIS — Z7982 Long term (current) use of aspirin: Secondary | ICD-10-CM

## 2018-07-17 DIAGNOSIS — Z8249 Family history of ischemic heart disease and other diseases of the circulatory system: Secondary | ICD-10-CM | POA: Diagnosis not present

## 2018-07-17 DIAGNOSIS — N183 Chronic kidney disease, stage 3 (moderate): Secondary | ICD-10-CM | POA: Diagnosis present

## 2018-07-17 DIAGNOSIS — I25110 Atherosclerotic heart disease of native coronary artery with unstable angina pectoris: Secondary | ICD-10-CM | POA: Diagnosis not present

## 2018-07-17 DIAGNOSIS — I200 Unstable angina: Secondary | ICD-10-CM

## 2018-07-17 DIAGNOSIS — R079 Chest pain, unspecified: Secondary | ICD-10-CM | POA: Diagnosis present

## 2018-07-17 DIAGNOSIS — N289 Disorder of kidney and ureter, unspecified: Secondary | ICD-10-CM | POA: Diagnosis not present

## 2018-07-17 DIAGNOSIS — E785 Hyperlipidemia, unspecified: Secondary | ICD-10-CM | POA: Diagnosis not present

## 2018-07-17 DIAGNOSIS — T82855A Stenosis of coronary artery stent, initial encounter: Secondary | ICD-10-CM | POA: Diagnosis present

## 2018-07-17 DIAGNOSIS — Z79899 Other long term (current) drug therapy: Secondary | ICD-10-CM

## 2018-07-17 DIAGNOSIS — I252 Old myocardial infarction: Secondary | ICD-10-CM | POA: Diagnosis not present

## 2018-07-17 DIAGNOSIS — R0602 Shortness of breath: Secondary | ICD-10-CM | POA: Diagnosis not present

## 2018-07-17 DIAGNOSIS — I493 Ventricular premature depolarization: Secondary | ICD-10-CM | POA: Diagnosis present

## 2018-07-17 DIAGNOSIS — I129 Hypertensive chronic kidney disease with stage 1 through stage 4 chronic kidney disease, or unspecified chronic kidney disease: Secondary | ICD-10-CM | POA: Diagnosis not present

## 2018-07-17 DIAGNOSIS — E7800 Pure hypercholesterolemia, unspecified: Secondary | ICD-10-CM | POA: Diagnosis present

## 2018-07-17 DIAGNOSIS — Z7902 Long term (current) use of antithrombotics/antiplatelets: Secondary | ICD-10-CM | POA: Diagnosis not present

## 2018-07-17 DIAGNOSIS — Z833 Family history of diabetes mellitus: Secondary | ICD-10-CM | POA: Diagnosis not present

## 2018-07-17 DIAGNOSIS — R0789 Other chest pain: Secondary | ICD-10-CM | POA: Diagnosis not present

## 2018-07-17 DIAGNOSIS — Y831 Surgical operation with implant of artificial internal device as the cause of abnormal reaction of the patient, or of later complication, without mention of misadventure at the time of the procedure: Secondary | ICD-10-CM | POA: Diagnosis present

## 2018-07-17 HISTORY — DX: Conduction disorder, unspecified: I45.9

## 2018-07-17 HISTORY — DX: Unspecified fracture of right foot, initial encounter for closed fracture: S92.901A

## 2018-07-17 HISTORY — DX: Unspecified osteoarthritis, unspecified site: M19.90

## 2018-07-17 LAB — CBC WITH DIFFERENTIAL/PLATELET
Abs Immature Granulocytes: 0 10*3/uL (ref 0.0–0.1)
Basophils Absolute: 0.1 10*3/uL (ref 0.0–0.1)
Basophils Relative: 1 %
EOS PCT: 4 %
Eosinophils Absolute: 0.4 10*3/uL (ref 0.0–0.7)
HEMATOCRIT: 44.7 % (ref 39.0–52.0)
HEMOGLOBIN: 15 g/dL (ref 13.0–17.0)
IMMATURE GRANULOCYTES: 0 %
LYMPHS ABS: 2.9 10*3/uL (ref 0.7–4.0)
LYMPHS PCT: 30 %
MCH: 30.9 pg (ref 26.0–34.0)
MCHC: 33.6 g/dL (ref 30.0–36.0)
MCV: 92 fL (ref 78.0–100.0)
Monocytes Absolute: 0.6 10*3/uL (ref 0.1–1.0)
Monocytes Relative: 6 %
Neutro Abs: 5.8 10*3/uL (ref 1.7–7.7)
Neutrophils Relative %: 59 %
Platelets: 278 10*3/uL (ref 150–400)
RBC: 4.86 MIL/uL (ref 4.22–5.81)
RDW: 13.4 % (ref 11.5–15.5)
WBC: 9.8 10*3/uL (ref 4.0–10.5)

## 2018-07-17 LAB — BASIC METABOLIC PANEL
ANION GAP: 9 (ref 5–15)
Anion gap: 10 (ref 5–15)
BUN: 23 mg/dL (ref 8–23)
BUN: 21 mg/dL (ref 8–23)
CALCIUM: 9.5 mg/dL (ref 8.9–10.3)
CALCIUM: 8.9 mg/dL (ref 8.9–10.3)
CO2: 22 mmol/L (ref 22–32)
CO2: 20 mmol/L — ABNORMAL LOW (ref 22–32)
CREATININE: 1.35 mg/dL — AB (ref 0.61–1.24)
Chloride: 107 mmol/L (ref 98–111)
Chloride: 108 mmol/L (ref 98–111)
Creatinine, Ser: 1.17 mg/dL (ref 0.61–1.24)
GFR calc non Af Amer: 49 mL/min — ABNORMAL LOW (ref >60)
GFR, EST AFRICAN AMERICAN: 57 mL/min — AB (ref >60)
GFR, EST NON AFRICAN AMERICAN: 58 mL/min — AB (ref >60)
GLUCOSE: 108 mg/dL — AB (ref 70–99)
Glucose, Bld: 179 mg/dL — ABNORMAL HIGH (ref 70–99)
Potassium: 4.3 mmol/L (ref 3.5–5.1)
Potassium: 3.9 mmol/L (ref 3.5–5.1)
Sodium: 139 mmol/L (ref 135–145)
Sodium: 137 mmol/L (ref 135–145)

## 2018-07-17 LAB — HEMOGLOBIN A1C
HEMOGLOBIN A1C: 5.8 % — AB (ref 4.8–5.6)
MEAN PLASMA GLUCOSE: 119.76 mg/dL

## 2018-07-17 LAB — CBC
HCT: 48.7 % (ref 39.0–52.0)
Hemoglobin: 16 g/dL (ref 13.0–17.0)
MCH: 31 pg (ref 26.0–34.0)
MCHC: 32.9 g/dL (ref 30.0–36.0)
MCV: 94.4 fL (ref 78.0–100.0)
PLATELETS: 308 10*3/uL (ref 150–400)
RBC: 5.16 MIL/uL (ref 4.22–5.81)
RDW: 13.5 % (ref 11.5–15.5)
WBC: 10.2 10*3/uL (ref 4.0–10.5)

## 2018-07-17 LAB — BRAIN NATRIURETIC PEPTIDE: B Natriuretic Peptide: 53.1 pg/mL (ref 0.0–100.0)

## 2018-07-17 LAB — TROPONIN I: Troponin I: 0.04 ng/mL (ref <0.03)

## 2018-07-17 LAB — I-STAT TROPONIN, ED: Troponin i, poc: 0.04 ng/mL (ref 0.00–0.08)

## 2018-07-17 MED ORDER — HEPARIN (PORCINE) IN NACL 100-0.45 UNIT/ML-% IJ SOLN
1100.0000 [IU]/h | INTRAMUSCULAR | Status: DC
  Administered 2018-07-17: 1100 [IU]/h via INTRAVENOUS
  Filled 2018-07-17: qty 250

## 2018-07-17 MED ORDER — ASPIRIN 81 MG PO CHEW
81.0000 mg | CHEWABLE_TABLET | ORAL | Status: AC
Start: 2018-07-18 — End: 2018-07-18
  Administered 2018-07-18: 81 mg via ORAL
  Filled 2018-07-17: qty 1

## 2018-07-17 MED ORDER — SODIUM CHLORIDE 0.9 % IV SOLN
INTRAVENOUS | Status: DC
  Administered 2018-07-18: 04:00:00 via INTRAVENOUS

## 2018-07-17 MED ORDER — SODIUM CHLORIDE 0.9% FLUSH: 3.0000 mL | Freq: Two times a day (BID) | INTRAVENOUS | Status: DC

## 2018-07-17 MED ORDER — SODIUM CHLORIDE 0.9 % IV SOLN: 250.0000 mL | INTRAVENOUS | Status: DC | PRN

## 2018-07-17 MED ORDER — HEPARIN BOLUS VIA INFUSION
4000.0000 [IU] | Freq: Once | INTRAVENOUS | Status: AC
Start: 2018-07-17 — End: 2018-07-17
  Administered 2018-07-17: 4000 [IU] via INTRAVENOUS
  Filled 2018-07-17: qty 4000

## 2018-07-17 MED ORDER — METOPROLOL SUCCINATE ER 50 MG PO TB24
50.0000 mg | ORAL_TABLET | Freq: Every day | ORAL | Status: DC
  Administered 2018-07-18 – 2018-07-19 (×2): 50 mg via ORAL
  Filled 2018-07-17 (×2): qty 1

## 2018-07-17 MED ORDER — ONDANSETRON HCL 4 MG/2ML IJ SOLN
4.0000 mg | Freq: Four times a day (QID) | INTRAMUSCULAR | Status: DC | PRN
  Filled 2018-07-17: qty 2

## 2018-07-17 MED ORDER — AMLODIPINE BESYLATE 5 MG PO TABS
5.0000 mg | ORAL_TABLET | Freq: Every day | ORAL | Status: DC
  Administered 2018-07-18 – 2018-07-19 (×2): 5 mg via ORAL
  Filled 2018-07-17 (×3): qty 1

## 2018-07-17 MED ORDER — ASPIRIN EC 81 MG PO TBEC: 81.0000 mg | DELAYED_RELEASE_TABLET | Freq: Every day | ORAL | Status: DC

## 2018-07-17 MED ORDER — ASPIRIN 81 MG PO CHEW
324.0000 mg | CHEWABLE_TABLET | ORAL | Status: AC
  Administered 2018-07-17: 324 mg via ORAL
  Filled 2018-07-17: qty 4

## 2018-07-17 MED ORDER — CLOPIDOGREL BISULFATE 75 MG PO TABS
75.0000 mg | ORAL_TABLET | Freq: Every day | ORAL | Status: DC
  Administered 2018-07-18 – 2018-07-19 (×2): 75 mg via ORAL
  Filled 2018-07-17 (×2): qty 1

## 2018-07-17 MED ORDER — ACETAMINOPHEN 325 MG PO TABS: 650.0000 mg | ORAL_TABLET | ORAL | Status: DC | PRN

## 2018-07-17 MED ORDER — SODIUM CHLORIDE 0.9% FLUSH: 3.0000 mL | INTRAVENOUS | Status: DC | PRN

## 2018-07-17 MED ORDER — ATORVASTATIN CALCIUM 80 MG PO TABS
80.0000 mg | ORAL_TABLET | Freq: Every day | ORAL | Status: DC
  Administered 2018-07-18: 19:00:00 80 mg via ORAL
  Filled 2018-07-17: qty 1

## 2018-07-17 MED ORDER — ASPIRIN EC 81 MG PO TBEC
81.0000 mg | DELAYED_RELEASE_TABLET | Freq: Every day | ORAL | Status: DC
Start: 2018-07-18 — End: 2018-07-17

## 2018-07-17 MED ORDER — ASPIRIN 300 MG RE SUPP: 300.0000 mg | RECTAL | Status: AC

## 2018-07-17 MED ORDER — NITROGLYCERIN IN D5W 200-5 MCG/ML-% IV SOLN
0.0000 ug/min | INTRAVENOUS | Status: DC
  Administered 2018-07-17: 5 ug/min via INTRAVENOUS
  Filled 2018-07-17: qty 250

## 2018-07-17 NOTE — ED Triage Notes (Signed)
Pt has been having chest pressure that radiates into his left arm that started two hours ago. Pt also said bilateral legs feel week. Pt recently had stents placed; discharged this past Friday.

## 2018-07-17 NOTE — ED Provider Notes (Signed)
[facility] Provider Note   CSN: [id number] Arrival date & time: 07/17/18  1559     History   Chief Complaint Chief Complaint  Patient presents with  . Chest Pain    HPI Robert [name] is a 77 y.o. male.  He presents emergency department complaining of substernal chest pain that started around 2 PM at rest.  It radiated into his left arm with some tingling.  He said it felt a little bit short of breath with it.  It was not associated with any nausea.  There is no diaphoresis.  His pain has improved on its own to now 2 out of 10.  He is been in the hospital twice recently for chest pain and was admitted on the 21st and had stents placed.  He was told by cardiology that he has another lesion that may require intervention.  The history is provided by the patient.  Chest Pain   This is a recurrent problem. The current episode started 3 to 5 hours ago. The problem occurs constantly. The problem has been gradually improving. The pain is associated with rest. The pain is present in the substernal region. The pain is at a severity of 8/10. The quality of the pain is described as pressure-like. The pain radiates to the left arm. Associated symptoms include dizziness, irregular heartbeat, palpitations and shortness of breath. Pertinent negatives include no abdominal pain, no back pain, no cough, no diaphoresis, no fever, no headaches, no hemoptysis, no nausea, no sputum production and no syncope. He has tried rest for the symptoms. The treatment provided moderate relief.  His past medical history is significant for CAD, hyperlipidemia and hypertension.    Past Medical History:  Diagnosis Date  . CAD (coronary artery disease)    a. CABG X4 2008. b. [place]  . Fracture 1996   FOOT  . High cholesterol   . Hypertension   . Leg hematoma 01/2018  . Presence of arterial stent 2016   Right subclavian artery  . Stenosis of subclavian artery (HCC)    a. s/p R subclavian stent 2016.    Patient Active Problem List   Diagnosis Date Noted  . Palpitations   . Unstable angina (HCC)   . Chest pain 07/09/2018  . CAD (coronary artery disease) 05/25/2018  . Essential (primary) hypertension 05/25/2018  . Hyperlipidemia LDL goal <70 05/25/2018    Past Surgical History:  Procedure Laterality Date  . AORTIC ARCH ANGIOGRAPHY N/A 07/10/2018   Procedure: AORTIC ARCH ANGIOGRAPHY;  Surgeon: Marykay LexHarding, David W, MD;  [facility];  Service: Cardiovascular;  Laterality: N/A;  . cardiac stents    . CARDIAC SURGERY    . CORONARY ARTERY BYPASS GRAFT  2008  . CORONARY STENT INTERVENTION N/A 07/12/2018   Procedure: CORONARY STENT INTERVENTION;  Surgeon: Marykay LexHarding, David W, MD;  [facility];  Service: Cardiovascular;  Laterality: N/A;  . LEFT HEART CATH AND CORS/GRAFTS ANGIOGRAPHY N/A 07/10/2018   Procedure: LEFT HEART CATH AND CORS/GRAFTS ANGIOGRAPHY;  Surgeon: Marykay LexHarding, David W, MD;  [facility];  Service: Cardiovascular;  Laterality: N/A;  . PROSTATE SURGERY          Home Medications    Prior to Admission medications   Medication Sig Start Date End Date Taking? Authorizing Provider  amLODipine (NORVASC) 2.5 MG tablet Take 1 tablet (2.5 mg total) by mouth daily. 07/13/18   Barnetta Chapelgbata, Sylvester I, MD  aspirin  81 MG EC tablet Take 81 mg by mouth at bedtime. Swallow whole.     [provider]  atorvastatin (LIPITOR) 40 MG tablet Take 40 mg by mouth daily.    [provider]  clopidogrel (PLAVIX) 75 MG tablet Take 1 tablet (75 mg total) by mouth daily. 07/14/18   Barnetta Chapel, MD  metoprolol succinate (TOPROL-XL) 50 MG 24 hr tablet Take 1 tablet (50 mg total) by mouth 2 (two) times daily. Take with or immediately following a meal. Patient taking differently: Take 50 mg by mouth daily. Take with or immediately following a meal. 07/05/18   Tonny Bollman, MD  nitroGLYCERIN (NITROSTAT) 0.4 MG  SL tablet Place 0.4 mg under the tongue every 5 (five) minutes as needed for chest pain.  03/28/18   [provider]  ramipril (ALTACE) 2.5 MG capsule Take 1 capsule (2.5 mg total) by mouth daily. 07/13/18   Barnetta Chapel, MD    Family History Family History  Problem Relation Age of Onset  . Healthy Mother        lived to be 34  . CAD Father 16  . Diabetes Brother   . Hypertension Brother   . CAD Brother 12       CABG    Social History Social History   Tobacco Use  . Smoking status: Never Smoker  . Smokeless tobacco: Never Used  Substance Use Topics  . Alcohol use: Yes  . Drug use: No     Allergies   Patient has no known allergies.   Review of Systems Review of Systems  Constitutional: Negative for diaphoresis and fever.  HENT: Negative for sore throat.   Eyes: Negative for visual disturbance.  Respiratory: Positive for shortness of breath. Negative for cough, hemoptysis and sputum production.   Cardiovascular: Positive for chest pain and palpitations. Negative for syncope.  Gastrointestinal: Negative for abdominal pain and nausea.  Genitourinary: Negative for dysuria.  Musculoskeletal: Negative for back pain.  Skin: Negative for rash.  Neurological: Positive for dizziness. Negative for headaches.     Physical Exam Updated Vital Signs BP (!) 141/87 (BP Location: Left Arm)   Pulse 92   Temp (!) 97.5 F (36.4 C) (Oral)   Resp 16   Ht 5\' 9"  (1.753 m)   Wt 78 kg (172 lb)   SpO2 100%   BMI 25.40 kg/m   Physical Exam  Constitutional: He appears well-developed and well-nourished.  HENT:  Head: Normocephalic and atraumatic.  Eyes: Conjunctivae are normal.  Neck: Neck supple.  Cardiovascular: Normal rate and regular rhythm.  No murmur heard. Pulmonary/Chest: Effort normal and breath sounds normal. No respiratory distress.  Abdominal: Soft. There is no tenderness.  Musculoskeletal: He exhibits no edema.       Right lower leg: He exhibits no  tenderness and no edema.       Left lower leg: He exhibits no tenderness and no edema.  Got some bruising on his left wrist from prior cardiac cath.  Neurological: He is alert.  Skin: Skin is warm and dry.  Psychiatric: He has a normal mood and affect.  Nursing note and vitals reviewed.    ED Treatments / Results  Labs (all labs ordered are listed, but only abnormal results are displayed) Labs Reviewed  BASIC METABOLIC PANEL - Abnormal; Notable for the following components:      Result Value   Glucose, Bld 108 (*)    Creatinine, Ser 1.35 (*)    GFR calc non  Af Amer 49 (*)    GFR calc Af Amer 57 (*)    All other components within normal limits  CBC  BRAIN NATRIURETIC PEPTIDE  I-STAT TROPONIN, ED    EKG EKG Interpretation  Date/Time:  Tuesday July 17 2018 16:07:49 EDT Ventricular Rate:  94 PR Interval:  134 QRS Duration: 78 QT Interval:  360 QTC Calculation: 450 R Axis:   31 Text Interpretation:  Sinus rhythm with occasional Premature ventricular complexes Possible Left atrial enlargement Borderline ECG increased ectopy from prior 7/19 Confirmed by Meridee Score [phone]) on 07/17/2018 4:42:10 PM   Radiology Dg Chest 2 View  Result Date: 07/17/2018 CLINICAL DATA:  Chest pain.  Shortness of breath. EXAM: CHEST - 2 VIEW COMPARISON:  07/08/2018. FINDINGS: Prior CABG. Heart size normal. Mild bibasilar subsegmental atelectasis and/or scarring again noted. No pleural effusion or pneumothorax. IMPRESSION: 1.  Mild bibasilar subsegmental atelectasis and/or scarring. 2.  Prior CABG.  Heart size normal. Electronically Signed   By: Maisie Fus  Register   On: 07/17/2018 17:21    Procedures Procedures (including critical care time)  Medications Ordered in ED Medications - No data to display   Initial Impression / Assessment and Plan / ED Course  I have reviewed the triage vital signs and the nursing notes.  Pertinent labs & imaging results that were available during my care of the  patient were reviewed by me and considered in my medical decision making (see chart for details).  Clinical Course as of Jul 18 1125  Tue Jul 17, 2018  4526 77 year old male with known CAD here for recurrent chest pain.  Patient just had a cath last week with stents.  Concern for unstable angina.  His EKG is unchanged first troponin negative.  Will call cardiology for their input.  Pain is currently a 2 out of 10.   [MB]  1858 Back with the patient.  He has been seen by cardiology and he feels that they were talking to him about admitting him and starting him on some blood thinner.  Still awaiting to hear back from cardiology regarding plan.   [MB]    Clinical Course User Index [MB] Terrilee Files, MD    Final Clinical Impressions(s) / ED Diagnoses   Final diagnoses:  Unstable angina [facility])    ED Discharge Orders    None       Terrilee Files, MD 07/18/18 [phone]

## 2018-07-17 NOTE — Telephone Encounter (Signed)
Made initial call to patient in regards to Cardiac rehab to see if he is interested in the program. Patient stated he is interested. Explained scheduling process and went over insurance, patient verbalized understanding. Will contact patient for scheduling once follow up appt has been completed.

## 2018-07-17 NOTE — Telephone Encounter (Signed)
Patients insurance is active and benefits verified through Medicare A/B - No co-pay, deductible amount of $185.00/$185.00 has been met, no out of pocket, 20% co-insurance, and no pre-authorization is required.   Patients insurance is active through [employer] - Passport/reference [phone]

## 2018-07-17 NOTE — ED Notes (Signed)
Cardiology at bedside.

## 2018-07-17 NOTE — ED Provider Notes (Signed)
Patient placed in Quick Look pathway, seen and evaluated   Chief Complaint: CP  HPI:   Pt states he felt very poorly yesterday,a nd today has had central CP/chest pressure. It is constant, does not radiate. Feels similar to how he felt before he had a cath. Recent heart cath, 07/25. Felt good upon discharge 4 days ago. Denies fall or injury. On plavix. Has mild/intermittent associated SOB. No associated n/v. No abd pain, leg pain or swelling.   ROS: CP  Physical Exam:   Gen: No distress  Neuro: Awake and Alert  Skin: Warm    Focused Exam: RRR. CTAB. No leg pain/swelling  Labs, ekg, cxr  Initiation of care has begun. The patient has been counseled on the process, plan, and necessity for staying for the completion/evaluation, and the remainder of the medical screening examination    Alveria ApleyCaccavale, Kordae Buonocore, PA-C 07/17/18 1651    Terrilee FilesButler, Michael C, MD 07/18/18 [phone]

## 2018-07-17 NOTE — Progress Notes (Signed)
ANTICOAGULATION CONSULT NOTE - Initial Consult  Pharmacy Consult for heparin Indication: chest pain/ACS  No Known Allergies  Patient Measurements: Height: 5\' 9"  (175.3 cm) Weight: 172 lb (78 kg) IBW/kg (Calculated) : 70.7 Heparin Dosing Weight: 78 kg  Vital Signs: Temp: 97.5 F (36.4 C) (07/30 1604) Temp Source: Oral (07/30 1604) BP: 122/81 (07/30 1930) Pulse Rate: 92 (07/30 1930)  Labs: Recent Labs    07/17/18 1617  HGB 16.0  HCT 48.7  PLT 308  CREATININE 1.35*    Estimated Creatinine Clearance: 45.8 mL/min (A) (by C-G formula based on SCr of 1.35 mg/dL (H)).   Medical History: Past Medical History:  Diagnosis Date  . CAD (coronary artery disease)    a. CABG X4 2008. b. MI and stent (in [place])  . Fracture 1996   FOOT  . High cholesterol   . Hypertension   . Leg hematoma 01/2018  . Presence of arterial stent 2016   Right subclavian artery  . Stenosis of subclavian artery (HCC)    a. s/p R subclavian stent 2016.   Assessment: [age] yoM with chest pain who underwent repeat cardiac cath ~ 1 week ago. Patient will likely go for PCI with atherectomy in the morning. No anticoagulation prior to admit. Pharmacy consulted to dose heparin. During last admit patient was on heparin infusion at 1300 units/hr. Will give bolus and initiate infusion at 14 units/kg then increase if needed.   Goal of Therapy:  Heparin level 0.3-0.7 units/ml Monitor platelets by anticoagulation protocol: Yes   Plan:  Give 4000 units bolus x 1 Start heparin infusion at 1100 units/hr Check anti-Xa level in 6 hours and daily while on heparin Continue to monitor H&H and platelets  Ruben Imony Timiko Offutt, PharmD Clinical Pharmacist 07/17/2018 8:00 PM Please check AMION for all [facility] numbers

## 2018-07-17 NOTE — ED Notes (Signed)
Patient transported to X-ray 

## 2018-07-17 NOTE — H&P (Addendum)
History & Physical    Patient ID: Robert GreavesRichard [name] MRN: [id number], DOB/AGE: 77-Aug-1942   Admit date: 07/17/2018   Primary Physician: Georgann HousekeeperHusain, Karrar, MD Primary Cardiologist:   Patient Profile    Robert [name] is a 77 y.o. male with a hx of CAD s/p CABG 2008, right subclavian stent 2016 (on Plavix), HTN, HLD  who is being seen today for the evaluation of chest pain.   Past Medical History   Past Medical History:  Diagnosis Date  . CAD (coronary artery disease)    a. CABG X4 2008. b. MI and stent (in [place])  . Fracture 1996   FOOT  . High cholesterol   . Hypertension   . Leg hematoma 01/2018  . Presence of arterial stent 2016   Right subclavian artery  . Stenosis of subclavian artery (HCC)    a. s/p R subclavian stent 2016.    Past Surgical History:  Procedure Laterality Date  . AORTIC ARCH ANGIOGRAPHY N/A 07/10/2018   Procedure: AORTIC ARCH ANGIOGRAPHY;  Surgeon: Marykay LexHarding, David W, MD;  [facility];  Service: Cardiovascular;  Laterality: N/A;  . cardiac stents    . CARDIAC SURGERY    . CORONARY ARTERY BYPASS GRAFT  2008  . CORONARY STENT INTERVENTION N/A 07/12/2018   Procedure: CORONARY STENT INTERVENTION;  Surgeon: Marykay LexHarding, David W, MD;  [facility];  Service: Cardiovascular;  Laterality: N/A;  . LEFT HEART CATH AND CORS/GRAFTS ANGIOGRAPHY N/A 07/10/2018   Procedure: LEFT HEART CATH AND CORS/GRAFTS ANGIOGRAPHY;  Surgeon: Marykay LexHarding, David W, MD;  [facility];  Service: Cardiovascular;  Laterality: N/A;  . PROSTATE SURGERY      Allergies  No Known Allergies  History of Present Illness    Mr. Robert [name] is 77 year old male with a history stated above who was seen in the office initially in June 2019 to establish care. [place] [facility] [place]. Per chart review, he underwent CABG x4 after being evaluated due to his brother needing bypass surgery.   He denied ever having any symptoms or heart attack. Since  then he has had an MI and stent several years later, details unclear and records unavailable.  The patient does not remember having specific anginal symptoms before.  Apparently, most recently his prior cardiologist wanted to do a stress test since his CABG was approximately 10 years ago but the patient deferred and wanted to wait until he moved.  He was asymptomatic at the time of that visit 05/25/2018.  He underwent a nuclear stress test 06/06/2018 which showed large size, moderate intensity fixed inferior septal and inferior lateral perfusion defects, worse at rest than stress, suspicious for artifact, no reversible ischemia, EF 70% with normal wall motion, low risk study.  He exercised approximately 4 minutes and had PACs, PVCs in recovery.  Of note, he had a history of spontaneous calf hematoma in 01/2018 to the plan was that if the stress test was normal, it would be okay to stop his Plavix.  After this office visit, he had intermittent chest pain for approximately 1 month which had atypical and typical features.  He had a nonischemic nuc recently, but this was while asymptomatic. His exercise tolerance with severely reduced at the time despite reports of regular exercise.  This was his second presentation in a week to the emergency department for chest pressure/pain.  Given his symptoms, plan was to confirm with definitive cath for 07/10/2018.  Catheterization revealed significant native CAD with 90% distal left main stenosis extending into a ramus intermediate vessel, total ostial occlusion of the LAD, 90% ostial stenosis of the circumflex with total occlusion of the mid vessel, and 60% in-stent restenosis in the RCA stent with 95% distal RCA stenosis followed by 99% stenosis after the PDA takeoff.  The SVG to the RCA was occluded as was and SVG to the ramus intermediate vessel.  He had a patent LIMA graft to the LAD and a patent vein graft to obtuse marginal vessel.  Here underwent successful staged PCI 2  days later to 3 sites in his RCA and there was an initial attempt at intervention to the distal left main into the ramus vessel but a 1.5 mm balloon was unable to cross the stenosis due to significant calcification and attempted intervention was aborted with plans for potential future atherectomy depending upon symptoms status.  He was discharged 1 week ago on a medical regimen consisting of aspirin and Plavix, amlodipine 2.5 mg, ramipril 10 mg, Toprol 50 mg.  Since discharge, he began to notice some recurrent chest pain symptomatology over the weekend.  Today his chest pain became more intense and was 8 out of 10 in character and lasted for several hours with ultimate resolution.  He presents to the emergency room for evaluation   Home Medications    Prior to Admission medications   Medication Sig Start Date End Date Taking? Authorizing Provider  amLODipine (NORVASC) 2.5 MG tablet Take 1 tablet (2.5 mg total) by mouth daily. 07/13/18   Berton Mount I, MD  aspirin 81 MG EC tablet Take 81 mg by mouth at bedtime. Swallow whole.     [provider]  atorvastatin (LIPITOR) 40 MG tablet Take 40 mg by mouth daily.    [provider]  clopidogrel (PLAVIX) 75 MG tablet Take 1 tablet (75 mg total) by mouth daily. 07/14/18   Barnetta Chapel, MD  metoprolol succinate (TOPROL-XL) 50 MG 24 hr tablet Take 1 tablet (50 mg total) by mouth 2 (two) times daily. Take with or immediately following a meal. Patient taking differently: Take 50 mg by mouth daily. Take with or immediately following a meal. 07/05/18   Tonny Bollman, MD  nitroGLYCERIN (NITROSTAT) 0.4 MG SL tablet Place 0.4 mg under the tongue every 5 (five) minutes as needed for chest pain.  03/28/18   [provider]  ramipril (ALTACE) 2.5 MG capsule Take 1 capsule (2.5 mg total) by mouth daily. 07/13/18   Barnetta Chapel, MD    Family History    Family History  Problem Relation Age of Onset  . Healthy Mother         lived to be 17  . CAD Father 84  . Diabetes Brother   . Hypertension Brother   . CAD Brother 53       CABG    Social History    Social History   Socioeconomic History  . Marital status: Married    Spouse name: Not on file  . Number of children: 2  . Years of education: COLLEGE  . Highest education level: Not on file  Occupational History  . Occupation: RETIRED  Social Needs  . Financial resource strain: Not on file  . Food insecurity:    Worry: Not on file    Inability: Not on file  . Transportation needs:    Medical: Not on file    Non-medical: Not on file  Tobacco Use  .  Smoking status: Never Smoker  . Smokeless tobacco: Never Used  Substance and Sexual Activity  . Alcohol use: Yes  . Drug use: No  . Sexual activity: Not on file  Lifestyle  . Physical activity:    Days per week: Not on file    Minutes per session: Not on file  . Stress: Not on file  Relationships  . Social connections:    Talks on phone: Not on file    Gets together: Not on file    Attends religious service: Not on file    Active member of club or organization: Not on file    Attends meetings of clubs or organizations: Not on file    Relationship status: Not on file  . Intimate partner violence:    Fear of current or ex partner: Not on file    Emotionally abused: Not on file    Physically abused: Not on file    Forced sexual activity: Not on file  Other Topics Concern  . Not on file  Social History Narrative  . Not on file     Review of Systems   General: Denies fevers, chills, myalgias, decreased appetite and fatigue. Reports no changes in sleep habits or weight. Admits to good overall health. Skin: Denies abnormal rashes, lesions, or masses. Cardiovascular: Denies chest pain, palpitations, and SOB at rest or with exertion. Denies lower extremity swelling.  Respiratory- Denies SOB, wheezing, coughing, increased respirations, or sputum production. No reports of hemoptysis, asthma, or  recent bronchitis. Denies the need for extra pillows for sleep at night.  HEENT: Denies visual acuity changes, eye pain, redness, or discharge. Reports adequate hearing acuity bilaterally. Denies drainage, tinnitus, vertigo, or recent earaches. Denies sore throat, hoarseness, or trouble swallowing. Adequate oral dentition.  Gastrointestinal: Denies abdominal, epigastric pain, nausea and vomiting. No reports of heartburn, indigestion, liver or gallbladder abnormalities. Reports regular, consistent bowel movements, without signs and symptoms of diarrhea, constipation, or blood in the stools.  Genitourinary: Denies frequency, urgency, retention, or urinary incontinence. No reports of recent UTI, dysuria, or blood in urine.  Neurological: Denies headaches, syncope, falls, extremity weakness, numbness, and tingling. No changes in mental status. Denies abnormal anxiousness or restlessness.  All other systems reviewed and are otherwise negative except as noted above.  Physical Exam    BP 129/65   Pulse (!) 32   Temp (!) 97.5 F (36.4 C) (Oral)   Resp 16   Ht 5\' 9"  (1.753 m)   Wt 172 lb (78 kg)   SpO2 100%   BMI 25.40 kg/m  General: Alert, oriented, no distress.  Skin: normal turgor, no rashes, warm and dry HEENT: Normocephalic, atraumatic. Pupils equal round and reactive to light; sclera anicteric; extraocular muscles intact;  Nose without nasal septal hypertrophy Mouth/Parynx benign; Mallinpatti scale 3 Neck: No JVD, no carotid bruits; normal carotid upstroke Lungs: clear to ausculatation and percussion; no wheezing or rales Chest wall: without tenderness to palpitation Heart: PMI not displaced, RRR, s1 s2 normal, 1/6 systolic murmur, no diastolic murmur, no rubs, gallops, thrills, or heaves Abdomen: soft, nontender; no hepatosplenomehaly, BS+; abdominal aorta nontender and not dilated by palpation. Back: no CVA tenderness Pulses 2+: Ecchymosis at left radial site Musculoskeletal: full  range of motion, normal strength, no joint deformities Extremities: no clubbing cyanosis or edema, Homan's sign negative  Neurologic: grossly nonfocal; Cranial nerves grossly wnl Psychologic: Normal mood and affect   Labs    Troponin (Point of Care Test) Recent Labs  07/17/18 1622  TROPIPOC 0.04   No results for input(s): CKTOTAL, CKMB, TROPONINI in the last 72 hours. Lab Results  Component Value Date   WBC 10.2 07/17/2018   HGB 16.0 07/17/2018   HCT 48.7 07/17/2018   MCV 94.4 07/17/2018   PLT 308 07/17/2018    Recent Labs  Lab 07/17/18 1617  NA 139  K 4.3  CL 107  CO2 22  BUN 23  CREATININE 1.35*  CALCIUM 9.5  GLUCOSE 108*   Lab Results  Component Value Date   CHOL 110 07/10/2018   HDL 33 (L) 07/10/2018   LDLCALC 57 07/10/2018   TRIG 100 07/10/2018   No results found for: [facility]   Radiology Studies    Dg Chest 2 View  Result Date: 07/17/2018 CLINICAL DATA:  Chest pain.  Shortness of breath. EXAM: CHEST - 2 VIEW COMPARISON:  07/08/2018. FINDINGS: Prior CABG. Heart size normal. Mild bibasilar subsegmental atelectasis and/or scarring again noted. No pleural effusion or pneumothorax. IMPRESSION: 1.  Mild bibasilar subsegmental atelectasis and/or scarring. 2.  Prior CABG.  Heart size normal. Electronically Signed   By: Maisie Fus  Register   On: 07/17/2018 17:21   Dg Chest 2 View  Result Date: 07/08/2018 CLINICAL DATA:  Chest pain and shortness of breath EXAM: CHEST - 2 VIEW COMPARISON:  July 05, 2018 FINDINGS: There is no appreciable edema or consolidation. Heart size and pulmonary vascularity are normal. No adenopathy. Patient is status post internal mammary bypass grafting. There is aortic atherosclerosis. No evident bone lesions. IMPRESSION: Postoperative changes. Aortic atherosclerosis. No edema or consolidation. Aortic Atherosclerosis (ICD10-I70.0). Electronically Signed   By: Bretta Bang III M.D.   On: 07/08/2018 21:35   Dg Chest 2 View  Result Date:  07/05/2018 CLINICAL DATA:  Chest pain EXAM: CHEST - 2 VIEW COMPARISON:  None. FINDINGS: Lungs are clear. Heart size and pulmonary vascularity are normal. No adenopathy. Patient is status post internal mammary bypass grafting. No pneumothorax. No evident bone lesions. IMPRESSION: Status post internal mammary bypass grafting. No edema or consolidation. Electronically Signed   By: Bretta Bang III M.D.   On: 07/05/2018 10:23    ECG (independently read by me): NSR at 94; PVCs    Cardiac Imaging      Assessment & Plan    1. Unstable Angina with known CAD, status post CABG surgery: The patient had undergone repeat cardiac catheterization 1 week ago by Dr. Herbie Baltimore as depicted above. PCI to 3 sites in his RCA was successful but the  attempt at intervention to the distal left main high-grade calcified stenosis was unsuccessful and ultimately aborted due to significant calcification and feeling of an inadequate stent expansion without debulking with atherectomy.  Patient has a patent LAD supplied by the LIMA and a patent vein graft to a circumflex marginal vessel.  We will admit the patient with unstable angina, initiate IV heparin, IV nitroglycerin.  Hold ramipril in a.m. in anticipation of possible PCI with atherectomy of the left main stenting into the ramus tomorrow.  Consider adding ranolazine.  2.  Essential hypertension.  The patient had been on beta-blocker, ramipril 10 , and amlodipine 10 mg which were reduced at his last hospitalization and discharge summary.  Optimal blood pressure less than 130/80.  3.  PVCs.  Will titrate beta-blocker therapy as blood pressure allows.  4.  Mild renal insufficiency, creatinine 1.35 today compatible with stage III chronic kidney disease.  5. HLD: On atorvastatin.  Most recent LDL 57.  I discussed  attempted intervention with the patient and his family.  We will discuss with colleagues in a.m. plan for atherectomy of the calcified distal left main if the  schedule allows.  Patient is aware of risk benefits of the procedure.  Georgie Chard, [facility] Nicki Guadalajara, MD  07/17/2018  7:09 PM  Severity of Illness: The appropriate patient status for this patient is INPATIENT. Inpatient status is judged to be reasonable and necessary in order to provide the required intensity of service to ensure the patient's safety. The patient's presenting symptoms, physical exam findings, and initial radiographic and laboratory data in the context of their chronic comorbidities is felt to place them at high risk for further clinical deterioration. Furthermore, it is not anticipated that the patient will be medically stable for discharge from the hospital within 2 midnights of admission. The following factors support the patient status of inpatient.   " The patient's presenting symptoms include unstable angina. " The worrisome physical exam findings include none. " The initial radiographic and laboratory data are worrisome because of recent cath and attempted PCI. " The chronic co-morbidities include s/p CABG.   * I certify that at the point of admission it is my clinical judgment that the patient will require inpatient hospital care spanning beyond 2 midnights from the point of admission due to high intensity of service, high risk for further deterioration and high frequency of surveillance required.*    For questions or updates, please contact [facility] Please consult [email or web address] for contact info under Cardiology/STEMI.    Signed, Nicki Guadalajara, MD  07/17/2018 7:17 PM

## 2018-07-18 ENCOUNTER — Encounter (HOSPITAL_COMMUNITY)
Admission: EM | Disposition: A | Discharge status: Home / Self Care | Payer: Self-pay | Source: Home / Self Care | Attending: Cardiovascular Disease

## 2018-07-18 ENCOUNTER — Other Ambulatory Visit: Payer: Self-pay

## 2018-07-18 DIAGNOSIS — I129 Hypertensive chronic kidney disease with stage 1 through stage 4 chronic kidney disease, or unspecified chronic kidney disease: Secondary | ICD-10-CM | POA: Diagnosis present

## 2018-07-18 DIAGNOSIS — E785 Hyperlipidemia, unspecified: Secondary | ICD-10-CM

## 2018-07-18 DIAGNOSIS — N183 Chronic kidney disease, stage 3 (moderate): Secondary | ICD-10-CM | POA: Diagnosis present

## 2018-07-18 DIAGNOSIS — I25110 Atherosclerotic heart disease of native coronary artery with unstable angina pectoris: Principal | ICD-10-CM

## 2018-07-18 DIAGNOSIS — Z7982 Long term (current) use of aspirin: Secondary | ICD-10-CM | POA: Diagnosis not present

## 2018-07-18 DIAGNOSIS — Z833 Family history of diabetes mellitus: Secondary | ICD-10-CM | POA: Diagnosis not present

## 2018-07-18 DIAGNOSIS — T82855A Stenosis of coronary artery stent, initial encounter: Secondary | ICD-10-CM | POA: Diagnosis present

## 2018-07-18 DIAGNOSIS — R0789 Other chest pain: Secondary | ICD-10-CM | POA: Diagnosis not present

## 2018-07-18 DIAGNOSIS — Z79899 Other long term (current) drug therapy: Secondary | ICD-10-CM | POA: Diagnosis not present

## 2018-07-18 DIAGNOSIS — I252 Old myocardial infarction: Secondary | ICD-10-CM | POA: Diagnosis not present

## 2018-07-18 DIAGNOSIS — I493 Ventricular premature depolarization: Secondary | ICD-10-CM | POA: Diagnosis present

## 2018-07-18 DIAGNOSIS — I10 Essential (primary) hypertension: Secondary | ICD-10-CM | POA: Diagnosis not present

## 2018-07-18 DIAGNOSIS — Z955 Presence of coronary angioplasty implant and graft: Secondary | ICD-10-CM | POA: Diagnosis not present

## 2018-07-18 DIAGNOSIS — Z7902 Long term (current) use of antithrombotics/antiplatelets: Secondary | ICD-10-CM | POA: Diagnosis not present

## 2018-07-18 DIAGNOSIS — Y831 Surgical operation with implant of artificial internal device as the cause of abnormal reaction of the patient, or of later complication, without mention of misadventure at the time of the procedure: Secondary | ICD-10-CM | POA: Diagnosis present

## 2018-07-18 DIAGNOSIS — E7800 Pure hypercholesterolemia, unspecified: Secondary | ICD-10-CM | POA: Diagnosis present

## 2018-07-18 DIAGNOSIS — Z951 Presence of aortocoronary bypass graft: Secondary | ICD-10-CM | POA: Diagnosis not present

## 2018-07-18 DIAGNOSIS — Z8249 Family history of ischemic heart disease and other diseases of the circulatory system: Secondary | ICD-10-CM | POA: Diagnosis not present

## 2018-07-18 HISTORY — PX: CORONARY ATHERECTOMY: CATH118238

## 2018-07-18 HISTORY — PX: CORONARY ANGIOGRAPHY: CATH118303

## 2018-07-18 LAB — CBC
HEMATOCRIT: 43.6 % (ref 39.0–52.0)
Hemoglobin: 14.6 g/dL (ref 13.0–17.0)
MCH: 31.4 pg (ref 26.0–34.0)
MCHC: 33.5 g/dL (ref 30.0–36.0)
MCV: 93.8 fL (ref 78.0–100.0)
Platelets: 275 10*3/uL (ref 150–400)
RBC: 4.65 MIL/uL (ref 4.22–5.81)
RDW: 13.4 % (ref 11.5–15.5)
WBC: 9 10*3/uL (ref 4.0–10.5)

## 2018-07-18 LAB — BASIC METABOLIC PANEL
Anion gap: 7 (ref 5–15)
BUN: 20 mg/dL (ref 8–23)
CO2: 24 mmol/L (ref 22–32)
CREATININE: 1.09 mg/dL (ref 0.61–1.24)
Calcium: 8.8 mg/dL — ABNORMAL LOW (ref 8.9–10.3)
Chloride: 109 mmol/L (ref 98–111)
GFR calc Af Amer: >60 mL/min (ref >60)
GLUCOSE: 95 mg/dL (ref 70–99)
Potassium: 3.9 mmol/L (ref 3.5–5.1)
SODIUM: 140 mmol/L (ref 135–145)

## 2018-07-18 LAB — POCT ACTIVATED CLOTTING TIME
ACTIVATED CLOTTING TIME: 147 s
ACTIVATED CLOTTING TIME: 180 s
Activated Clotting Time: 285 seconds
Activated Clotting Time: 296 seconds
Activated Clotting Time: 246 seconds
Activated Clotting Time: 483 seconds

## 2018-07-18 LAB — LIPID PANEL
CHOL/HDL RATIO: 3.4 ratio
Cholesterol: 108 mg/dL (ref 0–200)
HDL: 32 mg/dL — ABNORMAL LOW (ref >40)
LDL CALC: 59 mg/dL (ref 0–99)
Triglycerides: 87 mg/dL (ref <150)
VLDL: 17 mg/dL (ref 0–40)

## 2018-07-18 LAB — PROTIME-INR
INR: 1.22
Prothrombin Time: 15.3 seconds — ABNORMAL HIGH (ref 11.4–15.2)

## 2018-07-18 LAB — TROPONIN I
TROPONIN I: 0.03 ng/mL — AB (ref <0.03)
Troponin I: 0.04 ng/mL (ref <0.03)

## 2018-07-18 LAB — HEPARIN LEVEL (UNFRACTIONATED)
HEPARIN UNFRACTIONATED: 0.56 [IU]/mL (ref 0.30–0.70)
Heparin Unfractionated: 0.65 IU/mL (ref 0.30–0.70)

## 2018-07-18 SURGERY — CORONARY ATHERECTOMY
Anesthesia: LOCAL

## 2018-07-18 MED ORDER — FENTANYL CITRATE (PF) 100 MCG/2ML IJ SOLN
INTRAMUSCULAR | Status: AC
  Filled 2018-07-18: qty 2

## 2018-07-18 MED ORDER — HEPARIN SODIUM (PORCINE) 1000 UNIT/ML IJ SOLN
INTRAMUSCULAR | Status: DC | PRN
  Administered 2018-07-18: 8000 [IU] via INTRAVENOUS
  Administered 2018-07-18: 4000 [IU] via INTRAVENOUS

## 2018-07-18 MED ORDER — LIDOCAINE HCL (PF) 1 % IJ SOLN
INTRAMUSCULAR | Status: AC
  Filled 2018-07-18: qty 30

## 2018-07-18 MED ORDER — HYDRALAZINE HCL 20 MG/ML IJ SOLN: 5.0000 mg | INTRAMUSCULAR | Status: AC | PRN

## 2018-07-18 MED ORDER — VIPERSLIDE LUBRICANT OPTIME
TOPICAL | Status: DC | PRN
  Administered 2018-07-18: 16:00:00 via SURGICAL_CAVITY

## 2018-07-18 MED ORDER — SODIUM CHLORIDE 0.9 % IV SOLN: 250.0000 mL | INTRAVENOUS | Status: DC | PRN

## 2018-07-18 MED ORDER — NITROGLYCERIN 0.2 MG/ML ON CALL CATH LAB
INTRAVENOUS | Status: AC
  Filled 2018-07-18: qty 1

## 2018-07-18 MED ORDER — HEPARIN (PORCINE) IN NACL 1000-0.9 UT/500ML-% IV SOLN
INTRAVENOUS | Status: AC
  Filled 2018-07-18: qty 1000

## 2018-07-18 MED ORDER — SODIUM CHLORIDE 0.9 % IV SOLN: INTRAVENOUS | Status: AC

## 2018-07-18 MED ORDER — IOPAMIDOL (ISOVUE-370) INJECTION 76%
INTRAVENOUS | Status: DC | PRN
  Administered 2018-07-18: 115 mL via INTRA_ARTERIAL

## 2018-07-18 MED ORDER — LABETALOL HCL 5 MG/ML IV SOLN: 10.0000 mg | INTRAVENOUS | Status: AC | PRN

## 2018-07-18 MED ORDER — SODIUM CHLORIDE 0.9% FLUSH: 3.0000 mL | INTRAVENOUS | Status: DC | PRN

## 2018-07-18 MED ORDER — HEPARIN SODIUM (PORCINE) 1000 UNIT/ML IJ SOLN
INTRAMUSCULAR | Status: AC
  Filled 2018-07-18: qty 1

## 2018-07-18 MED ORDER — MIDAZOLAM HCL 2 MG/2ML IJ SOLN
INTRAMUSCULAR | Status: DC | PRN
  Administered 2018-07-18 (×5): 1 mg via INTRAVENOUS

## 2018-07-18 MED ORDER — NITROGLYCERIN 1 MG/10 ML FOR IR/CATH LAB
INTRA_ARTERIAL | Status: DC | PRN
  Administered 2018-07-18 (×3): 200 ug via INTRACORONARY

## 2018-07-18 MED ORDER — LIDOCAINE HCL (PF) 1 % IJ SOLN
INTRAMUSCULAR | Status: DC | PRN
  Administered 2018-07-18: 18 mL via INTRADERMAL
  Administered 2018-07-18: 5 mL via INTRADERMAL

## 2018-07-18 MED ORDER — HEPARIN (PORCINE) IN NACL 1000-0.9 UT/500ML-% IV SOLN
INTRAVENOUS | Status: DC | PRN
  Administered 2018-07-18 (×3): 500 mL

## 2018-07-18 MED ORDER — FENTANYL CITRATE (PF) 100 MCG/2ML IJ SOLN
INTRAMUSCULAR | Status: DC | PRN
  Administered 2018-07-18 (×5): 25 ug via INTRAVENOUS

## 2018-07-18 MED ORDER — ANGIOPLASTY BOOK
Freq: Once | Status: DC
  Filled 2018-07-18: qty 1

## 2018-07-18 MED ORDER — MIDAZOLAM HCL 2 MG/2ML IJ SOLN
INTRAMUSCULAR | Status: AC
  Filled 2018-07-18: qty 2

## 2018-07-18 MED ORDER — SODIUM CHLORIDE 0.9% FLUSH
3.0000 mL | Freq: Two times a day (BID) | INTRAVENOUS | Status: DC
  Administered 2018-07-19: 3 mL via INTRAVENOUS

## 2018-07-18 MED ORDER — SODIUM CHLORIDE 0.9 % IV SOLN
INTRAVENOUS | Status: DC | PRN
  Administered 2018-07-18: 20 mL/h via INTRAVENOUS

## 2018-07-18 MED ORDER — IOHEXOL 350 MG/ML SOLN
INTRAVENOUS | Status: DC | PRN
  Administered 2018-07-18: 5 mL via INTRA_ARTERIAL

## 2018-07-18 SURGICAL SUPPLY — 29 items
BALLN SAPPHIRE 2.5X15 (BALLOONS) ×2
BALLN SAPPHIRE ~~LOC~~ 3.25X12 (BALLOONS) ×2 IMPLANT
BALLOON SAPPHIRE 2.5X15 (BALLOONS) ×1 IMPLANT
CATH INFINITI JR4 5F (CATHETERS) ×2 IMPLANT
CATH TELEPORT (CATHETERS) ×2 IMPLANT
CATH TRAPPER 6-8F (CATHETERS) ×2 IMPLANT
CATH VISTA GUIDE 6FR XBLAD3.5 (CATHETERS) ×2 IMPLANT
CROWN DIAMONDBACK CLASSIC 1.25 (BURR) ×2 IMPLANT
DEVICE CLOSURE PERCLS PRGLD 6F (VASCULAR PRODUCTS) ×2 IMPLANT
ELECT DEFIB PAD ADLT CADENCE (PAD) ×2 IMPLANT
KIT ENCORE 26 ADVANTAGE (KITS) ×2 IMPLANT
KIT HEART LEFT (KITS) ×2 IMPLANT
KIT HEMO VALVE WATCHDOG (MISCELLANEOUS) ×2 IMPLANT
LUBRICANT VIPERSLIDE CORONARY (MISCELLANEOUS) ×2 IMPLANT
PACK CARDIAC CATHETERIZATION (CUSTOM PROCEDURE TRAY) ×2 IMPLANT
PERCLOSE PROGLIDE 6F (VASCULAR PRODUCTS) ×4
SHEATH PINNACLE 6F 10CM (SHEATH) ×2 IMPLANT
SHEATH PINNACLE 7F 10CM (SHEATH) ×2 IMPLANT
SHEATH PROBE COVER 6X72 (BAG) ×2 IMPLANT
SHIELD RADPAD SCOOP 12X17 (MISCELLANEOUS) ×2 IMPLANT
STENT SYNERGY DES 2.5X12 (Permanent Stent) ×2 IMPLANT
STENT SYNERGY DES 2.75X20 (Permanent Stent) ×2 IMPLANT
TRANSDUCER W/STOPCOCK (MISCELLANEOUS) ×2 IMPLANT
TUBING CIL FLEX 10 FLL-RA (TUBING) ×2 IMPLANT
WIRE ASAHI PROWATER 300CM (WIRE) ×2 IMPLANT
WIRE EMERALD 3MM-J .035X150CM (WIRE) ×2 IMPLANT
WIRE MARVEL STR TIP 190CM (WIRE) ×2 IMPLANT
WIRE PT2 MS 185 (WIRE) ×2 IMPLANT
WIRE VIPER ADVANCE COR .012TIP (WIRE) ×2 IMPLANT

## 2018-07-18 NOTE — Progress Notes (Signed)
ANTICOAGULATION CONSULT NOTE   Pharmacy Consult for Heparin Indication: chest pain/ACS  No Known Allergies  Patient Measurements: Height: 5\' 9"  (175.3 cm) Weight: 172 lb 3.2 oz (78.1 kg) IBW/kg (Calculated) : 70.7 Heparin Dosing Weight: 78 kg  Vital Signs: Temp: 98 F (36.7 C) (07/31 0854) Temp Source: Oral (07/31 0854) BP: 101/70 (07/31 0854) Pulse Rate: 85 (07/31 0854)  Labs: Recent Labs    07/17/18 1617 07/17/18 2246 07/18/18 0425 07/18/18 1139  HGB 16.0 15.0 14.6  --   HCT 48.7 44.7 43.6  --   PLT 308 278 275  --   LABPROT  --   --  15.3*  --   INR  --   --  1.22  --   HEPARINUNFRC  --   --  0.56 0.65  CREATININE 1.35* 1.17 1.09  --   TROPONINI  --  0.04* 0.04*  --     Estimated Creatinine Clearance: 56.8 mL/min (by C-G formula based on SCr of 1.09 mg/dL).   Medical History: Past Medical History:  Diagnosis Date  . Arthritis    "a little in my fingers; never tx'd" (07/17/2018)  . CAD (coronary artery disease)    a. CABG X4 2008. b. MI and stent (in [place])  . Foot fracture, right 1996  . High cholesterol   . Hypertension   . Leg hematoma 01/2018   RLE; "no tx"  . Presence of arterial stent 2016   Right subclavian artery  . Skipped heart beats   . Stenosis of subclavian artery (HCC)    a. s/p R subclavian stent 2016.   Assessment: [age] yoM with chest pain who underwent repeat cardiac cath ~ 1 week ago. Planning for PCI with atherectomy 7/31. No anticoagulation prior to admit. Pharmacy consulted to dose heparin. Heparin level remains therapeutic at 0.65 - watch trend. CBC wnl. No bleed issues documented.  Goal of Therapy:  Heparin level 0.3-0.7 units/ml Monitor platelets by anticoagulation protocol: Yes   Plan:  Continue heparin infusion at 1100 units/hr Monitor daily heparin level and CBC, s/sx bleeding F/u plans post-procedure  Babs BertinHaley Anthonee Gelin, PharmD, BCPS Clinical Pharmacist Clinical phone [phone] Please check AMION for all [facility] contact  numbers 07/18/2018 12:08 PM

## 2018-07-18 NOTE — Progress Notes (Signed)
Pt complained pf cp 8/10 on left side, 2l 02 applied via [place]. No nausea., vomiting. Nitroglycerine now running at 20 mcg/min. Bumped o2 to 4l. Troponin drawn per lab, 12 ekg done. Pt stated his chest pressure is barely there, and sob is better.

## 2018-07-18 NOTE — Progress Notes (Signed)
CRITICAL VALUE ALERT  Critical Value:  Troponin   Date & Time Notied:  07/18/18, 0025  Provider Notified: On call Cardiologist text paged with result, pt's current status with cp 1-2/10, nitro drip on 20 mcg/min, and O2 at  L/min.   Orders Received/Actions taken: waiting on call back.

## 2018-07-18 NOTE — Progress Notes (Addendum)
Progress Note  Patient Name: Robert [name] Date of Encounter: 07/18/2018  Primary Cardiologist: Tonny Bollman, MD   Subjective   Feeling well. No chest pain, sob or palpitations.   Inpatient Medications    Scheduled Meds: . amLODipine  5 mg Oral Daily  . aspirin EC  81 mg Oral QHS  . atorvastatin  80 mg Oral q1800  . clopidogrel  75 mg Oral Daily  . metoprolol succinate  50 mg Oral Daily  . sodium chloride flush  3 mL Intravenous Q12H   Continuous Infusions: . sodium chloride    . sodium chloride 50 mL/hr at 07/18/18 0419  . heparin 1,100 Units/hr (07/17/18 2045)  . nitroGLYCERIN 20 mcg/min (07/17/18 2351)   PRN Meds: sodium chloride, acetaminophen, ondansetron (ZOFRAN) IV, sodium chloride flush   Vital Signs    Vitals:   07/17/18 2350 07/18/18 0048 07/18/18 0416 07/18/18 0700  BP: 108/62 (!) 108/59 112/78   Pulse: 70 86 82 74  Resp: 17 16 17 14   Temp:  98.1 F (36.7 C) 98.2 F (36.8 C)   TempSrc:  Oral Oral   SpO2: 97% 97% 95% 94%  Weight:    172 lb 3.2 oz (78.1 kg)  Height:        Intake/Output Summary (Last 24 hours) at 07/18/2018 0813 Last data filed at 07/18/2018 0500 Gross per 24 hour  Intake 489.47 ml  Output 200 ml  Net 289.47 ml   Filed Weights   07/17/18 1605 07/17/18 2111 07/18/18 0700  Weight: 172 lb (78 kg) 175 lb (79.4 kg) 172 lb 3.2 oz (78.1 kg)    Telemetry    SR with PVCs - Personally Reviewed  ECG    SR - Personally Reviewed  Physical Exam   GEN: No acute distress.   Neck: No JVD Cardiac: RRR, no murmurs, rubs, or gallops.  Respiratory: Clear to auscultation bilaterally. GI: Soft, nontender, non-distended  MS: No edema; No deformity. Neuro:  Nonfocal  Psych: Normal affect   Labs    Chemistry Recent Labs  Lab 07/13/18 0525 07/17/18 1617 07/17/18 2246 07/18/18 0425  NA 142 139 137 140  K 4.5 4.3 3.9 3.9  CL 111 107 108 109  CO2 23 22 20* 24  GLUCOSE 109* 108* 179* 95  BUN 14 23 21 20   CREATININE 1.09 1.35*  1.17 1.09  CALCIUM 9.0 9.5 8.9 8.8*  ALBUMIN 3.1*  --   --   --   GFRNONAA >60 49* 58* >60  GFRAA >60 57* >60 >60  ANIONGAP 8 10 9 7      Hematology Recent Labs  Lab 07/17/18 1617 07/17/18 2246 07/18/18 0425  WBC 10.2 9.8 9.0  RBC 5.16 4.86 4.65  HGB 16.0 15.0 14.6  HCT 48.7 44.7 43.6  MCV 94.4 92.0 93.8  MCH 31.0 30.9 31.4  MCHC 32.9 33.6 33.5  RDW 13.5 13.4 13.4  PLT 308 278 275    Cardiac Enzymes Recent Labs  Lab 07/17/18 2246 07/18/18 0425  TROPONINI 0.04* 0.04*    Recent Labs  Lab 07/17/18 1622  TROPIPOC 0.04     BNP Recent Labs  Lab 07/17/18 1617  BNP 53.1     DDimer No results for input(s): DDIMER in the last 168 hours.   Radiology    Dg Chest 2 View  Result Date: 07/17/2018 CLINICAL DATA:  Chest pain.  Shortness of breath. EXAM: CHEST - 2 VIEW COMPARISON:  07/08/2018. FINDINGS: Prior CABG. Heart size normal. Mild bibasilar subsegmental atelectasis and/or scarring  again noted. No pleural effusion or pneumothorax. IMPRESSION: 1.  Mild bibasilar subsegmental atelectasis and/or scarring. 2.  Prior CABG.  Heart size normal. Electronically Signed   By: Maisie Fushomas  Register   On: 07/17/2018 17:21    Cardiac Studies   Pending cath today  Patient Profile     Robert [name] a 77 y.o.malewith a hx of CAD s/p CABG 2008, right subclavian stent 2016 (on Plavix), HTN, HLDwho admitted with unstable angina.  Catheterization 07/10/18 revealed significant native CAD with 90% distal left main stenosis extending into a ramus intermediate vessel, total ostial occlusion of the LAD, 90% ostial stenosis of the circumflex with total occlusion of the mid vessel, and 60% in-stent restenosis in the RCA stent with 95% distal RCA stenosis followed by 99% stenosis after the PDA takeoff.  The SVG to the RCA was occluded as was and SVG to the ramus intermediate vessel.  He had a patent LIMA graft to the LAD and a patent vein graft to obtuse marginal vessel.  Here underwent  successful staged PCI 2 days later to 3 sites in his RCA and there was an initial attempt at intervention to the distal left main into the ramus vessel but a 1.5 mm balloon was unable to cross the stenosis due to significant calcification and attempted intervention was aborted with plans for potential future atherectomy depending upon symptoms status.  Assessment & Plan    1. Unstable angina with know CAD - Plan for coronary arterectomy today. On heparin and nitro drip. No chest pain. Flat troponin. Continue ASA, Plavix, BB, norvasc and statin.   2. HLD - LDL 59. Continue statin.   For questions or updates, please contact [facility] Please consult [email or web address] for contact info under Cardiology/STEMI.      Lorelei PontSigned, Bhavinkumar Bhagat, PA  07/18/2018, 8:13 AM    Patient seen and examined. Agree with assessment and plan. Had recurrent chest pain last night, better today. Trop 0.04.  I have personally reviewed the prior angios this am with Robert [name] and Robert [name].  Will need CSI atherectomy to distal LM into Ramus.  CSI rep has been notified.  Plan this afternoon around 3 pm. Pt aware of risks/benefits.    Lennette Biharihomas A. Kelly, MD, [facility] HospitalFACC 07/18/2018 2:29 PM

## 2018-07-18 NOTE — Progress Notes (Signed)
Site area: left groin  Site Prior to Removal:  Level 0  Pressure Applied For 18 MINUTES    Minutes Beginning at 20:45  Manual:   Yes.    Patient Status During Pull:  NWL  Post Pull Groin Site:  Level 0  Post Pull Instructions Given:  Yes.    Post Pull Pulses Present:  Yes.    Dressing Applied:  Yes.    Comments:  Pt tolerated well.

## 2018-07-18 NOTE — Progress Notes (Signed)
ANTICOAGULATION CONSULT NOTE   Pharmacy Consult for Heparin Indication: chest pain/ACS  No Known Allergies  Patient Measurements: Height: 5\' 9"  (175.3 cm) Weight: 175 lb (79.4 kg) IBW/kg (Calculated) : 70.7 Heparin Dosing Weight: 78 kg  Vital Signs: Temp: 98.2 F (36.8 C) (07/31 0416) Temp Source: Oral (07/31 0416) BP: 112/78 (07/31 0416) Pulse Rate: 82 (07/31 0416)  Labs: Recent Labs    07/17/18 1617 07/17/18 2246 07/18/18 0425  HGB 16.0 15.0  --   HCT 48.7 44.7  --   PLT 308 278  --   LABPROT  --   --  15.3*  INR  --   --  1.22  HEPARINUNFRC  --   --  0.56  CREATININE 1.35* 1.17 1.09  TROPONINI  --  0.04* 0.04*    Estimated Creatinine Clearance: 56.8 mL/min (by C-G formula based on SCr of 1.09 mg/dL).   Medical History: Past Medical History:  Diagnosis Date  . Arthritis    "a little in my fingers; never tx'd" (07/17/2018)  . CAD (coronary artery disease)    a. CABG X4 2008. b. [place]  . Foot fracture, right 1996  . High cholesterol   . Hypertension   . Leg hematoma 01/2018   RLE; "no tx"  . Presence of arterial stent 2016   Right subclavian artery  . Skipped heart beats   . Stenosis of subclavian artery (HCC)    a. s/p R subclavian stent 2016.   Assessment: [age] yoM with chest pain who underwent repeat cardiac cath ~ 1 week ago. Patient will likely go for PCI with atherectomy in the morning. No anticoagulation prior to admit. Pharmacy consulted to dose heparin. During last admit patient was on heparin infusion at 1300 units/hr. Will give bolus and initiate infusion at 14 units/kg then increase if needed.   7/31 AM update: initial heparin level therapeutic   Goal of Therapy:  Heparin level 0.3-0.7 units/ml Monitor platelets by anticoagulation protocol: Yes   Plan:  Cont heparin infusion at 1100 units/hr Check anti-Xa level in 8 hours and daily while on heparin Continue to monitor H&H and platelets  Abran DukeJames Rheanna Sergent, PharmD, BCPS Clinical  Pharmacist Phone: [phone]

## 2018-07-19 ENCOUNTER — Encounter (HOSPITAL_COMMUNITY): Payer: Self-pay | Admitting: Cardiology

## 2018-07-19 DIAGNOSIS — I10 Essential (primary) hypertension: Secondary | ICD-10-CM

## 2018-07-19 LAB — BASIC METABOLIC PANEL
Anion gap: 6 (ref 5–15)
BUN: 12 mg/dL (ref 8–23)
CHLORIDE: 111 mmol/L (ref 98–111)
CO2: 23 mmol/L (ref 22–32)
CREATININE: 0.98 mg/dL (ref 0.61–1.24)
Calcium: 8.9 mg/dL (ref 8.9–10.3)
GFR calc Af Amer: >60 mL/min (ref >60)
GFR calc non Af Amer: >60 mL/min (ref >60)
Glucose, Bld: 113 mg/dL — ABNORMAL HIGH (ref 70–99)
Potassium: 4.2 mmol/L (ref 3.5–5.1)
SODIUM: 140 mmol/L (ref 135–145)

## 2018-07-19 LAB — CBC
HCT: 46.3 % (ref 39.0–52.0)
HEMOGLOBIN: 15.1 g/dL (ref 13.0–17.0)
MCH: 30.5 pg (ref 26.0–34.0)
MCHC: 32.6 g/dL (ref 30.0–36.0)
MCV: 93.5 fL (ref 78.0–100.0)
Platelets: 267 10*3/uL (ref 150–400)
RBC: 4.95 MIL/uL (ref 4.22–5.81)
RDW: 13.6 % (ref 11.5–15.5)
WBC: 9 10*3/uL (ref 4.0–10.5)

## 2018-07-19 MED ORDER — METOPROLOL SUCCINATE ER 50 MG PO TB24: 50.0000 mg | ORAL_TABLET | Freq: Every day | ORAL | Status: DC

## 2018-07-19 MED ORDER — CLOPIDOGREL BISULFATE 75 MG PO TABS: 75.0000 mg | ORAL_TABLET | Freq: Every day | ORAL | 2 refills | Status: DC

## 2018-07-19 MED ORDER — ATORVASTATIN CALCIUM 80 MG PO TABS: 80.0000 mg | ORAL_TABLET | Freq: Every day | ORAL | 1 refills | Status: DC

## 2018-07-19 NOTE — Discharge Summary (Addendum)
Discharge Summary    Patient ID: Robert [name],  MRN: [id number], DOB/AGE: Dec 26, 1940 77 y.o.  Admit date: 07/17/2018 Discharge date: 07/19/2018  Primary Care Provider: Georgann [name] Primary Cardiologist: Dr. Excell [name]    Discharge Diagnoses    Active Problems:   Chest pain  Allergies No Known Allergies  Diagnostic Studies/Procedures    Cath: 07/18/18   Mid LM to Dist LM lesion is 90% stenosed with 90% stenosed side branch in Ost Ramus.  A drug-eluting stent was successfully placed using a STENT SYNERGY DES 2.75X20. - post-dilated to 3.3 mm  A drug-eluting stent was successfully placed using a STENT SYNERGY DES 2.5X12 to cover a distal edge dissection. Post-dilated from 3.0-2.6 mm.  Post intervention, there is a 0% residual stenosis in the LEFT MAIN & the Ramus was reduced to 0% residual stenosis.  _________________________________________________________________________________________  Robert [name] LAD to Prox LAD lesion is 100% stenosed.  LIMA graft was not injected -known to be patent by recent cath  SVG-RCA graft was not visualized due to known occlusion.  SVG-Ramus graft was not visualized due to known occlusion.  Ost Cx lesion is 50% stenosed. --This lesion actually up to be improved from 90% pre-atherectomy  Prox Cx to Mid Cx lesion is 100% stenosed.  SVG-dCx(OM2) graft was not visualized. Known to be open by recent cath.  Prox RCA to Mid RCA previously placed DES stents are 10-20% stenosed at site of recent PTCA.  The recently placed distal RCA Stent from 07/12/2018 is widely patent.  Post Atrio-1 lesion is 20% stenosed. Post Atrio-2 lesion is 10% stenosed. Both lesions appear to be notably improved post PTCA. The ostial PDA is also patent.   Successful orbital atherectomy based PTCA-PCI of the distal Left Main and ostial Ramus Intermedius extending into the proximal Ramus using 2 overlapping DES stents-Synergy 2.75 mm x 20 mm in the original lesion and a 2.5 mm x  12 mm covering the distal edge tear.  The patient will be transferred to 6 Central post procedure unit for sheath removal with manual pressure held.  Plan: Anticipate discharge tomorrow. Continue risk factor modification.  Recommend dual antiplatelet therapy with Aspirin 81mg  daily and Clopidogrel 75mg  daily long-term (beyond 12 months) because of Extensive RCA stents and now LEFT MAIN-RAMUS INTERMEDIUS STENTS.   The patient has actually never seen Dr. Excell [name].  It would not be unreasonable for him to follow-up with either myself or Dr. Clifton [name] since we have both seen him in the hospital.  Robert [name], M.D., M.S. _____________   History of Present Illness     Robert [name] is 77 year old male who was seen in the office initially in June 2019 to establish care. He recently moved to [facility] from [place] in September 2018. Per chart review, he underwent CABG x4 after being evaluated due to his brother needing bypass surgery.   He denied ever having any symptoms or heart attack. Since then he has had an MI and stent several years later, details unclear and records unavailable.  The patient did not remember having specific anginal symptoms before.  Apparently, most recently his prior cardiologist wanted to do a stress test since his CABG was approximately 10 years ago but the patient deferred and wanted to wait until he moved.  He was asymptomatic at the time of that visit 05/25/2018.  He underwent a nuclear stress test 06/06/2018 which showed large size, moderate intensity fixed inferior septal and inferior lateral perfusion defects, worse at rest than stress, suspicious for  artifact, no reversible ischemia, EF 70% with normal wall motion, low risk study.  He exercised approximately 4 minutes and had PACs, PVCs in recovery.  Of note, he had a history of spontaneous calf hematoma in 01/2018 to the plan was that if the stress test was normal, it would be okay to stop his Plavix.  After this office visit, he  had intermittent chest pain for approximately 1 month which had atypical and typical features.  He had a nonischemic nuc recently, but this was while asymptomatic. His exercise tolerance with severely reduced at the time despite reports of regular exercise.  This was his second presentation in a week to the emergency department for chest pressure/pain. Given his symptoms, plan was to confirm with definitive cath for 07/10/2018.   Catheterization revealed significant native CAD with 90% distal left main stenosis extending into a ramus intermediate vessel, total ostial occlusion of the LAD, 90% ostial stenosis of the circumflex with total occlusion of the mid vessel, and 60% in-stent restenosis in the RCA stent with 95% distal RCA stenosis followed by 99% stenosis after the PDA takeoff.  The SVG to the RCA was occluded as was and SVG to the ramus intermediate vessel.  He had a patent LIMA graft to the LAD and a patent vein graft to obtuse marginal vessel.  Here underwent successful staged PCI 2 days later to 3 sites in his RCA and there was an initial attempt at intervention to the distal left main into the ramus vessel but a 1.5 mm balloon was unable to cross the stenosis due to significant calcification and attempted intervention was aborted with plans for potential future atherectomy depending upon symptoms status.  He was discharged on a medical regimen consisting of aspirin and Plavix, amlodipine 10mg , ramipril 10 mg, Toprol 50 mg.  Since discharge, he began to notice some recurrent chest pain symptomatology over the weekend.  The day of admission his chest pain became more intense and was 8 out of 10 in character and lasted for several hours with ultimate resolution.  He presented to the emergency room for evaluation. Given his symptoms and known disease he was started on IV heparin and nitro with plans to take back for cardiac cath.  Hospital Course     Troponin cycled with low flat trend of 0.04. He  was taken back to that cath lab with Dr. Herbie BaltimoreHarding and underwent successful orbital atherectomy based PTCA-PCI of the distal Left Main and ostial Ramus Intermedius extending into the proximal Ramus using 2 overlapping DES stents-Synergy 2.75 mm x 20 mm in the original lesion and a 2.5 mm x 12 mm covering the distal edge tear. Plan will be to continue on DAPT with ASA/plavix preferably longer than 12 months given his extensive stenting in the RCA and now LM stenting. He did well overnight without recurrent chest pain. Morning labs were stable. His atorvastatin was increased from 40mg  to 80mg  daily. He was continued on Toprol, amlodipine and Ramipril. Worked well with cardiac rehab.   General: Well developed, well nourished, male appearing in no acute distress. Head: Normocephalic, atraumatic.  Neck: Supple, no JVD. Lungs:  Resp regular and unlabored, CTA. Heart: RRR, S1, S2, soft systolic murmur; no rub. Abdomen: Soft, non-tender, non-distended with normoactive bowel sounds. No hepatomegaly. No rebound/guarding. No obvious abdominal masses. Extremities: No clubbing, cyanosis, edema. Distal pedal pulses are 2+ bilaterally. L femoral cath site stable with mild bruising but no hematoma Neuro: Alert and oriented X 3. Moves all extremities  spontaneously. Psych: Normal affect.  Dee Jaroszewski was seen by Dr. Tresa Endo and determined stable for discharge home. Follow up in the office has been arranged. Medications are listed below.   _____________  Discharge Vitals Blood pressure (!) 142/61, pulse 80, temperature 98.4 F (36.9 C), temperature source Oral, resp. rate (!) 22, height 5\' 9"  (1.753 m), weight 174 lb 9.7 oz (79.2 kg), SpO2 95 %.  Filed Weights   07/17/18 2111 07/18/18 0700 07/19/18 0616  Weight: 175 lb (79.4 kg) 172 lb 3.2 oz (78.1 kg) 174 lb 9.7 oz (79.2 kg)    Labs & Radiologic Studies    CBC Recent Labs    07/17/18 2246 07/18/18 0425 07/19/18 0236  WBC 9.8 9.0 9.0  NEUTROABS 5.8  --    --   HGB 15.0 14.6 15.1  HCT 44.7 43.6 46.3  MCV 92.0 93.8 93.5  PLT 278 275 267   Basic Metabolic Panel Recent Labs    16/10/96 0425 07/19/18 0236  NA 140 140  K 3.9 4.2  CL 109 111  CO2 24 23  GLUCOSE 95 113*  BUN 20 12  CREATININE 1.09 0.98  CALCIUM 8.8* 8.9   Liver Function Tests No results for input(s): AST, ALT, ALKPHOS, BILITOT, PROT, ALBUMIN in the last 72 hours. No results for input(s): LIPASE, AMYLASE in the last 72 hours. Cardiac Enzymes Recent Labs    07/17/18 2246 07/18/18 0425 07/18/18 1139  TROPONINI 0.04* 0.04* 0.03*   BNP Invalid input(s): POCBNP D-Dimer No results for input(s): DDIMER in the last 72 hours. Hemoglobin A1C Recent Labs    07/17/18 2246  HGBA1C 5.8*   Fasting Lipid Panel Recent Labs    07/18/18 0425  CHOL 108  HDL 32*  LDLCALC 59  TRIG 87  CHOLHDL 3.4   Thyroid Function Tests No results for input(s): TSH, T4TOTAL, T3FREE, THYROIDAB in the last 72 hours.  Invalid input(s): FREET3 _____________  Dg Chest 2 View  Result Date: 07/17/2018 CLINICAL DATA:  Chest pain.  Shortness of breath. EXAM: CHEST - 2 VIEW COMPARISON:  07/08/2018. FINDINGS: Prior CABG. Heart size normal. Mild bibasilar subsegmental atelectasis and/or scarring again noted. No pleural effusion or pneumothorax. IMPRESSION: 1.  Mild bibasilar subsegmental atelectasis and/or scarring. 2.  Prior CABG.  Heart size normal. Electronically Signed   By: Maisie Fus  Register   On: 07/17/2018 17:21   Dg Chest 2 View  Result Date: 07/08/2018 CLINICAL DATA:  Chest pain and shortness of breath EXAM: CHEST - 2 VIEW COMPARISON:  July 05, 2018 FINDINGS: There is no appreciable edema or consolidation. Heart size and pulmonary vascularity are normal. No adenopathy. Patient is status post internal mammary bypass grafting. There is aortic atherosclerosis. No evident bone lesions. IMPRESSION: Postoperative changes. Aortic atherosclerosis. No edema or consolidation. Aortic Atherosclerosis  (ICD10-I70.0). Electronically Signed   By: Bretta Bang III M.D.   On: 07/08/2018 21:35   Dg Chest 2 View  Result Date: 07/05/2018 CLINICAL DATA:  Chest pain EXAM: CHEST - 2 VIEW COMPARISON:  None. FINDINGS: Lungs are clear. Heart size and pulmonary vascularity are normal. No adenopathy. Patient is status post internal mammary bypass grafting. No pneumothorax. No evident bone lesions. IMPRESSION: Status post internal mammary bypass grafting. No edema or consolidation. Electronically Signed   By: Bretta Bang III M.D.   On: 07/05/2018 10:23   Disposition   Pt is being discharged home today in good condition.  Follow-up Plans & Appointments    Follow-up Information    Rosalio Macadamia,  NP Follow up on 07/23/2018.   Specialties:  Nurse Practitioner, Interventional Cardiology, Cardiology, Radiology Why:  at 11:30am for your follow up appt.  Contact information: [address] [place] [address] [phone]          Discharge Instructions    AMB Referral to Cardiac Rehabilitation - Phase II   Complete by:  As directed    Diagnosis:   Coronary Stents Stable Angina     Call MD for:  redness, tenderness, or signs of infection (pain, swelling, redness, odor or green/yellow discharge around incision site)   Complete by:  As directed    Diet - low sodium heart healthy   Complete by:  As directed    Discharge instructions   Complete by:  As directed    Groin Site Care Refer to this sheet in the next few weeks. These instructions provide you with information on caring for yourself after your procedure. Your caregiver may also give you more specific instructions. Your treatment has been planned according to current medical practices, but problems sometimes occur. Call your caregiver if you have any problems or questions after your procedure. HOME CARE INSTRUCTIONS You may shower 24 hours after the procedure. Remove the bandage (dressing) and gently wash the site  with plain soap and water. Gently pat the site dry.  Do not apply powder or lotion to the site.  Do not sit in a bathtub, swimming pool, or whirlpool for 5 to 7 days.  No bending, squatting, or lifting anything over 10 pounds (4.5 kg) as directed by your caregiver.  Inspect the site at least twice daily.  Do not drive home if you are discharged the same day of the procedure. Have someone else drive you.  You may drive 24 hours after the procedure unless otherwise instructed by your caregiver.  What to expect: Any bruising will usually fade within 1 to 2 weeks.  Blood that collects in the tissue (hematoma) may be painful to the touch. It should usually decrease in size and tenderness within 1 to 2 weeks.  SEEK IMMEDIATE MEDICAL CARE IF: You have unusual pain at the groin site or down the affected leg.  You have redness, warmth, swelling, or pain at the groin site.  You have drainage (other than a small amount of blood on the dressing).  You have chills.  You have a fever or persistent symptoms for more than 72 hours.  You have a fever and your symptoms suddenly get worse.  Your leg becomes pale, cool, tingly, or numb.  You have heavy bleeding from the site. Hold pressure on the site. Marland Kitchen  PLEASE DO NOT MISS ANY DOSES OF YOUR PLAVIX!!!!! Also keep a log of you blood pressures and bring back to your follow up appt. Please call the office with any questions.   Patients taking blood thinners should generally stay away from medicines like ibuprofen, Advil, Motrin, naproxen, and Aleve due to risk of stomach bleeding. You may take Tylenol as directed or talk to your primary doctor about alternatives.   Increase activity slowly   Complete by:  As directed      Discharge Medications     Medication List    TAKE these medications   amLODipine 10 MG tablet Commonly known as:  NORVASC Take 10 mg by mouth daily. What changed:  Another medication with the same name was removed. Continue taking  this medication, and follow the directions you see here.   aspirin 81  MG EC tablet Take 81 mg by mouth at bedtime. Swallow whole.   atorvastatin 80 MG tablet Commonly known as:  LIPITOR Take 1 tablet (80 mg total) by mouth daily at 6 PM. What changed:    medication strength  how much to take  when to take this   clopidogrel 75 MG tablet Commonly known as:  PLAVIX Take 1 tablet (75 mg total) by mouth daily.   metoprolol succinate 50 MG 24 hr tablet Commonly known as:  TOPROL-XL Take 1 tablet (50 mg total) by mouth daily. Take with or immediately following a meal. What changed:  when to take this   nitroGLYCERIN 0.4 MG SL tablet Commonly known as:  NITROSTAT Place 0.4 mg under the tongue every 5 (five) minutes as needed for chest pain.   ramipril 10 MG capsule Commonly known as:  ALTACE Take 10 mg by mouth daily. What changed:  Another medication with the same name was removed. Continue taking this medication, and follow the directions you see here.        Acute coronary syndrome (MI, NSTEMI, STEMI, etc) this admission?: No.     Outstanding Labs/Studies   FLP/LFTs in 6 weeks if tolerating statin increase.  Duration of Discharge Encounter   Greater than 30 minutes including physician time.  Signed, Laverda Page NP-C 07/19/2018, 8:35 AM   Patient seen and examined. Agree with assessment and plan. Feels better; no recurrent chest pain. I have personally reviewed the angiogram from PCI yesterday. Excellent result in LM/Ramus and RCA PCI sites are stable.  Ramipril was held for the procedure; will resume.  Would contine ASA/Plavix indefinitely.  DC today.   Lennette Bihari, MD, [facility] 07/19/2018 10:24 AM

## 2018-07-19 NOTE — Progress Notes (Signed)
CARDIAC REHAB PHASE I   PRE:  Rate/Rhythm: 94 SR with bigeminy    BP: sitting 142/61    SaO2:   MODE:  Ambulation: 450 ft   POST:  Rate/Rhythm: 111 ST with less PVCs    BP: sitting 155/76     SaO2:   Pt slightly unsteady due to not having shoes and "hard floor". Less PVCs walking (had been bigeminy in bed). Ed reviewed. Has good understanding. Knows importance of Plavix/ASA. We referred him to [place] [facility] last week. Will update. [phone]   Harriet MassonRandi Kristan Rilley Stash CES, ACSM 07/19/2018 8:46 AM

## 2018-07-20 ENCOUNTER — Telehealth: Payer: Self-pay | Admitting: Nurse Practitioner

## 2018-07-20 NOTE — Telephone Encounter (Signed)
New Message:       Pt c/o medication issue:  1. Name of Medication: metoprolol succinate (TOPROL-XL) 50 MG 24 hr tablet  2. How are you currently taking this medication (dosage and times per day)? Take 1 tablet (50 mg total) by mouth daily. Take with or immediately following a meal.  3. Are you having a reaction (difficulty breathing--STAT)? No  4. What is your medication issue? Pt states he believe this medication is lowering his pulse rate since the dosage has been changed

## 2018-07-20 NOTE — Telephone Encounter (Signed)
Called patient back about his message. Patient is concerned because sometimes his HR drops to 47. Patient stated he is usually sitting down and resting when his HR drops. Patient stated he will get up and his HR will go back up. Patient is feeling asymptomatic. Informed patient that his body will take a few weeks to get use to the lower dose. Patient stated he has an appointment on Monday with Norma FredricksonLori Gerhardt NP. Informed patient that our office will check his BP and HR at visit and Lawson FiscalLori will see if his medications need adjusting. Patient will call if he has any other concerns and will keep his appointment on Monday.

## 2018-07-23 ENCOUNTER — Ambulatory Visit (INDEPENDENT_AMBULATORY_CARE_PROVIDER_SITE_OTHER): Payer: Medicare Other | Admitting: Nurse Practitioner

## 2018-07-23 ENCOUNTER — Encounter: Payer: Self-pay | Admitting: Nurse Practitioner

## 2018-07-23 VITALS — BP 138/72 | HR 80 | Ht 69.0 in | Wt 174.8 lb

## 2018-07-23 DIAGNOSIS — Z955 Presence of coronary angioplasty implant and graft: Secondary | ICD-10-CM

## 2018-07-23 DIAGNOSIS — I2510 Atherosclerotic heart disease of native coronary artery without angina pectoris: Secondary | ICD-10-CM

## 2018-07-23 DIAGNOSIS — I200 Unstable angina: Secondary | ICD-10-CM | POA: Diagnosis not present

## 2018-07-23 DIAGNOSIS — R079 Chest pain, unspecified: Secondary | ICD-10-CM

## 2018-07-23 MED ORDER — ISOSORBIDE MONONITRATE ER 30 MG PO TB24: 30.0000 mg | ORAL_TABLET | Freq: Every day | ORAL | 6 refills | Status: DC

## 2018-07-23 NOTE — Progress Notes (Signed)
CARDIOLOGY OFFICE NOTE  Date:  07/23/2018    [name] Date of Birth: June 23, 1941 Medical Record #[id number]  PCP:  Georgann Housekeeper, MD  Cardiologist:  Excell Seltzer (Has never formerly seen)  Chief Complaint  Patient presents with  . Coronary Artery Disease  . Chest Pain    Post hospital visit - seen for Dr. Excell Seltzer    History of Present Illness: Robert [name] is a 77 y.o. male who presents today for a post hospital visit. Seen for D.r Excell Seltzer.   He has never formerly seen Dr. Excell Seltzer - has seen Dr. Clifton James and Dr. Herbie Baltimore primarily during recent hospitalization and would like to see Dr. Clifton James going forward if possible.   He has a history significant for hypertension, hyperlipidemia, CAD status post CABG 2008 in [place], right subclavian stent-on Plavix (2016) and a prior right calf hematoma 01/2018. He has moved here from [place] in September 2018.   Seen in June of 2019 by Berton Bon, NP as a new patient with Dr. Excell Seltzer to get established here in [place]. However, Dr. Herbie Baltimore noted "that it would not be unreasonable for Dr. Herbie Baltimore or Dr. Clifton James to follow going forward since they both provided his hospital care".   He had remote CABG X 4 in 2008 after being evaluated due to his brother needing bypass surgery. He had never had symptoms or a heart attack prior. Since then he has had an MI and stent several years later.   At his last visit here in June - he was doing well with no symptoms. Stress testing was arranged as part of his followup with the age of his grafts.   He then underwent a nuclear stress test 06/06/2018 which showed large size, moderate intensity fixed inferior septal and inferior lateral perfusion defects, worse at rest than stress, suspicious for artifact, no reversible ischemia, EF 70% with normal wall motion, low risk study. He exercised approximately 4 minutes and had PACs, PVCs in recovery. Of note, he had a history of spontaneous calf hematoma in 01/2018 to the  plan was that if the stress test was normal, it would be okay to stop his Plavix.  After this office visit, he begain having intermittent chest pain for approximately 1 monthwhich had atypical and typical features. The nonischemic nuclear study was while he had been asymptomatic but noted to have poor exercise tolerance despite reports of regular exercise. He had been to the ER twice for chest pain/pressure.  Given his symptoms, plan was to confirm with definitive cath for 07/10/2018.   Catheterizationrevealed significant native CAD with 90% distal left main stenosis extending into a ramus intermediate vessel, total ostial occlusion of the LAD, 90% ostial stenosis of the circumflex with total occlusion of the mid vessel, and 60% in-stent restenosis in the RCA stent with 95% distal RCA stenosis followed by 99% stenosis after the PDA takeoff. The SVG to the RCA was occluded as was and SVG to the ramus intermediate vessel. He had a patent LIMA graft to the LAD and a patent vein graft to obtuse marginal vessel. Here underwent successful staged PCI 2 days later to 3 sites in his RCA and there was an initial attempt at intervention to the distal left main into the ramus vessel but a 1.5 mm balloon was unable to cross the stenosis due to significant calcification and attempted intervention was aborted with plans for potential future atherectomy depending upon symptom status.  He was discharged on DAPT along with CCB, BB and  ACE. He had return of the same chest pain over that following weekend and was readmitted. He was started on IV heparin and nitro with plans to take back for cardiac cath/athrectomy - see below.  He comes in today. He is here with his wife. He notes that he is basically "no better". His chest pain continues - off and on - with and without exertion. Feels like pressure. He has had some shortness of breath as well. Says he does not understand why he felt so much better while he was in the  hospital (was on IV NTG). Concern for bradycardia. Tells me his dose of beta blocker already cut back. He has used a couple of NTG sl since being home just a few days. He has not been home a whole week yet. No problems with this multiple cath sites. He is quite frustrated with his situation.   Past Medical History:  Diagnosis Date  . Arthritis    "a little in my fingers; never tx'd" (07/17/2018)  . CAD (coronary artery disease)    a. CABG X4 2008. b. MI and stent (in [place]) b. PCI/DES to RCA (07/12/18) c. PCI/DES to LM-ramus (07/17/18)  . Foot fracture, right 1996  . High cholesterol   . Hypertension   . Leg hematoma 01/2018   RLE; "no tx"  . Presence of arterial stent 2016   Right subclavian artery  . Skipped heart beats   . Stenosis of subclavian artery (HCC)    a. s/p R subclavian stent 2016.    Past Surgical History:  Procedure Laterality Date  . AORTIC ARCH ANGIOGRAPHY N/A 07/10/2018   Procedure: AORTIC ARCH ANGIOGRAPHY;  Surgeon: Marykay Lex, MD;  Location: [facility] INVASIVE CV LAB;  Service: Cardiovascular;  Laterality: N/A;  . CATARACT EXTRACTION W/ INTRAOCULAR LENS  IMPLANT, BILATERAL Bilateral   . CORONARY ANGIOGRAPHY N/A 07/18/2018   Procedure: CORONARY ANGIOGRAPHY;  Surgeon: Marykay Lex, MD;  Location: [facility] INVASIVE CV LAB;  Service: Cardiovascular;  Laterality: N/A;  . CORONARY ANGIOPLASTY WITH STENT PLACEMENT  2016  . CORONARY ARTERY BYPASS GRAFT  2008   "CABG X4"  . CORONARY ATHERECTOMY N/A 07/18/2018   Procedure: CORONARY ATHERECTOMY;  Surgeon: Marykay Lex, MD;  Location: [facility] INVASIVE CV LAB;  Service: Cardiovascular;  Laterality: N/A;  . CORONARY STENT INTERVENTION N/A 07/12/2018   Procedure: CORONARY STENT INTERVENTION;  Surgeon: Marykay Lex, MD;  Location: [facility] INVASIVE CV LAB;  Service: Cardiovascular;  Laterality: N/A;  . LEFT HEART CATH AND CORS/GRAFTS ANGIOGRAPHY N/A 07/10/2018   Procedure: LEFT HEART CATH AND CORS/GRAFTS ANGIOGRAPHY;  Surgeon: Marykay Lex, MD;  Location: [facility] INVASIVE CV LAB;  Service: Cardiovascular;  Laterality: N/A;  . PROSTATECTOMY  ~ 2012   "removed most of it; no cancer" (07/17/2018)  . SUBCLAVIAN ARTERY STENT Right 2016     Medications: Current Meds  Medication Sig  . amLODipine (NORVASC) 10 MG tablet Take 10 mg by mouth daily.  Marland Kitchen aspirin 81 MG EC tablet Take 81 mg by mouth at bedtime. Swallow whole.   Marland Kitchen atorvastatin (LIPITOR) 80 MG tablet Take 1 tablet (80 mg total) by mouth daily at 6 PM.  . clopidogrel (PLAVIX) 75 MG tablet Take 1 tablet (75 mg total) by mouth daily.  . metoprolol succinate (TOPROL-XL) 50 MG 24 hr tablet Take 1 tablet (50 mg total) by mouth daily. Take with or immediately following a meal.  . nitroGLYCERIN (NITROSTAT) 0.4 MG SL tablet Place 0.4 mg under the tongue  every 5 (five) minutes as needed for chest pain.   . ramipril (ALTACE) 10 MG capsule Take 10 mg by mouth daily.     Allergies: No Known Allergies  Social History: The patient  reports that he has never smoked. He has never used smokeless tobacco. He reports that he drinks alcohol. He reports that he does not use drugs.   Family History: The patient's family history includes CAD (age of onset: 62) in his father; CAD (age of onset: 11) in his brother; Diabetes in his brother; Healthy in his mother; Hypertension in his brother.   Review of Systems: Please see the history of present illness.   Otherwise, the review of systems is positive for none.   All other systems are reviewed and negative.   Physical Exam: VS:  BP 138/72 (BP Location: Left Arm, Patient Position: Sitting, Cuff Size: Normal)   Pulse 80   Ht 5\' 9"  (1.753 m)   Wt 174 lb 12.8 oz (79.3 kg)   BMI 25.81 kg/m  .  BMI Body mass index is 25.81 kg/m.  Wt Readings from Last 3 Encounters:  07/23/18 174 lb 12.8 oz (79.3 kg)  07/19/18 174 lb 9.7 oz (79.2 kg)  07/13/18 172 lb 9.9 oz (78.3 kg)    General: Pleasant. Well developed, well nourished and in no acute  distress.   HEENT: Normal.  Neck: Supple, no JVD, carotid bruits, or masses noted.  Cardiac: Regular rate and rhythm. No murmurs, rubs, or gallops. No edema.  Respiratory:  Lungs are clear to auscultation bilaterally with normal work of breathing.  GI: Soft and nontender.  MS: No deformity or atrophy. Gait and ROM intact.  Skin: Warm and dry. Color is normal.  Neuro:  Strength and sensation are intact and no gross focal deficits noted.  Psych: Alert, appropriate and with normal affect.   LABORATORY DATA:  EKG:  EKG is ordered today. This demonstrates NSR with PVC.  Lab Results  Component Value Date   WBC 9.0 07/19/2018   HGB 15.1 07/19/2018   HCT 46.3 07/19/2018   PLT 267 07/19/2018   GLUCOSE 113 (H) 07/19/2018   CHOL 108 07/18/2018   TRIG 87 07/18/2018   HDL 32 (L) 07/18/2018   LDLCALC 59 07/18/2018   ALT 31 07/10/2018   AST 27 07/10/2018   NA 140 07/19/2018   K 4.2 07/19/2018   CL 111 07/19/2018   CREATININE 0.98 07/19/2018   BUN 12 07/19/2018   CO2 23 07/19/2018   INR 1.22 07/18/2018   HGBA1C 5.8 (H) 07/17/2018     BNP (last 3 results) Recent Labs    07/05/18 0934 07/17/18 1617  BNP 44.9 53.1    ProBNP (last 3 results) No results for input(s): PROBNP in the last 8760 hours.   Other Studies Reviewed Today:  LEFT HEART CATH AND CORS/GRAFTS ANGIOGRAPHY 07/10/2018  Conclusion     LV end diastolic pressure is normal.  Prox RCA lesion is 60% stenosed -in-stent restenosis (prior stent was a [employer] DES 2.75 mm x 38 mm stent)  Mid RCA to Dist RCA lesion is 95% stenosed. At band  Post Atrio-1 lesion is 99% stenosed. Ostial followed by Post Atrio-2 lesion is 85% stenosed. -Relatively small caliber vessel  SVG-RCA: Origin to Insertion lesion is 100% stenosed.  Mid LM to Ost LAD lesion is 90% stenosed with 90% stenosed side branch in Ost Cx.  Ost Ramus lesion is 90% stenosed.  SVG-Ramus graft was not visualized due to known occlusion  and  inability to cannulate / visualize: Origin to Prox Graft lesion is 100% stenosed.  Prox Cx to Mid Cx lesion is 100% stenosed after branching OM1.  SVG-OM(LPL) graft was visualized by angiography and is normal in caliber.  Ost LAD to Prox LAD lesion is 100% stenosed.  LIMA-dLAD graft was visualized by angiography and is normal in caliber.   1. Severe native CAD with ostial occlusion of the LAD at the setting of what is now a 90% distal left main into ramus intermedius and circumflex lesion. 2. Extensive stents in the proximal to mid RCA with in-stent restenosis of at least 50 to 60% followed by an 99% focal lesion in the band.  The left posterior lateral branch that has ostial 99% followed by 70% lesions. 3. Occluded SVG-RCA, and unable to visualize what is likely SVG-RI. 4. Widely patent SVG to distal circumflex-OM.  Widely patent LIMA-LAD. 5. Normal LV pressures.  I reviewed the images with Dr. Earney Hamburg.  It was really unclear what the culprit lesion was: The RCA versus the ostial ramus-distal left main.  After discussion we decided the best course of action would be to pause to allow for discussion between interventional colleagues about the possibility of PCI versus redo CABG.  He will return to his nursing unit for ongoing care and TR band removal. Restart IV heparin 8 hours post sheath removal. Hydration for contrast.   Recommend Aspirin 81mg  daily for severe CAD --> will determine plus or minus Thienopyridine antiplatelet agent after reviewing images.   Bryan Lemma, M.D., M.S.   CORONARY STENT INTERVENTION 07/12/2018  Conclusion     #1: Post Atrio-1 lesion is 99% stenosed. Post Atrio-2 lesion is 85% stenosed.  Both lestions: Balloon angioplasty was performed using a BALLOON SAPPHIRE 2.0X12. - reducing to 20-30% residual stenosis  #2: Mid RCA to Dist RCA lesion is 95% stenosed.  A drug-eluting stent was successfully placed using a STENT SIERRA 2.50 X 15 MM. -  post-dilated to 2.8 mm  Post intervention, there is a 0% residual stenosis.  #3: Prox RCA to Mid RCA stent is 60% stenosed.  Post intervention, there is a 10% residual in-stent stenosis.  Scoring balloon angioplasty was performed using a BALLOON SAPPHIRE [place] 3.0X10.  #4 Mid LM to Ost LAD lesion is 90% stenosed with 90% stenosed side branch in Ost Cx. --> Ost Ramus lesion is 90% stenosed. - Unable to cross a BALLOON EUPHORA RX 1.5X10.  Ost LAD to Prox LAD lesion is 100% stenosed.  Prox Cx to Mid Cx lesion is 100% stenosed.  A drug-eluting stent was successfully placed using a STENT SIERRA 2.50 X 15 MM.   1. Successful DES stent of the distal RCA with PTCA of the RPA V and significant in-stent restenosis of the proximal to mid RCA. 2. Aborted attempted PCI of the distal Left Main-ostial RI due to inability to pass 1.5 mill meter balloon.  Plan: After discussion with Dr. Elyn Peers, with my inability to advance a 1.5 mm balloon beyond the midportion of the lesion in the distal Left Main-RI, I felt that I would therefore not be successful expanding stent.  He would likely require atherectomy. The plan will be to evaluate his symptoms in the morning following RCA PCI and if angina free with ambulation would potentially discharge home on medical management and potentially stage CSI PCI of the DLM-RI if symptoms warrant.  Continue aggressive cardiac risk factor medication by primary cardiology service.   Recommend dual antiplatelet therapy  with Aspirin 81mg  daily and Clopidogrel 75mg  daily long-term (beyond 12 months) because of Extensive stented segment in the RCA with potential planned CSI PCI of the distal Left Main-ostial RI.Marland Kitchen   CORONARY ANGIOGRAPHY  CORONARY ATHERECTOMY 07/18/2018  Conclusion     Mid LM to Dist LM lesion is 90% stenosed with 90% stenosed side branch in Ost Ramus.  A drug-eluting stent was successfully placed using a STENT SYNERGY DES 2.75X20. - post-dilated  to 3.3 mm  A drug-eluting stent was successfully placed using a STENT SYNERGY DES 2.5X12 to cover a distal edge dissection. Post-dilated from 3.0-2.6 mm.  Post intervention, there is a 0% residual stenosis in the LEFT MAIN & the Ramus was reduced to 0% residual stenosis.  _________________________________________________________________________________________  Suezanne Jacquet LAD to Prox LAD lesion is 100% stenosed.  LIMA graft was not injected -known to be patent by recent cath  SVG-RCA graft was not visualized due to known occlusion.  SVG-Ramus graft was not visualized due to known occlusion.  Ost Cx lesion is 50% stenosed. --This lesion actually up to be improved from 90% pre-atherectomy  Prox Cx to Mid Cx lesion is 100% stenosed.  SVG-dCx(OM2) graft was not visualized. Known to be open by recent cath.  Prox RCA to Mid RCA previously placed DES stents are 10-20% stenosed at site of recent PTCA.  The recently placed distal RCA Stent from 07/12/2018 is widely patent.  Post Atrio-1 lesion is 20% stenosed. Post Atrio-2 lesion is 10% stenosed. Both lesions appear to be notably improved post PTCA. The ostial PDA is also patent.   Successful orbital atherectomy based PTCA-PCI of the distal Left Main and ostial Ramus Intermedius extending into the proximal Ramus using 2 overlapping DES stents-Synergy 2.75 mm x 20 mm in the original lesion and a 2.5 mm x 12 mm covering the distal edge tear.  The patient will be transferred to 6 Central post procedure unit for sheath removal with manual pressure held.  Plan: Anticipate discharge tomorrow. Continue risk factor modification.  Recommend dual antiplatelet therapy with Aspirin 81mg  daily and Clopidogrel 75mg  daily long-term (beyond 12 months) because of Extensive RCA stents and now LEFT MAIN-RAMUS INTERMEDIUS STENTS.   The patient has actually never seen Dr. Excell Seltzer.  It would not be unreasonable for him to follow-up with either myself or Dr.  Clifton James since we have both seen him in the hospital.   Bryan Lemma, M.D., M.S.     Assessment/Plan:  1. CAD with remote CABG from 2008 - recent recurrence of chest pain with cath, subsequent PCI to the RCA and then athrectomy of the L main/ostial Ramus - on DAPT - he continues to have chest pain. He is quite frustrated. He is agreeable to adding Imdur 30 mg a day - cautioned about headache. He would like to see Dr. Clifton James back for discussion - he would like to stay at this location. He never saw Dr. Excell Seltzer. I explained that he may require repeat cardiac cath if he fails to improve. Will also add Zantac/Pepcid to his regimen. He is to call or go back to the ER if symptoms worsen or fail to improve in the interim.   2. Right subclavian stent - 2016  3. HTN - BP is fine on his current regimen. No changes made today.   4. HLD - on high dose statin therapy - LDL is at goal.   5. Reported bradycardia - may be secondary to the intervention to the RCA - this seems to have improved -  I have left him on his current dose of beta blocker.    Current medicines are reviewed with the patient today.  The patient does not have concerns regarding medicines other than what has been noted above.  The following changes have been made:  See above.  Labs/ tests ordered today include:    Orders Placed This Encounter  Procedures  . Basic metabolic panel  . CBC  . EKG 12-Lead     Disposition:   FU with Dr. Clifton JamesMcAlhany in 2 weeks. I am happy to see again going forward.     Patient is agreeable to this plan and will call if any problems develop in the interim.   SignedNorma Fredrickson: Taleisha Kaczynski, NP  07/23/2018 12:55 PM  [facility] [address] [place], [place]  [address] Phone: [phone] Fax: [phone]

## 2018-07-23 NOTE — Patient Instructions (Addendum)
We will be checking the following labs today - BMET & CBC   Medication Instructions:    Continue with your current medicines.   I am adding Imdur 30 mg a day  Please get OTC Pepcid or Zantac and take daily    Testing/Procedures To Be Arranged:  N/A  Follow-Up:   See Dr. Clifton JamesMcAlhany in about 2 weeks    Other Special Instructions:   N/A    If you need a refill on your cardiac medications before your next appointment, please call your pharmacy.   Call the [facility] office at [phone] if you have any questions, problems or concerns.

## 2018-07-24 LAB — CBC
Hematocrit: 46.4 % (ref 37.5–51.0)
Hemoglobin: 15.9 g/dL (ref 13.0–17.7)
MCH: 31.5 pg (ref 26.6–33.0)
MCHC: 34.3 g/dL (ref 31.5–35.7)
MCV: 92 fL (ref 79–97)
Platelets: 370 10*3/uL (ref 150–450)
RBC: 5.05 x10E6/uL (ref 4.14–5.80)
RDW: 14.2 % (ref 12.3–15.4)
WBC: 10.4 10*3/uL (ref 3.4–10.8)

## 2018-07-24 LAB — BASIC METABOLIC PANEL
BUN/Creatinine Ratio: 21 (ref 10–24)
BUN: 21 mg/dL (ref 8–27)
CO2: 22 mmol/L (ref 20–29)
Calcium: 9.5 mg/dL (ref 8.6–10.2)
Chloride: 104 mmol/L (ref 96–106)
Creatinine, Ser: 1.01 mg/dL (ref 0.76–1.27)
GFR calc Af Amer: 83 mL/min/{1.73_m2} (ref >59)
GFR calc non Af Amer: 71 mL/min/{1.73_m2} (ref >59)
Glucose: 97 mg/dL (ref 65–99)
Potassium: 4.5 mmol/L (ref 3.5–5.2)
Sodium: 140 mmol/L (ref 134–144)

## 2018-07-24 NOTE — Telephone Encounter (Signed)
Attempted to call patient in regards to Cardiac Rehab - LM on VM 

## 2018-07-31 DIAGNOSIS — I208 Other forms of angina pectoris: Secondary | ICD-10-CM | POA: Diagnosis not present

## 2018-07-31 DIAGNOSIS — Z0001 Encounter for general adult medical examination with abnormal findings: Secondary | ICD-10-CM | POA: Diagnosis not present

## 2018-07-31 DIAGNOSIS — I2510 Atherosclerotic heart disease of native coronary artery without angina pectoris: Secondary | ICD-10-CM | POA: Diagnosis not present

## 2018-07-31 DIAGNOSIS — K219 Gastro-esophageal reflux disease without esophagitis: Secondary | ICD-10-CM | POA: Diagnosis not present

## 2018-07-31 DIAGNOSIS — Z951 Presence of aortocoronary bypass graft: Secondary | ICD-10-CM | POA: Diagnosis not present

## 2018-08-01 ENCOUNTER — Telehealth (HOSPITAL_COMMUNITY): Payer: Self-pay

## 2018-08-01 NOTE — Telephone Encounter (Signed)
Called patient regarding CR patient stated he would like to wait until after his appt with Dr. Clifton JamesMcalhany (on 08/06/18).   Will follow up with pt, after his appt.

## 2018-08-03 ENCOUNTER — Observation Stay (HOSPITAL_COMMUNITY)
Admission: EM | Admit: 2018-08-03 | Discharge: 2018-08-04 | Disposition: A | Discharge status: Home / Self Care | Payer: Medicare Other | Attending: Internal Medicine | Admitting: Internal Medicine

## 2018-08-03 ENCOUNTER — Other Ambulatory Visit: Payer: Self-pay

## 2018-08-03 ENCOUNTER — Encounter (HOSPITAL_COMMUNITY): Payer: Self-pay | Admitting: *Deleted

## 2018-08-03 ENCOUNTER — Emergency Department (HOSPITAL_COMMUNITY): Payer: Medicare Other

## 2018-08-03 DIAGNOSIS — I2510 Atherosclerotic heart disease of native coronary artery without angina pectoris: Secondary | ICD-10-CM | POA: Diagnosis present

## 2018-08-03 DIAGNOSIS — Z79899 Other long term (current) drug therapy: Secondary | ICD-10-CM | POA: Diagnosis not present

## 2018-08-03 DIAGNOSIS — I10 Essential (primary) hypertension: Secondary | ICD-10-CM | POA: Diagnosis present

## 2018-08-03 DIAGNOSIS — Z7982 Long term (current) use of aspirin: Secondary | ICD-10-CM | POA: Insufficient documentation

## 2018-08-03 DIAGNOSIS — Z955 Presence of coronary angioplasty implant and graft: Secondary | ICD-10-CM | POA: Diagnosis not present

## 2018-08-03 DIAGNOSIS — Z9889 Other specified postprocedural states: Secondary | ICD-10-CM | POA: Diagnosis not present

## 2018-08-03 DIAGNOSIS — I25110 Atherosclerotic heart disease of native coronary artery with unstable angina pectoris: Secondary | ICD-10-CM | POA: Diagnosis not present

## 2018-08-03 DIAGNOSIS — E7800 Pure hypercholesterolemia, unspecified: Secondary | ICD-10-CM | POA: Insufficient documentation

## 2018-08-03 DIAGNOSIS — Z8249 Family history of ischemic heart disease and other diseases of the circulatory system: Secondary | ICD-10-CM | POA: Insufficient documentation

## 2018-08-03 DIAGNOSIS — Z951 Presence of aortocoronary bypass graft: Secondary | ICD-10-CM | POA: Diagnosis not present

## 2018-08-03 DIAGNOSIS — Z9079 Acquired absence of other genital organ(s): Secondary | ICD-10-CM | POA: Diagnosis not present

## 2018-08-03 DIAGNOSIS — R079 Chest pain, unspecified: Secondary | ICD-10-CM | POA: Diagnosis not present

## 2018-08-03 DIAGNOSIS — Z9842 Cataract extraction status, left eye: Secondary | ICD-10-CM | POA: Insufficient documentation

## 2018-08-03 DIAGNOSIS — Z7901 Long term (current) use of anticoagulants: Secondary | ICD-10-CM | POA: Insufficient documentation

## 2018-08-03 DIAGNOSIS — J9811 Atelectasis: Secondary | ICD-10-CM | POA: Diagnosis not present

## 2018-08-03 DIAGNOSIS — I493 Ventricular premature depolarization: Secondary | ICD-10-CM | POA: Insufficient documentation

## 2018-08-03 DIAGNOSIS — Z9841 Cataract extraction status, right eye: Secondary | ICD-10-CM | POA: Insufficient documentation

## 2018-08-03 LAB — BASIC METABOLIC PANEL
Anion gap: 13 (ref 5–15)
BUN: 16 mg/dL (ref 8–23)
CALCIUM: 9.8 mg/dL (ref 8.9–10.3)
CHLORIDE: 108 mmol/L (ref 98–111)
CO2: 19 mmol/L — ABNORMAL LOW (ref 22–32)
CREATININE: 1.17 mg/dL (ref 0.61–1.24)
GFR calc Af Amer: >60 mL/min (ref >60)
GFR calc non Af Amer: 58 mL/min — ABNORMAL LOW (ref >60)
Glucose, Bld: 110 mg/dL — ABNORMAL HIGH (ref 70–99)
Potassium: 4.1 mmol/L (ref 3.5–5.1)
SODIUM: 140 mmol/L (ref 135–145)

## 2018-08-03 LAB — CBC
HCT: 49.1 % (ref 39.0–52.0)
Hemoglobin: 15.6 g/dL (ref 13.0–17.0)
MCH: 29.9 pg (ref 26.0–34.0)
MCHC: 31.8 g/dL (ref 30.0–36.0)
MCV: 94.2 fL (ref 78.0–100.0)
PLATELETS: 347 10*3/uL (ref 150–400)
RBC: 5.21 MIL/uL (ref 4.22–5.81)
RDW: 13.5 % (ref 11.5–15.5)
WBC: 11 10*3/uL — AB (ref 4.0–10.5)

## 2018-08-03 LAB — I-STAT TROPONIN, ED: Troponin i, poc: 0.02 ng/mL (ref 0.00–0.08)

## 2018-08-03 NOTE — ED Provider Notes (Signed)
Patient placed in Quick Look pathway, seen and evaluated   Chief Complaint: Chest pain  HPI: Patient with history of coronary artery disease, stent placement end of July 2019, on Plavix and compliant --presents with complaint of chest pain in the mid chest starting 2 hours ago.  Patient has taken 4 nitroglycerin with gradual improvement in symptoms.  No nausea, vomiting, diaphoresis.  ROS:  Positive ROS: (+) Chest pain Negative ROS: (-) Abdominal pain  Physical Exam:   Gen: No distress  Neuro: Awake and Alert  Skin: Warm    Focused Exam: General comfortable appearing heart RRR, nml S1,S2, no m/r/g; Lungs CTAB; Abd soft, NT, no rebound or guarding; Ext 2+ pedal pulses bilaterally, no edema.  BP 139/78   Pulse 84   Temp 97.9 F (36.6 C) (Oral)   Resp (!) 22   Ht 5\' 9"  (1.753 m)   Wt 79.3 kg   SpO2 99%   BMI 25.82 kg/m   Plan: Chest pain evaluation with EKG, EKG reviewed without signs of STEMI  Initiation of care has begun. The patient has been counseled on the process, plan, and necessity for staying for the completion/evaluation, and the remainder of the medical screening examination    Renne CriglerGeiple, Elner Seifert, Cordelia Poche-C 08/03/18 1815    Gerhard MunchLockwood, Robert, MD 08/03/18 2102

## 2018-08-03 NOTE — ED Triage Notes (Signed)
Pt states chest pain x 1 hour.  Stent placement this week.  Denies pain after taking 4 sl nitro.

## 2018-08-04 ENCOUNTER — Other Ambulatory Visit: Payer: Self-pay

## 2018-08-04 ENCOUNTER — Encounter (HOSPITAL_COMMUNITY): Payer: Self-pay | Admitting: Internal Medicine

## 2018-08-04 DIAGNOSIS — I10 Essential (primary) hypertension: Secondary | ICD-10-CM

## 2018-08-04 DIAGNOSIS — Z7901 Long term (current) use of anticoagulants: Secondary | ICD-10-CM | POA: Diagnosis not present

## 2018-08-04 DIAGNOSIS — Z9841 Cataract extraction status, right eye: Secondary | ICD-10-CM | POA: Diagnosis not present

## 2018-08-04 DIAGNOSIS — R079 Chest pain, unspecified: Secondary | ICD-10-CM

## 2018-08-04 DIAGNOSIS — E7800 Pure hypercholesterolemia, unspecified: Secondary | ICD-10-CM | POA: Diagnosis not present

## 2018-08-04 DIAGNOSIS — Z9842 Cataract extraction status, left eye: Secondary | ICD-10-CM | POA: Diagnosis not present

## 2018-08-04 DIAGNOSIS — Z79899 Other long term (current) drug therapy: Secondary | ICD-10-CM | POA: Diagnosis not present

## 2018-08-04 DIAGNOSIS — Z7982 Long term (current) use of aspirin: Secondary | ICD-10-CM | POA: Diagnosis not present

## 2018-08-04 DIAGNOSIS — Z8249 Family history of ischemic heart disease and other diseases of the circulatory system: Secondary | ICD-10-CM | POA: Diagnosis not present

## 2018-08-04 DIAGNOSIS — I493 Ventricular premature depolarization: Secondary | ICD-10-CM | POA: Diagnosis not present

## 2018-08-04 DIAGNOSIS — I25110 Atherosclerotic heart disease of native coronary artery with unstable angina pectoris: Secondary | ICD-10-CM | POA: Diagnosis not present

## 2018-08-04 DIAGNOSIS — J9811 Atelectasis: Secondary | ICD-10-CM | POA: Diagnosis not present

## 2018-08-04 DIAGNOSIS — Z951 Presence of aortocoronary bypass graft: Secondary | ICD-10-CM | POA: Diagnosis not present

## 2018-08-04 LAB — CBC
HCT: 47.2 % (ref 39.0–52.0)
Hemoglobin: 15.4 g/dL (ref 13.0–17.0)
MCH: 30.4 pg (ref 26.0–34.0)
MCHC: 32.6 g/dL (ref 30.0–36.0)
MCV: 93.3 fL (ref 78.0–100.0)
PLATELETS: 286 10*3/uL (ref 150–400)
RBC: 5.06 MIL/uL (ref 4.22–5.81)
RDW: 13.5 % (ref 11.5–15.5)
WBC: 8.8 10*3/uL (ref 4.0–10.5)

## 2018-08-04 LAB — TROPONIN I
Troponin I: <0.03 ng/mL (ref <0.03)
Troponin I: <0.03 ng/mL (ref <0.03)

## 2018-08-04 LAB — CREATININE, SERUM
Creatinine, Ser: 1.04 mg/dL (ref 0.61–1.24)
GFR calc Af Amer: >60 mL/min (ref >60)
GFR calc non Af Amer: >60 mL/min (ref >60)

## 2018-08-04 MED ORDER — ISOSORBIDE MONONITRATE ER 30 MG PO TB24: 30.0000 mg | ORAL_TABLET | Freq: Every day | ORAL | Status: DC

## 2018-08-04 MED ORDER — METOPROLOL SUCCINATE ER 50 MG PO TB24
50.0000 mg | ORAL_TABLET | Freq: Every day | ORAL | Status: DC
  Administered 2018-08-04: 50 mg via ORAL
  Filled 2018-08-04: qty 1

## 2018-08-04 MED ORDER — VITAMIN D 1000 UNITS PO TABS: 2000.0000 [IU] | ORAL_TABLET | Freq: Every day | ORAL | Status: DC

## 2018-08-04 MED ORDER — FAMOTIDINE 20 MG PO TABS
20.0000 mg | ORAL_TABLET | Freq: Two times a day (BID) | ORAL | Status: DC
  Administered 2018-08-04: 20 mg via ORAL
  Filled 2018-08-04: qty 1

## 2018-08-04 MED ORDER — ACETAMINOPHEN 325 MG PO TABS: 650.0000 mg | ORAL_TABLET | ORAL | Status: DC | PRN

## 2018-08-04 MED ORDER — RAMIPRIL 2.5 MG PO CAPS: 10.0000 mg | ORAL_CAPSULE | Freq: Every day | ORAL | Status: DC

## 2018-08-04 MED ORDER — CLOPIDOGREL BISULFATE 75 MG PO TABS
75.0000 mg | ORAL_TABLET | Freq: Every day | ORAL | Status: DC
  Administered 2018-08-04: 75 mg via ORAL
  Filled 2018-08-04: qty 1

## 2018-08-04 MED ORDER — NITROGLYCERIN 0.4 MG SL SUBL: 0.4000 mg | SUBLINGUAL_TABLET | SUBLINGUAL | Status: DC | PRN

## 2018-08-04 MED ORDER — ENOXAPARIN SODIUM 40 MG/0.4ML ~~LOC~~ SOLN
40.0000 mg | SUBCUTANEOUS | Status: DC
  Filled 2018-08-04: qty 0.4

## 2018-08-04 MED ORDER — ONDANSETRON HCL 4 MG/2ML IJ SOLN: 4.0000 mg | Freq: Four times a day (QID) | INTRAMUSCULAR | Status: DC | PRN

## 2018-08-04 MED ORDER — ISOSORBIDE MONONITRATE ER 60 MG PO TB24
60.0000 mg | ORAL_TABLET | Freq: Every day | ORAL | Status: DC
  Administered 2018-08-04: 60 mg via ORAL
  Filled 2018-08-04: qty 1

## 2018-08-04 MED ORDER — AMLODIPINE BESYLATE 10 MG PO TABS
10.0000 mg | ORAL_TABLET | Freq: Every day | ORAL | Status: DC
  Administered 2018-08-04: 10 mg via ORAL
  Filled 2018-08-04: qty 1

## 2018-08-04 MED ORDER — ASPIRIN EC 81 MG PO TBEC: 81.0000 mg | DELAYED_RELEASE_TABLET | Freq: Every day | ORAL | Status: DC

## 2018-08-04 MED ORDER — ISOSORBIDE MONONITRATE ER 60 MG PO TB24: 60.0000 mg | ORAL_TABLET | Freq: Every day | ORAL | 0 refills | Status: DC

## 2018-08-04 MED ORDER — ATORVASTATIN CALCIUM 80 MG PO TABS: 80.0000 mg | ORAL_TABLET | Freq: Every day | ORAL | Status: DC

## 2018-08-04 NOTE — Discharge Summary (Signed)
Physician Discharge Summary  Francia GreavesRichard Schlagel ZOX:[id number] DOB: February 28, 1941 DOA: 08/03/2018  PCP: Georgann HousekeeperHusain, Karrar, MD  Admit date: 08/03/2018 Discharge date: 08/04/2018  Admitted From: Home  Disposition: home   Recommendations for Outpatient Follow-up:  1. Follow up with PCP in 1-2 weeks 2. Please obtain BMP/CBC in one week 3. Follow up with cardiology for further care of CAD.    Discharge Condition:Stable.  CODE STATUS: full code.  Diet recommendation: Heart Healthy  Brief/Interim Summary: Francia GreavesRichard Fiddler is a 77 y.o. male with history of CAD status post recent stenting 2 weeks ago with previous history of CABG, hypertension presents to the ER with complaint of chest pain.  Patient states chest pain started last afternoon at home which was left-sided pressure-like radiating to left shoulder.  Denies any shortness of breath diaphoresis productive cough fever chills.  ED Course: In the ER patient chest pain resolved without any intervention.  Cardiac markers were negative EKG was unremarkable given the history of CAD patient admitted for further management of chest pain.   1-Chest pain; concern for angina,  recent stent. Admitted to rule out. Troponin times 3 negative. EKG no significant changes. Discussed with Dr Purvis SheffieldKoneswaran, troponin negative, no EKG changes , plan to increase Imdur dose to 60 mg daily.  Patient was chest pain free. He was feeling well and wishes to go home. Plan to discharge later today if last troponin negative.   2-HTN; continue with ramipril, metoprolol.   Discharge Diagnoses:  Principal Problem:   Chest pain Active Problems:   CAD (coronary artery disease)   Essential (primary) hypertension    Discharge Instructions   Allergies as of 08/04/2018   No Known Allergies     Medication List    TAKE these medications   amLODipine 10 MG tablet Commonly known as:  NORVASC Take 10 mg by mouth daily.   aspirin 81 MG EC tablet Take 81 mg by mouth at bedtime.  Swallow whole.   atorvastatin 80 MG tablet Commonly known as:  LIPITOR Take 1 tablet (80 mg total) by mouth daily at 6 PM. What changed:  when to take this   clopidogrel 75 MG tablet Commonly known as:  PLAVIX Take 1 tablet (75 mg total) by mouth daily.   DRY EYES OP Place 1 drop into both eyes as needed (dry eyes).   isosorbide mononitrate 60 MG 24 hr tablet Commonly known as:  IMDUR Take 1 tablet (60 mg total) by mouth daily. Start taking on:  08/05/2018 What changed:    medication strength  how much to take   metoprolol succinate 50 MG 24 hr tablet Commonly known as:  TOPROL-XL Take 1 tablet (50 mg total) by mouth daily. Take with or immediately following a meal.   nitroGLYCERIN 0.4 MG SL tablet Commonly known as:  NITROSTAT Place 0.4 mg under the tongue every 5 (five) minutes as needed for chest pain.   ramipril 10 MG capsule Commonly known as:  ALTACE Take 10 mg by mouth at bedtime.   ranitidine 150 MG tablet Commonly known as:  ZANTAC Take 150 mg by mouth 2 (two) times daily.   Vitamin D2 2000 units Tabs Take 2,000 Units by mouth at bedtime.       No Known Allergies  Consultations: Cardiology, over phone   Procedures/Studies: Dg Chest 2 View  Result Date: 08/03/2018 CLINICAL DATA:  Patient with chest pain. EXAM: CHEST - 2 VIEW COMPARISON:  Chest radiograph 07/17/2018. FINDINGS: Patient is mildly rotated to the left. Stable cardiac  and mediastinal contours status post median sternotomy. Mild elevation left hemidiaphragm. Left basilar atelectasis/scarring. No large area pulmonary consolidation. No pleural effusion or pneumothorax. Regional skeleton is unremarkable. IMPRESSION: No acute cardiopulmonary process. Left basilar atelectasis/scarring. Electronically Signed   By: Annia Belt M.D.   On: 08/03/2018 19:11   Dg Chest 2 View  Result Date: 07/17/2018 CLINICAL DATA:  Chest pain.  Shortness of breath. EXAM: CHEST - 2 VIEW COMPARISON:  07/08/2018.  FINDINGS: Prior CABG. Heart size normal. Mild bibasilar subsegmental atelectasis and/or scarring again noted. No pleural effusion or pneumothorax. IMPRESSION: 1.  Mild bibasilar subsegmental atelectasis and/or scarring. 2.  Prior CABG.  Heart size normal. Electronically Signed   By: Maisie Fus  Register   On: 07/17/2018 17:21   Dg Chest 2 View  Result Date: 07/08/2018 CLINICAL DATA:  Chest pain and shortness of breath EXAM: CHEST - 2 VIEW COMPARISON:  July 05, 2018 FINDINGS: There is no appreciable edema or consolidation. Heart size and pulmonary vascularity are normal. No adenopathy. Patient is status post internal mammary bypass grafting. There is aortic atherosclerosis. No evident bone lesions. IMPRESSION: Postoperative changes. Aortic atherosclerosis. No edema or consolidation. Aortic Atherosclerosis (ICD10-I70.0). Electronically Signed   By: Bretta Bang III M.D.   On: 07/08/2018 21:35      Subjective: Chest pain free. Feeling well  Discharge Exam: Vitals:   08/04/18 1000 08/04/18 1156  BP: (!) 156/88 114/75  Pulse:  77  Resp:  20  Temp:  98 F (36.7 C)  SpO2:  98%   Vitals:   08/04/18 0233 08/04/18 0556 08/04/18 1000 08/04/18 1156  BP: 138/68 126/67 (!) 156/88 114/75  Pulse: 66 83  77  Resp: 17 17  20   Temp: 98 F (36.7 C) 97.9 F (36.6 C)  98 F (36.7 C)  TempSrc: Oral Oral  Oral  SpO2: 100% 100%  98%  Weight: 76.8 kg     Height:        General: Pt is alert, awake, not in acute distress Cardiovascular: RRR, S1/S2 +, no rubs, no gallops Respiratory: CTA bilaterally, no wheezing, no rhonchi Abdominal: Soft, NT, ND, bowel sounds + Extremities: no edema, no cyanosis    The results of significant diagnostics from this hospitalization (including imaging, microbiology, ancillary and laboratory) are listed below for reference.     Microbiology: No results found for this or any previous visit (from the past 240 hour(s)).   Labs: BNP (last 3 results) Recent Labs     07/05/18 0934 07/17/18 1617  BNP 44.9 53.1   Basic Metabolic Panel: Recent Labs  Lab 08/03/18 1812 08/04/18 0431  NA 140  --   K 4.1  --   CL 108  --   CO2 19*  --   GLUCOSE 110*  --   BUN 16  --   CREATININE 1.17 1.04  CALCIUM 9.8  --    Liver Function Tests: No results for input(s): AST, ALT, ALKPHOS, BILITOT, PROT, ALBUMIN in the last 168 hours. No results for input(s): LIPASE, AMYLASE in the last 168 hours. No results for input(s): AMMONIA in the last 168 hours. CBC: Recent Labs  Lab 08/03/18 1812 08/04/18 0431  WBC 11.0* 8.8  HGB 15.6 15.4  HCT 49.1 47.2  MCV 94.2 93.3  PLT 347 286   Cardiac Enzymes: Recent Labs  Lab 08/04/18 0431 08/04/18 1021  TROPONINI <0.03 <0.03   BNP: Invalid input(s): POCBNP CBG: No results for input(s): GLUCAP in the last 168 hours. D-Dimer No results for  input(s): DDIMER in the last 72 hours. Hgb A1c No results for input(s): HGBA1C in the last 72 hours. Lipid Profile No results for input(s): CHOL, HDL, LDLCALC, TRIG, CHOLHDL, LDLDIRECT in the last 72 hours. Thyroid function studies No results for input(s): TSH, T4TOTAL, T3FREE, THYROIDAB in the last 72 hours.  Invalid input(s): FREET3 Anemia work up No results for input(s): VITAMINB12, FOLATE, FERRITIN, TIBC, IRON, RETICCTPCT in the last 72 hours. Urinalysis    Component Value Date/Time   COLORURINE YELLOW 07/05/2018 1241   APPEARANCEUR CLEAR 07/05/2018 1241   LABSPEC 1.019 07/05/2018 1241   PHURINE 6.0 07/05/2018 1241   GLUCOSEU NEGATIVE 07/05/2018 1241   HGBUR NEGATIVE 07/05/2018 1241   BILIRUBINUR NEGATIVE 07/05/2018 1241   KETONESUR NEGATIVE 07/05/2018 1241   PROTEINUR NEGATIVE 07/05/2018 1241   NITRITE NEGATIVE 07/05/2018 1241   LEUKOCYTESUR NEGATIVE 07/05/2018 1241   Sepsis Labs Invalid input(s): PROCALCITONIN,  WBC,  LACTICIDVEN Microbiology No results found for this or any previous visit (from the past 240 hour(s)).   Time coordinating discharge: 35  minutes.   SIGNED:   Alba CoryBelkys A Rivka Baune, MD  [facility] 08/04/2018, 2:42 PM Pager [phone]  If 7PM-7AM, please contact night-coverage [email or web address] Password [email or web address]

## 2018-08-04 NOTE — Plan of Care (Signed)
  Problem: Activity: Goal: Ability to tolerate increased activity will improve Outcome: Completed/Met   Problem: Cardiac: Goal: Ability to achieve and maintain adequate cardiovascular perfusion will improve Outcome: Completed/Met

## 2018-08-04 NOTE — Progress Notes (Signed)
Discharge orders in, still waiting for lab to call me about the troponin level so I can let the MD know before the patient can leave.

## 2018-08-04 NOTE — H&P (Signed)
History and Physical    Robert [name] [id number] DOB: 1941-06-19 DOA: 08/03/2018  PCP: Georgann HousekeeperHusain, Karrar, MD  Patient coming from: Home.  Chief Complaint: Chest pain.  HPI: Robert [name] is a 77 y.o. male with history of CAD status post recent stenting 2 weeks ago with previous history of CABG, hypertension presents to the ER with complaint of chest pain.  Patient states chest pain started last afternoon at home which was left-sided pressure-like radiating to left shoulder.  Denies any shortness of breath diaphoresis productive cough fever chills.  ED Course: In the ER patient chest pain resolved without any intervention.  Cardiac markers were negative EKG was unremarkable given the history of CAD patient admitted for further management of chest pain.  Review of Systems: As per HPI, rest all negative.   Past Medical History:  Diagnosis Date  . Arthritis    "a little in my fingers; never tx'd" (07/17/2018)  . CAD (coronary artery disease)    a. CABG X4 2008. b. [place]in [place] b. PCI/DES to RCA (07/12/18) c. PCI/DES to LM-ramus (07/17/18)  . Foot fracture, right 1996  . High cholesterol   . Hypertension   . Leg hematoma 01/2018   RLE; "no tx"  . Presence of arterial stent 2016   Right subclavian artery  . Skipped heart beats   . Stenosis of subclavian artery (HCC)    a. s/p R subclavian stent 2016.    Past Surgical History:  Procedure Laterality Date  . AORTIC ARCH ANGIOGRAPHY N/A 07/10/2018   Procedure: AORTIC ARCH ANGIOGRAPHY;  Surgeon: Marykay LexHarding, David W, MD;  [facility];  Service: Cardiovascular;  Laterality: N/A;  . CATARACT EXTRACTION W/ INTRAOCULAR LENS  IMPLANT, BILATERAL Bilateral   . CORONARY ANGIOGRAPHY N/A 07/18/2018   Procedure: CORONARY ANGIOGRAPHY;  Surgeon: Marykay LexHarding, David W, MD;  [facility];  Service: Cardiovascular;  Laterality: N/A;  . CORONARY ANGIOPLASTY WITH STENT PLACEMENT  2016  . CORONARY ARTERY BYPASS GRAFT  2008   "CABG X4"  . CORONARY ATHERECTOMY N/A 07/18/2018   Procedure: CORONARY ATHERECTOMY;  Surgeon: Marykay LexHarding, David W, MD;  [facility];  Service: Cardiovascular;  Laterality: N/A;  . CORONARY STENT INTERVENTION N/A 07/12/2018   Procedure: CORONARY STENT INTERVENTION;  Surgeon: Marykay LexHarding, David W, MD;  [facility];  Service: Cardiovascular;  Laterality: N/A;  . LEFT HEART CATH AND CORS/GRAFTS ANGIOGRAPHY N/A 07/10/2018   Procedure: LEFT HEART CATH AND CORS/GRAFTS ANGIOGRAPHY;  Surgeon: Marykay LexHarding, David W, MD;  [facility];  Service: Cardiovascular;  Laterality: N/A;  . PROSTATECTOMY  ~ 2012   "removed most of it; no cancer" (07/17/2018)  . SUBCLAVIAN ARTERY STENT Right 2016     reports that he has never smoked. He has never used smokeless tobacco. He reports that he drinks alcohol. He reports that he does not use drugs.  No Known Allergies  Family History  Problem Relation Age of Onset  . Healthy Mother        lived to be 299  . CAD Father 7648  . Diabetes Brother   . Hypertension Brother   . CAD Brother 2372       CABG    Prior to Admission medications   Medication Sig Start Date End Date Taking? Authorizing Provider  amLODipine (NORVASC) 10 MG tablet Take 10 mg by mouth daily.   Yes [provider]  Artificial Tear Ointment (DRY EYES OP) Place 1 drop  into both eyes as needed (dry eyes).   Yes [provider]  aspirin 81 MG EC tablet Take 81 mg by mouth at bedtime. Swallow whole.    Yes [provider]  atorvastatin (LIPITOR) 80 MG tablet Take 1 tablet (80 mg total) by mouth daily at 6 PM. Patient taking differently: Take 80 mg by mouth at bedtime.  07/19/18  Yes Arty Baumgartner, NP  clopidogrel (PLAVIX) 75 MG tablet Take 1 tablet (75 mg total) by mouth daily. 07/19/18  Yes Arty Baumgartner, NP  Ergocalciferol (VITAMIN D2) 2000 units TABS Take 2,000 Units by mouth at bedtime.   Yes [provider]  isosorbide  mononitrate (IMDUR) 30 MG 24 hr tablet Take 1 tablet (30 mg total) by mouth daily. 07/23/18 10/21/18 Yes Rosalio Macadamia, NP  metoprolol succinate (TOPROL-XL) 50 MG 24 hr tablet Take 1 tablet (50 mg total) by mouth daily. Take with or immediately following a meal. 07/19/18  Yes Laverda Page B, NP  nitroGLYCERIN (NITROSTAT) 0.4 MG SL tablet Place 0.4 mg under the tongue every 5 (five) minutes as needed for chest pain.  03/28/18  Yes [provider]  ramipril (ALTACE) 10 MG capsule Take 10 mg by mouth at bedtime.    Yes [provider]  ranitidine (ZANTAC) 150 MG tablet Take 150 mg by mouth 2 (two) times daily.   Yes [provider]    Physical Exam: Vitals:   08/04/18 0009 08/04/18 0100 08/04/18 0145 08/04/18 0233  BP: (!) 127/103 (!) 142/84 133/89 138/68  Pulse: 86 72 70 66  Resp: 18 12 20 17   Temp: 98.2 F (36.8 C)   98 F (36.7 C)  TempSrc: Oral   Oral  SpO2: 99% 100% 98% 100%  Weight:      Height:          Constitutional: Moderately built and nourished. Vitals:   08/04/18 0009 08/04/18 0100 08/04/18 0145 08/04/18 0233  BP: (!) 127/103 (!) 142/84 133/89 138/68  Pulse: 86 72 70 66  Resp: 18 12 20 17   Temp: 98.2 F (36.8 C)   98 F (36.7 C)  TempSrc: Oral   Oral  SpO2: 99% 100% 98% 100%  Weight:      Height:       Eyes: Anicteric no pallor. ENMT: No discharge from the ears eyes nose or mouth. Neck: No mass felt.  No neck rigidity.  No JVD appreciated. Respiratory: No rhonchi or crepitations. Cardiovascular: S1-S2 heard no murmurs appreciated. Abdomen: Soft nontender bowel sounds present. Musculoskeletal: No edema.  No joint effusion. Skin: No rash. Neurologic: Alert awake oriented to time place and person.  Moves all extremities. Psychiatric: Appears normal.  Normal affect.   Labs on Admission: I have personally reviewed following labs and imaging studies  CBC: Recent Labs  Lab 08/03/18 1812  WBC 11.0*  HGB 15.6  HCT 49.1  MCV 94.2    PLT 347   Basic Metabolic Panel: Recent Labs  Lab 08/03/18 1812  NA 140  K 4.1  CL 108  CO2 19*  GLUCOSE 110*  BUN 16  CREATININE 1.17  CALCIUM 9.8   GFR: Estimated Creatinine Clearance: 52.9 mL/min (by C-G formula based on SCr of 1.17 mg/dL). Liver Function Tests: No results for input(s): AST, ALT, ALKPHOS, BILITOT, PROT, ALBUMIN in the last 168 hours. No results for input(s): LIPASE, AMYLASE in the last 168 hours. No results for input(s): AMMONIA in the last 168 hours. Coagulation Profile: No results for input(s):  INR, PROTIME in the last 168 hours. Cardiac Enzymes: No results for input(s): CKTOTAL, CKMB, CKMBINDEX, TROPONINI in the last 168 hours. BNP (last 3 results) No results for input(s): PROBNP in the last 8760 hours. HbA1C: No results for input(s): HGBA1C in the last 72 hours. CBG: No results for input(s): GLUCAP in the last 168 hours. Lipid Profile: No results for input(s): CHOL, HDL, LDLCALC, TRIG, CHOLHDL, LDLDIRECT in the last 72 hours. Thyroid Function Tests: No results for input(s): TSH, T4TOTAL, FREET4, T3FREE, THYROIDAB in the last 72 hours. Anemia Panel: No results for input(s): VITAMINB12, FOLATE, FERRITIN, TIBC, IRON, RETICCTPCT in the last 72 hours. Urine analysis:    Component Value Date/Time   COLORURINE YELLOW 07/05/2018 1241   APPEARANCEUR CLEAR 07/05/2018 1241   LABSPEC 1.019 07/05/2018 1241   PHURINE 6.0 07/05/2018 1241   GLUCOSEU NEGATIVE 07/05/2018 1241   HGBUR NEGATIVE 07/05/2018 1241   BILIRUBINUR NEGATIVE 07/05/2018 1241   KETONESUR NEGATIVE 07/05/2018 1241   PROTEINUR NEGATIVE 07/05/2018 1241   NITRITE NEGATIVE 07/05/2018 1241   LEUKOCYTESUR NEGATIVE 07/05/2018 1241   Sepsis Labs: @LABRCNTIP (procalcitonin:4,lacticidven:4) )No results found for this or any previous visit (from the past 240 hour(s)).   Radiological Exams on Admission: Dg Chest 2 View  Result Date: 08/03/2018 CLINICAL DATA:  Patient with chest pain. EXAM:  CHEST - 2 VIEW COMPARISON:  Chest radiograph 07/17/2018. FINDINGS: Patient is mildly rotated to the left. Stable cardiac and mediastinal contours status post median sternotomy. Mild elevation left hemidiaphragm. Left basilar atelectasis/scarring. No large area pulmonary consolidation. No pleural effusion or pneumothorax. Regional skeleton is unremarkable. IMPRESSION: No acute cardiopulmonary process. Left basilar atelectasis/scarring. Electronically Signed   By: Annia Beltrew  Davis M.D.   On: 08/03/2018 19:11    EKG: Independently reviewed.  Normal sinus rhythm.  Assessment/Plan Principal Problem:   Chest pain Active Problems:   CAD (coronary artery disease)   Essential (primary) hypertension    1. Chest pain with recent stent placement with history of CAD concerning for unstable angina.  Patient is chest pain-free we will cycle cardiac markers continue aspirin Plavix statins metoprolol Imdur.  Cardiology notified. 2. Hypertension on ramipril and metoprolol.   DVT prophylaxis: Lovenox. Code Status: Full code. Family Communication: Discussed with patient. Disposition Plan: Home. Consults called: Cardiology notified. Admission status: Observation.   Eduard ClosArshad N Sharnika Binney MD [facility] Pager [phone].  If 7PM-7AM, please contact night-coverage [email or web address] Password [email or web address]  08/04/2018, 3:10 AM

## 2018-08-04 NOTE — Progress Notes (Signed)
Chief Complaint  Patient presents with  . Follow-up    CAD   History of Present Illness: 77 yo male with history of CAD s/p CABG in 2008, HTN, HLD and subclavian artery stenosis who is here today for cardiac follow up. [place] CABG was in 2008 in [place]. Prior right subclavian artery stent in 2016. He underwent a nuclear stress in June 2019 which showed a fixed inferior defect and no ischemia. He began having chest pain in July 2019 and underwent a cardiac cath on 07/10/18 which showed 90% distal left main stenosis, occluded proximal LAD, 90% ostial Circumflex stenosis, 60% in stent restenosis RCA and severe distal RCA stenosis. LIMA to LAD patent. SVG to OM was patent. SVG to RCA and intermediate occluded. PCI of the RCA was performed with placement of 3 drug eluting stents. He then had staged PCI of the distal left main artery into the intermediate vessel with atherectomy and placement of 2 drug eluting stents. He was seen in our office 07/23/18 and had c/o continued chest pain. [facility] 08[facility]03[facility]18 with chest pain and ruled out for MI. Imdur increased and he was discharged home on 08/04/18.   He is here today for follow up. He describes several episodes of chest pressure. Also palpitations with associated breathlessness. No heavy chest pain. No dizziness, near syncope or syncope.   Primary Care Physician: Georgann HousekeeperHusain, Karrar, MD  Past Medical History:  Diagnosis Date  . Arthritis    "a little in my fingers; never tx'd" (07/17/2018)  . CAD (coronary artery disease)    a. CABG X4 2008. b. MI and stent (in [place]) b. PCI/DES to RCA (07/12/18) c. PCI/DES to LM-ramus (07/17/18)  . Foot fracture, right 1996  . High cholesterol   . Hypertension   . Leg hematoma 01/2018   RLE; "no tx"  . Presence of arterial stent 2016   Right subclavian artery  . Skipped heart beats   . Stenosis of subclavian artery (HCC)    a. s/p R subclavian stent 2016.    Past Surgical History:  Procedure Laterality Date  .  AORTIC ARCH ANGIOGRAPHY N/A 07/10/2018   Procedure: AORTIC ARCH ANGIOGRAPHY;  Surgeon: Marykay LexHarding, David W, MD;  [facility];  Service: Cardiovascular;  Laterality: N/A;  . CATARACT EXTRACTION W/ INTRAOCULAR LENS  IMPLANT, BILATERAL Bilateral   . CORONARY ANGIOGRAPHY N/A 07/18/2018   Procedure: CORONARY ANGIOGRAPHY;  Surgeon: Marykay LexHarding, David W, MD;  [facility];  Service: Cardiovascular;  Laterality: N/A;  . CORONARY ANGIOPLASTY WITH STENT PLACEMENT  2016  . CORONARY ARTERY BYPASS GRAFT  2008   "CABG X4"  . CORONARY ATHERECTOMY N/A 07/18/2018   Procedure: CORONARY ATHERECTOMY;  Surgeon: Marykay LexHarding, David W, MD;  [facility];  Service: Cardiovascular;  Laterality: N/A;  . CORONARY STENT INTERVENTION N/A 07/12/2018   Procedure: CORONARY STENT INTERVENTION;  Surgeon: Marykay LexHarding, David W, MD;  [facility];  Service: Cardiovascular;  Laterality: N/A;  . LEFT HEART CATH AND CORS/GRAFTS ANGIOGRAPHY N/A 07/10/2018   Procedure: LEFT HEART CATH AND CORS/GRAFTS ANGIOGRAPHY;  Surgeon: Marykay LexHarding, David W, MD;  [facility];  Service: Cardiovascular;  Laterality: N/A;  . PROSTATECTOMY  ~ 2012   "removed most of it; no cancer" (07/17/2018)  . SUBCLAVIAN ARTERY STENT Right 2016    Current Outpatient Medications  Medication Sig Dispense Refill  . amLODipine (NORVASC) 10 MG tablet Take 10 mg by mouth daily.    .Marland Kitchen  Artificial Tear Ointment (DRY EYES OP) Place 1 drop into both eyes as needed (dry eyes).    Marland Kitchen aspirin 81 MG EC tablet Take 81 mg by mouth at bedtime. Swallow whole.     Marland Kitchen atorvastatin (LIPITOR) 80 MG tablet Take 1 tablet (80 mg total) by mouth daily at 6 PM. (Patient taking differently: Take 80 mg by mouth at bedtime. ) 90 tablet 1  . clopidogrel (PLAVIX) 75 MG tablet Take 1 tablet (75 mg total) by mouth daily. 90 tablet 2  . Ergocalciferol (VITAMIN D2) 2000 units TABS Take 2,000 Units by mouth at bedtime.    . isosorbide mononitrate  (IMDUR) 60 MG 24 hr tablet Take 1 tablet (60 mg total) by mouth daily. 30 tablet 0  . nitroGLYCERIN (NITROSTAT) 0.4 MG SL tablet Place 0.4 mg under the tongue every 5 (five) minutes as needed for chest pain.   1  . ramipril (ALTACE) 10 MG capsule Take 10 mg by mouth at bedtime.     . ranitidine (ZANTAC) 150 MG tablet Take 150 mg by mouth 2 (two) times daily.    . metoprolol succinate (TOPROL-XL) 50 MG 24 hr tablet Take 1 tablet (50 mg total) by mouth 2 (two) times daily. Take with or immediately following a meal. 180 tablet 3  . ranolazine (RANEXA) 500 MG 12 hr tablet Take 1 tablet (500 mg total) by mouth 2 (two) times daily. 60 tablet 6   No current facility-administered medications for this visit.     No Known Allergies  Social History   Socioeconomic History  . Marital status: Married    Spouse name: Not on file  . Number of children: 2  . Years of education: COLLEGE  . Highest education level: Not on file  Occupational History  . Occupation: RETIRED  Social Needs  . Financial resource strain: Not on file  . Food insecurity:    Worry: Not on file    Inability: Not on file  . Transportation needs:    Medical: Not on file    Non-medical: Not on file  Tobacco Use  . Smoking status: Never Smoker  . Smokeless tobacco: Never Used  Substance and Sexual Activity  . Alcohol use: Yes    Comment: 07/17/2018 "a couple drinks/year; if that"  . Drug use: Never  . Sexual activity: Yes  Lifestyle  . Physical activity:    Days per week: Not on file    Minutes per session: Not on file  . Stress: Not on file  Relationships  . Social connections:    Talks on phone: Not on file    Gets together: Not on file    Attends religious service: Not on file    Active member of club or organization: Not on file    Attends meetings of clubs or organizations: Not on file    Relationship status: Not on file  . Intimate partner violence:    Fear of current or ex partner: Not on file     Emotionally abused: Not on file    Physically abused: Not on file    Forced sexual activity: Not on file  Other Topics Concern  . Not on file  Social History Narrative  . Not on file    Family History  Problem Relation Age of Onset  . Healthy Mother        lived to be 63  . CAD Father 23  . Diabetes Brother   . Hypertension Brother   . CAD  Brother [id number]       CABG    Review of Systems:  As stated in the HPI and otherwise negative.   BP 130/72   Pulse (!) 102   Ht 5\' 9"  (1.753 m)   Wt 169 lb (76.7 kg)   SpO2 97%   BMI 24.96 kg/m   Physical Examination: General: Well developed, well nourished, NAD  HEENT: OP clear, mucus membranes moist  SKIN: warm, dry. No rashes. Neuro: No focal deficits  Musculoskeletal: Muscle strength 5/5 all ext  Psychiatric: Mood and affect normal  Neck: No JVD, no carotid bruits, no thyromegaly, no lymphadenopathy.  Lungs:Clear bilaterally, no wheezes, rhonci, crackles Cardiovascular: Tachy, irregular. No murmurs, gallops or rubs. Abdomen:Soft. Bowel sounds present. Non-tender.  Extremities: No lower extremity edema. Pulses are 2 + in the bilateral DP/PT.  EKG:  EKG is ordered today. The ekg ordered today demonstrates sinus, PVC with bigeminy  Recent Labs: 07/09/2018: Magnesium 2.4 07/10/2018: ALT 31 07/17/2018: B Natriuretic Peptide 53.1 08/03/2018: BUN 16; Potassium 4.1; Sodium 140 08/04/2018: Creatinine, Ser 1.04; Hemoglobin 15.4; Platelets 286   Lipid Panel    Component Value Date/Time   CHOL 108 07/18/2018 0425   TRIG 87 07/18/2018 0425   HDL 32 (L) 07/18/2018 0425   CHOLHDL 3.4 07/18/2018 0425   VLDL 17 07/18/2018 0425   LDLCALC 59 07/18/2018 0425     Wt Readings from Last 3 Encounters:  08/06/18 169 lb (76.7 kg)  08/04/18 169 lb 4.8 oz (76.8 kg)  07/23/18 174 lb 12.8 oz (79.3 kg)     Other studies Reviewed: Additional studies/ records that were reviewed today include: . Review of the above records demonstrates:     Assessment and Plan:   1. CAD s/p CABG with angina: He has severe CAD s/p CABG. He has had several recent caths with placement of stents in the RCA and left main artery into the intermediate branch in July 2019. The LIMA to the LAD is patent and the SVG to the OM is patent. He continues to have some chest pressure but it improves with exertion. Will continue Imdur, ASA, Plavix, Norvasc, statin and beta blocker. I will start Ranexa 500 mg po BID. Will arrange echo to assess LV systolic function.   2. PAD: He is s/p right subclavian artery stent  3. HTN: BP is controlled.   4. HLD: LDL is at goal. Continue statin.   5. PVCs: Will increase Toprol to 50 mg po BID. Echo to assess LV function.   Current medicines are reviewed at length with the patient today.  The patient does not have concerns regarding medicines.  The following changes have been made:  no change  Labs/ tests ordered today include:   Orders Placed This Encounter  Procedures  . EKG 12-Lead  . ECHOCARDIOGRAM COMPLETE     Disposition:   FU with me in 3 months   Signed, Verne Carrowhristopher Anthony Tamburo, MD 08/06/2018 4:29 PM    [facility] [address], [place], [place]  [address] Phone: [phone]; Fax: [phone]

## 2018-08-04 NOTE — Progress Notes (Signed)
Discharge instructions given to patient, all questions answered. He said that he will leave when wife gets here.

## 2018-08-04 NOTE — ED Provider Notes (Signed)
[facility] EMERGENCY DEPARTMENT Provider Note   CSN: [id number] Arrival date & time: 08/03/18  1749     History   Chief Complaint Chief Complaint  Patient presents with  . Chest Pain    HPI Robert [name] is a 77 y.o. male.  Patient presents to the emergency department for evaluation of chest pain.  Patient reports onset of left-sided chest pain which he described as a heaviness while watching TV.  Pain was present for more than 1 hour.  He took several nitroglycerin tablets.  Pain slowly eased off with each tablet and he is now pain-free.  No shortness of breath, nausea, diaphoresis.  He reports that he recently had multiple stents placed and the symptoms he is currently experiencing experienced tonight were similar to his cardiac symptoms.     Past Medical History:  Diagnosis Date  . Arthritis    "a little in my fingers; never tx'd" (07/17/2018)  . CAD (coronary artery disease)    a. CABG X4 2008. b. MI and stent (in [place]) b. PCI/DES to RCA (07/12/18) c. PCI/DES to LM-ramus (07/17/18)  . Foot fracture, right 1996  . High cholesterol   . Hypertension   . Leg hematoma 01/2018   RLE; "no tx"  . Presence of arterial stent 2016   Right subclavian artery  . Skipped heart beats   . Stenosis of subclavian artery (HCC)    a. s/p R subclavian stent 2016.    Patient Active Problem List   Diagnosis Date Noted  . Palpitations   . Unstable angina (HCC)   . Chest pain 07/09/2018  . CAD (coronary artery disease) 05/25/2018  . Essential (primary) hypertension 05/25/2018  . Hyperlipidemia LDL goal <70 05/25/2018    Past Surgical History:  Procedure Laterality Date  . AORTIC ARCH ANGIOGRAPHY N/A 07/10/2018   Procedure: AORTIC ARCH ANGIOGRAPHY;  Surgeon: Marykay Lex, MD;  Location: [facility] INVASIVE CV LAB;  Service: Cardiovascular;  Laterality: N/A;  . CATARACT EXTRACTION W/ INTRAOCULAR LENS  IMPLANT, BILATERAL Bilateral   . CORONARY ANGIOGRAPHY N/A 07/18/2018   Procedure: CORONARY ANGIOGRAPHY;  Surgeon: Marykay Lex, MD;  Location: [facility] INVASIVE CV LAB;  Service: Cardiovascular;  Laterality: N/A;  . CORONARY ANGIOPLASTY WITH STENT PLACEMENT  2016  . CORONARY ARTERY BYPASS GRAFT  2008   "CABG X4"  . CORONARY ATHERECTOMY N/A 07/18/2018   Procedure: CORONARY ATHERECTOMY;  Surgeon: Marykay Lex, MD;  Location: [facility] INVASIVE CV LAB;  Service: Cardiovascular;  Laterality: N/A;  . CORONARY STENT INTERVENTION N/A 07/12/2018   Procedure: CORONARY STENT INTERVENTION;  Surgeon: Marykay Lex, MD;  Location: [facility] INVASIVE CV LAB;  Service: Cardiovascular;  Laterality: N/A;  . LEFT HEART CATH AND CORS/GRAFTS ANGIOGRAPHY N/A 07/10/2018   Procedure: LEFT HEART CATH AND CORS/GRAFTS ANGIOGRAPHY;  Surgeon: Marykay Lex, MD;  Location: [facility] INVASIVE CV LAB;  Service: Cardiovascular;  Laterality: N/A;  . PROSTATECTOMY  ~ 2012   "removed most of it; no cancer" (07/17/2018)  . SUBCLAVIAN ARTERY STENT Right 2016        Home Medications    Prior to Admission medications   Medication Sig Start Date End Date Taking? Authorizing Provider  amLODipine (NORVASC) 10 MG tablet Take 10 mg by mouth daily.   Yes [provider]  Artificial Tear Ointment (DRY EYES OP) Place 1 drop into both eyes as needed (dry eyes).   Yes [provider]  aspirin 81 MG EC tablet Take 81 mg by mouth at bedtime.  Swallow whole.    Yes [provider]  atorvastatin (LIPITOR) 80 MG tablet Take 1 tablet (80 mg total) by mouth daily at 6 PM. Patient taking differently: Take 80 mg by mouth at bedtime.  07/19/18  Yes Arty Baumgartneroberts, Lindsay B, NP  clopidogrel (PLAVIX) 75 MG tablet Take 1 tablet (75 mg total) by mouth daily. 07/19/18  Yes Arty Baumgartneroberts, Lindsay B, NP  Ergocalciferol (VITAMIN D2) 2000 units TABS Take 2,000 Units by mouth at bedtime.   Yes [provider]  isosorbide mononitrate (IMDUR) 30 MG 24 hr tablet Take 1 tablet (30 mg total) by mouth daily. 07/23/18 10/21/18 Yes  Rosalio MacadamiaGerhardt, Lori C, NP  metoprolol succinate (TOPROL-XL) 50 MG 24 hr tablet Take 1 tablet (50 mg total) by mouth daily. Take with or immediately following a meal. 07/19/18  Yes Laverda Pageoberts, Lindsay B, NP  nitroGLYCERIN (NITROSTAT) 0.4 MG SL tablet Place 0.4 mg under the tongue every 5 (five) minutes as needed for chest pain.  03/28/18  Yes [provider]  ramipril (ALTACE) 10 MG capsule Take 10 mg by mouth at bedtime.    Yes [provider]  ranitidine (ZANTAC) 150 MG tablet Take 150 mg by mouth 2 (two) times daily.   Yes [provider]    Family History Family History  Problem Relation Age of Onset  . Healthy Mother        lived to be 5299  . CAD Father 4748  . Diabetes Brother   . Hypertension Brother   . CAD Brother [id number]       CABG    Social History Social History   Tobacco Use  . Smoking status: Never Smoker  . Smokeless tobacco: Never Used  Substance Use Topics  . Alcohol use: Yes    Comment: 07/17/2018 "a couple drinks/year; if that"  . Drug use: Never     Allergies   Patient has no known allergies.   Review of Systems Review of Systems  Cardiovascular: Positive for chest pain.  All other systems reviewed and are negative.    Physical Exam Updated Vital Signs BP (!) 127/103 (BP Location: Left Arm)   Pulse 86   Temp 98.2 F (36.8 C) (Oral)   Resp 18   Ht 5\' 9"  (1.753 m)   Wt 79.3 kg   SpO2 99%   BMI 25.82 kg/m   Physical Exam  Constitutional: He is oriented to person, place, and time. He appears well-developed and well-nourished. No distress.  HENT:  Head: Normocephalic and atraumatic.  Right Ear: Hearing normal.  Left Ear: Hearing normal.  Nose: Nose normal.  Mouth/Throat: Oropharynx is clear and moist and mucous membranes are normal.  Eyes: Pupils are equal, round, and reactive to light. Conjunctivae and EOM are normal.  Neck: Normal range of motion. Neck supple.  Cardiovascular: Regular rhythm, S1 normal and S2 normal. Exam  reveals no gallop and no friction rub.  No murmur heard. Pulmonary/Chest: Effort normal and breath sounds normal. No respiratory distress. He exhibits no tenderness.  Abdominal: Soft. Normal appearance and bowel sounds are normal. There is no hepatosplenomegaly. There is no tenderness. There is no rebound, no guarding, no tenderness at McBurney's point and negative Murphy's sign. No hernia.  Musculoskeletal: Normal range of motion.  Neurological: He is alert and oriented to person, place, and time. He has normal strength. No cranial nerve deficit or sensory deficit. Coordination normal. GCS eye subscore is 4. GCS verbal subscore is 5. GCS motor subscore is 6.  Skin: Skin  is warm, dry and intact. No rash noted. No cyanosis.  Psychiatric: He has a normal mood and affect. His speech is normal and behavior is normal. Thought content normal.  Nursing note and vitals reviewed.    ED Treatments / Results  Labs (all labs ordered are listed, but only abnormal results are displayed) Labs Reviewed  BASIC METABOLIC PANEL - Abnormal; Notable for the following components:      Result Value   CO2 19 (*)    Glucose, Bld 110 (*)    GFR calc non Af Amer 58 (*)    All other components within normal limits  CBC - Abnormal; Notable for the following components:   WBC 11.0 (*)    All other components within normal limits  I-STAT TROPONIN, ED    EKG EKG Interpretation  Date/Time:  Friday August 03 2018 17:59:56 EDT Ventricular Rate:  79 PR Interval:  142 QRS Duration: 78 QT Interval:  382 QTC Calculation: 438 R Axis:   6 Text Interpretation:  Normal sinus rhythm Cannot rule out Anterior infarct , age undetermined Abnormal ECG Confirmed by Gilda Creaseollina, Christopher J [phone]([id number]) on 08/04/2018 12:50:34 AM   Radiology Dg Chest 2 View  Result Date: 08/03/2018 CLINICAL DATA:  Patient with chest pain. EXAM: CHEST - 2 VIEW COMPARISON:  Chest radiograph 07/17/2018. FINDINGS: Patient is mildly rotated to the left.  Stable cardiac and mediastinal contours status post median sternotomy. Mild elevation left hemidiaphragm. Left basilar atelectasis/scarring. No large area pulmonary consolidation. No pleural effusion or pneumothorax. Regional skeleton is unremarkable. IMPRESSION: No acute cardiopulmonary process. Left basilar atelectasis/scarring. Electronically Signed   By: Annia Beltrew  Davis M.D.   On: 08/03/2018 19:11    Procedures Procedures (including critical care time)  Medications Ordered in ED Medications - No data to display   Initial Impression / Assessment and Plan / ED Course  I have reviewed the triage vital signs and the nursing notes.  Pertinent labs & imaging results that were available during my care of the patient were reviewed by me and considered in my medical decision making (see chart for details).     Presents to the emergency department for evaluation of chest pain.  Patient with severe coronary artery disease.  He was hospitalized for an extended period of time at the end of July and received multiple cardiac catheterizations with interventions.  He reports that he has had occasional chest pains since leaving the hospital.  Family reports that he was experiencing quite a bit of pain tonight and looked ill when he was having the pain.  He took nitroglycerin with resolution.  He is currently pain-free.  EKG does not show any evidence of ischemia or acute infarct.  Troponin negative.  Based on his recent cardiac history, recommend observation for cardiac rule out.  Final Clinical Impressions(s) / ED Diagnoses   Final diagnoses:  Chest pain, unspecified type    ED Discharge Orders    None       Gilda CreasePollina, Christopher J, MD 08/04/18 [phone]

## 2018-08-06 ENCOUNTER — Encounter: Payer: Self-pay | Admitting: Cardiovascular Disease

## 2018-08-06 ENCOUNTER — Ambulatory Visit (INDEPENDENT_AMBULATORY_CARE_PROVIDER_SITE_OTHER): Payer: Medicare Other | Admitting: Cardiovascular Disease

## 2018-08-06 VITALS — BP 130/72 | HR 102 | Ht 69.0 in | Wt 169.0 lb

## 2018-08-06 DIAGNOSIS — I10 Essential (primary) hypertension: Secondary | ICD-10-CM | POA: Diagnosis not present

## 2018-08-06 DIAGNOSIS — E785 Hyperlipidemia, unspecified: Secondary | ICD-10-CM | POA: Diagnosis not present

## 2018-08-06 DIAGNOSIS — I739 Peripheral vascular disease, unspecified: Secondary | ICD-10-CM

## 2018-08-06 DIAGNOSIS — I200 Unstable angina: Secondary | ICD-10-CM | POA: Diagnosis not present

## 2018-08-06 DIAGNOSIS — I493 Ventricular premature depolarization: Secondary | ICD-10-CM

## 2018-08-06 DIAGNOSIS — I25118 Atherosclerotic heart disease of native coronary artery with other forms of angina pectoris: Secondary | ICD-10-CM | POA: Diagnosis not present

## 2018-08-06 MED ORDER — METOPROLOL SUCCINATE ER 50 MG PO TB24: 50.0000 mg | ORAL_TABLET | Freq: Two times a day (BID) | ORAL | 3 refills | Status: DC

## 2018-08-06 MED ORDER — RANOLAZINE ER 500 MG PO TB12: 500.0000 mg | ORAL_TABLET | Freq: Two times a day (BID) | ORAL | 6 refills | Status: DC

## 2018-08-06 NOTE — Patient Instructions (Signed)
Medication Instructions:  Your physician has recommended you make the following change in your medication:  Increase Toprol to 50 mg by mouth twice daily. Start Ranexa 500 mg by mouth twice daily.   Labwork: none  Testing/Procedures: Your physician has requested that you have an echocardiogram. Echocardiography is a painless test that uses sound waves to create images of your heart. It provides your doctor with information about the size and shape of your heart and how well your heart's chambers and valves are working. This procedure takes approximately one hour. There are no restrictions for this procedure.    Follow-Up: Your physician recommends that you schedule a follow-up appointment in: about 6 weeks.  Scheduled for October 2,2019 at 10:40    Any Other Special Instructions Will Be Listed Below (If Applicable).     If you need a refill on your cardiac medications before your next appointment, please call your pharmacy.

## 2018-08-08 ENCOUNTER — Telehealth (HOSPITAL_COMMUNITY): Payer: Self-pay

## 2018-08-08 NOTE — Telephone Encounter (Signed)
Attempted to contact patient in regards to Cardiac Rehab - lm on vm °

## 2018-08-10 ENCOUNTER — Other Ambulatory Visit: Payer: Self-pay

## 2018-08-10 ENCOUNTER — Other Ambulatory Visit: Payer: Self-pay | Admitting: *Deleted

## 2018-08-10 ENCOUNTER — Ambulatory Visit (HOSPITAL_COMMUNITY): Payer: Medicare Other | Attending: Cardiology

## 2018-08-10 DIAGNOSIS — I10 Essential (primary) hypertension: Secondary | ICD-10-CM | POA: Diagnosis not present

## 2018-08-10 DIAGNOSIS — I071 Rheumatic tricuspid insufficiency: Secondary | ICD-10-CM | POA: Insufficient documentation

## 2018-08-10 DIAGNOSIS — I493 Ventricular premature depolarization: Secondary | ICD-10-CM | POA: Diagnosis not present

## 2018-08-10 DIAGNOSIS — I25118 Atherosclerotic heart disease of native coronary artery with other forms of angina pectoris: Secondary | ICD-10-CM | POA: Diagnosis not present

## 2018-08-10 MED ORDER — ISOSORBIDE MONONITRATE ER 60 MG PO TB24: 60.0000 mg | ORAL_TABLET | Freq: Every day | ORAL | 3 refills | Status: DC

## 2018-08-10 MED ORDER — RANOLAZINE ER 500 MG PO TB12: 500.0000 mg | ORAL_TABLET | Freq: Two times a day (BID) | ORAL | 3 refills | Status: DC

## 2018-08-15 ENCOUNTER — Telehealth (HOSPITAL_COMMUNITY): Payer: Self-pay

## 2018-08-15 NOTE — Telephone Encounter (Signed)
Called to follow up with patient in regards to cardiac rehab - patient stated he has a lot going on and will call when and if he is ready. Closed referral.

## 2018-08-16 DIAGNOSIS — I2510 Atherosclerotic heart disease of native coronary artery without angina pectoris: Secondary | ICD-10-CM | POA: Diagnosis not present

## 2018-08-16 DIAGNOSIS — I208 Other forms of angina pectoris: Secondary | ICD-10-CM | POA: Diagnosis not present

## 2018-08-16 DIAGNOSIS — R079 Chest pain, unspecified: Secondary | ICD-10-CM | POA: Diagnosis not present

## 2018-09-03 ENCOUNTER — Other Ambulatory Visit: Payer: Self-pay | Admitting: Cardiovascular Disease

## 2018-09-18 NOTE — Progress Notes (Signed)
Chief Complaint  Patient presents with  . Follow-up    CAD   History of Present Illness: 77 yo male with history of CAD s/p CABG in 2008, HTN, HLD and subclavian artery stenosis who is here today for cardiac follow up. His CABG was in 2008 in [place]. Prior right subclavian artery stent in 2016. He underwent a nuclear stress in June 2019 which showed a fixed inferior defect and no ischemia. He began having chest pain in July 2019 and underwent a cardiac cath on 07/10/18 which showed 90% distal left main stenosis, occluded proximal LAD, 90% ostial Circumflex stenosis, 60% in stent restenosis RCA and severe distal RCA stenosis. LIMA to LAD patent. SVG to OM was patent. SVG to RCA and intermediate occluded. PCI of the RCA was performed with placement of 3 drug eluting stents. He then had staged PCI of the distal left main artery into the intermediate vessel with atherectomy and placement of 2 drug eluting stents. He was seen in our office 07/23/18 and had c/o continued chest pain. Admitted to [facility] 08/03/18 with chest pain and ruled out for MI. Imdur increased and he was discharged home on 08/04/18. I saw him in the office 08/06/18 and his pain had improved but he was still having mild pain. Ranexa was started but he did not tolerate. Echo 08/10/18 with LVEF=60-65%. Grade 1 diastolic dysfunction. There was mild mitral regurgitation.   He is here today for follow up. The patient denies any chest pain, dyspnea, palpitations, lower extremity edema, orthopnea, PND, dizziness, near syncope or syncope. Blood pressure has been well controlled at home. (120s systolic) and heart rate is 70s at home.   Primary Care Physician: Georgann Housekeeper, MD  Past Medical History:  Diagnosis Date  . Arthritis    "a little in my fingers; never tx'd" (07/17/2018)  . CAD (coronary artery disease)    a. CABG X4 2008. b. MI and stent (in [place]) b. PCI/DES to RCA (07/12/18) c. PCI/DES to LM-ramus (07/17/18)  . Foot fracture, right 1996    . High cholesterol   . Hypertension   . Leg hematoma 01/2018   RLE; "no tx"  . Presence of arterial stent 2016   Right subclavian artery  . Skipped heart beats   . Stenosis of subclavian artery (HCC)    a. s/p R subclavian stent 2016.    Past Surgical History:  Procedure Laterality Date  . AORTIC ARCH ANGIOGRAPHY N/A 07/10/2018   Procedure: AORTIC ARCH ANGIOGRAPHY;  Surgeon: Marykay Lex, MD;  Location: [facility] INVASIVE CV LAB;  Service: Cardiovascular;  Laterality: N/A;  . CATARACT EXTRACTION W/ INTRAOCULAR LENS  IMPLANT, BILATERAL Bilateral   . CORONARY ANGIOGRAPHY N/A 07/18/2018   Procedure: CORONARY ANGIOGRAPHY;  Surgeon: Marykay Lex, MD;  Location: [facility] INVASIVE CV LAB;  Service: Cardiovascular;  Laterality: N/A;  . CORONARY ANGIOPLASTY WITH STENT PLACEMENT  2016  . CORONARY ARTERY BYPASS GRAFT  2008   "CABG X4"  . CORONARY ATHERECTOMY N/A 07/18/2018   Procedure: CORONARY ATHERECTOMY;  Surgeon: Marykay Lex, MD;  Location: [facility] INVASIVE CV LAB;  Service: Cardiovascular;  Laterality: N/A;  . CORONARY STENT INTERVENTION N/A 07/12/2018   Procedure: CORONARY STENT INTERVENTION;  Surgeon: Marykay Lex, MD;  Location: [facility] INVASIVE CV LAB;  Service: Cardiovascular;  Laterality: N/A;  . LEFT HEART CATH AND CORS/GRAFTS ANGIOGRAPHY N/A 07/10/2018   Procedure: LEFT HEART CATH AND CORS/GRAFTS ANGIOGRAPHY;  Surgeon: Marykay Lex, MD;  Location: [facility] INVASIVE CV LAB;  Service: Cardiovascular;  Laterality: N/A;  . PROSTATECTOMY  ~ 2012   "removed most of it; no cancer" (07/17/2018)  . SUBCLAVIAN ARTERY STENT Right 2016    Current Outpatient Medications  Medication Sig Dispense Refill  . amLODipine (NORVASC) 10 MG tablet Take 1 tablet (10 mg total) by mouth daily. 90 tablet 3  . Artificial Tear Ointment (DRY EYES OP) Place 1 drop into both eyes as needed (dry eyes).    Marland Kitchen aspirin 81 MG EC tablet Take 81 mg by mouth at bedtime. Swallow whole.     Marland Kitchen atorvastatin (LIPITOR) 80 MG tablet  Take 1 tablet (80 mg total) by mouth at bedtime. 90 tablet 3  . clopidogrel (PLAVIX) 75 MG tablet Take 1 tablet (75 mg total) by mouth daily. 90 tablet 3  . Ergocalciferol (VITAMIN D2) 2000 units TABS Take 2,000 Units by mouth at bedtime.    . isosorbide mononitrate (IMDUR) 60 MG 24 hr tablet TAKE 1 TABLET BY MOUTH EVERY DAY 90 tablet 3  . metoprolol succinate (TOPROL-XL) 50 MG 24 hr tablet Take 1 tablet (50 mg total) by mouth 2 (two) times daily. Take with or immediately following a meal. 180 tablet 3  . nitroGLYCERIN (NITROSTAT) 0.4 MG SL tablet Place 0.4 mg under the tongue every 5 (five) minutes as needed for chest pain.   1  . ramipril (ALTACE) 10 MG capsule Take 1 capsule (10 mg total) by mouth daily. 90 capsule 3  . ranitidine (ZANTAC) 150 MG tablet Take 150 mg by mouth 2 (two) times daily.     No current facility-administered medications for this visit.     No Known Allergies  Social History   Socioeconomic History  . Marital status: Married    Spouse name: Not on file  . Number of children: 2  . Years of education: COLLEGE  . Highest education level: Not on file  Occupational History  . Occupation: RETIRED  Social Needs  . Financial resource strain: Not on file  . Food insecurity:    Worry: Not on file    Inability: Not on file  . Transportation needs:    Medical: Not on file    Non-medical: Not on file  Tobacco Use  . Smoking status: Never Smoker  . Smokeless tobacco: Never Used  Substance and Sexual Activity  . Alcohol use: Yes    Comment: 07/17/2018 "a couple drinks/year; if that"  . Drug use: Never  . Sexual activity: Yes  Lifestyle  . Physical activity:    Days per week: Not on file    Minutes per session: Not on file  . Stress: Not on file  Relationships  . Social connections:    Talks on phone: Not on file    Gets together: Not on file    Attends religious service: Not on file    Active member of club or organization: Not on file    Attends meetings  of clubs or organizations: Not on file    Relationship status: Not on file  . Intimate partner violence:    Fear of current or ex partner: Not on file    Emotionally abused: Not on file    Physically abused: Not on file    Forced sexual activity: Not on file  Other Topics Concern  . Not on file  Social History Narrative  . Not on file    Family History  Problem Relation Age of Onset  . Healthy Mother  lived to be 99  . CAD Father 61  . Diabetes Brother   . Hypertension Brother   . CAD Brother 34       CABG    Review of Systems:  As stated in the HPI and otherwise negative.   BP (!) 150/88   Pulse 97   Ht 5\' 9"  (1.753 m)   Wt 175 lb 6.4 oz (79.6 kg)   SpO2 96%   BMI 25.90 kg/m   Physical Examination:  General: Well developed, well nourished, NAD  HEENT: OP clear, mucus membranes moist  SKIN: warm, dry. No rashes. Neuro: No focal deficits  Musculoskeletal: Muscle strength 5/5 all ext  Psychiatric: Mood and affect normal  Neck: No JVD, no carotid bruits, no thyromegaly, no lymphadenopathy.  Lungs:Clear bilaterally, no wheezes, rhonci, crackles Cardiovascular: Regular rate and rhythm. No murmurs, gallops or rubs. Abdomen:Soft. Bowel sounds present. Non-tender.  Extremities: No lower extremity edema. Pulses are 2 + in the bilateral DP/PT.  EKG:  EKG is not ordered today. The ekg ordered today demonstrates   Recent Labs: 07/09/2018: Magnesium 2.4 07/10/2018: ALT 31 07/17/2018: B Natriuretic Peptide 53.1 08/03/2018: BUN 16; Potassium 4.1; Sodium 140 08/04/2018: Creatinine, Ser 1.04; Hemoglobin 15.4; Platelets 286   Lipid Panel    Component Value Date/Time   CHOL 108 07/18/2018 0425   TRIG 87 07/18/2018 0425   HDL 32 (L) 07/18/2018 0425   CHOLHDL 3.4 07/18/2018 0425   VLDL 17 07/18/2018 0425   LDLCALC 59 07/18/2018 0425     Wt Readings from Last 3 Encounters:  09/19/18 175 lb 6.4 oz (79.6 kg)  08/06/18 169 lb (76.7 kg)  08/04/18 169 lb 4.8 oz (76.8 kg)      Other studies Reviewed: Additional studies/ records that were reviewed today include: . Review of the above records demonstrates:    Assessment and Plan:   1. CAD s/p CABG with angina: He has severe CAD s/p CABG. He has had several recent caths with placement of stents in the RCA and left main artery into the intermediate branch in July 2019. The LIMA to the LAD is patent and the SVG to the OM is patent. Echo August 2019 with normal LV systolic function. He has had complete resolution of his chest pain. He did not tolerate Ranexa. Will continue Imdur, ASA, Plavix, statin and Norvasc.    2. PAD: He is s/p right subclavian artery stent  3. HTN: His BP is controlled at home in the 120s. No changes today  4. HLD: Lipids are well controlled. Continue statin.    5. PVCs: Rare palpitations. Continue beta blocker.   Current medicines are reviewed at length with the patient today.  The patient does not have concerns regarding medicines.  The following changes have been made:  no change  Labs/ tests ordered today include:   Orders Placed This Encounter  Procedures  . Flu Vaccine QUAD 36+ mos IM     Disposition:   FU with me in  6 months   Signed, Verne Carrow, MD 09/19/2018 10:49 AM    [facility] [address] [place], [place], [place]  [address] Phone: [phone]; Fax: [phone]

## 2018-09-19 ENCOUNTER — Ambulatory Visit (INDEPENDENT_AMBULATORY_CARE_PROVIDER_SITE_OTHER): Payer: Medicare Other | Admitting: Cardiovascular Disease

## 2018-09-19 ENCOUNTER — Encounter: Payer: Self-pay | Admitting: Cardiovascular Disease

## 2018-09-19 VITALS — BP 150/88 | HR 97 | Ht 69.0 in | Wt 175.4 lb

## 2018-09-19 DIAGNOSIS — Z23 Encounter for immunization: Secondary | ICD-10-CM | POA: Diagnosis not present

## 2018-09-19 DIAGNOSIS — I25118 Atherosclerotic heart disease of native coronary artery with other forms of angina pectoris: Secondary | ICD-10-CM | POA: Diagnosis not present

## 2018-09-19 DIAGNOSIS — E785 Hyperlipidemia, unspecified: Secondary | ICD-10-CM

## 2018-09-19 DIAGNOSIS — I200 Unstable angina: Secondary | ICD-10-CM

## 2018-09-19 DIAGNOSIS — I739 Peripheral vascular disease, unspecified: Secondary | ICD-10-CM

## 2018-09-19 DIAGNOSIS — I493 Ventricular premature depolarization: Secondary | ICD-10-CM | POA: Diagnosis not present

## 2018-09-19 DIAGNOSIS — I10 Essential (primary) hypertension: Secondary | ICD-10-CM | POA: Diagnosis not present

## 2018-09-19 MED ORDER — ATORVASTATIN CALCIUM 80 MG PO TABS: 80.0000 mg | ORAL_TABLET | Freq: Every day | ORAL | 3 refills | Status: DC

## 2018-09-19 MED ORDER — CLOPIDOGREL BISULFATE 75 MG PO TABS: 75.0000 mg | ORAL_TABLET | Freq: Every day | ORAL | 3 refills | Status: DC

## 2018-09-19 MED ORDER — METOPROLOL SUCCINATE ER 50 MG PO TB24: 50.0000 mg | ORAL_TABLET | Freq: Two times a day (BID) | ORAL | 3 refills | Status: DC

## 2018-09-19 MED ORDER — AMLODIPINE BESYLATE 10 MG PO TABS: 10.0000 mg | ORAL_TABLET | Freq: Every day | ORAL | 3 refills | Status: DC

## 2018-09-19 MED ORDER — RAMIPRIL 10 MG PO CAPS: 10.0000 mg | ORAL_CAPSULE | Freq: Every day | ORAL | 3 refills | Status: DC

## 2018-09-19 NOTE — Patient Instructions (Signed)

## 2018-11-21 ENCOUNTER — Ambulatory Visit: Payer: Medicare Other | Admitting: Cardiovascular Disease

## 2018-11-22 DIAGNOSIS — I10 Essential (primary) hypertension: Secondary | ICD-10-CM | POA: Diagnosis not present

## 2018-11-22 DIAGNOSIS — I700 Atherosclerosis of aorta: Secondary | ICD-10-CM | POA: Diagnosis not present

## 2018-11-22 DIAGNOSIS — I2510 Atherosclerotic heart disease of native coronary artery without angina pectoris: Secondary | ICD-10-CM | POA: Diagnosis not present

## 2018-11-22 DIAGNOSIS — E7800 Pure hypercholesterolemia, unspecified: Secondary | ICD-10-CM | POA: Diagnosis not present

## 2018-11-22 DIAGNOSIS — Z951 Presence of aortocoronary bypass graft: Secondary | ICD-10-CM | POA: Diagnosis not present

## 2018-11-22 DIAGNOSIS — I739 Peripheral vascular disease, unspecified: Secondary | ICD-10-CM | POA: Diagnosis not present

## 2018-11-22 DIAGNOSIS — K219 Gastro-esophageal reflux disease without esophagitis: Secondary | ICD-10-CM | POA: Diagnosis not present

## 2018-12-27 DIAGNOSIS — H524 Presbyopia: Secondary | ICD-10-CM | POA: Diagnosis not present

## 2018-12-27 DIAGNOSIS — Z961 Presence of intraocular lens: Secondary | ICD-10-CM | POA: Diagnosis not present

## 2018-12-27 DIAGNOSIS — H16122 Filamentary keratitis, left eye: Secondary | ICD-10-CM | POA: Diagnosis not present

## 2019-01-01 DIAGNOSIS — H40013 Open angle with borderline findings, low risk, bilateral: Secondary | ICD-10-CM | POA: Diagnosis not present

## 2019-01-01 DIAGNOSIS — Z961 Presence of intraocular lens: Secondary | ICD-10-CM | POA: Diagnosis not present

## 2019-01-01 DIAGNOSIS — H1789 Other corneal scars and opacities: Secondary | ICD-10-CM | POA: Diagnosis not present

## 2019-04-08 ENCOUNTER — Ambulatory Visit: Payer: Medicare Other | Admitting: Cardiovascular Disease

## 2019-05-15 IMAGING — CR DG CHEST 2V
2 series · 2 of 2 positions shown · non-contrast
Comparison: July 05, 2018

CLINICAL DATA: Chest pain and shortness of breath

EXAM:
CHEST - 2 VIEW

[chest pa]
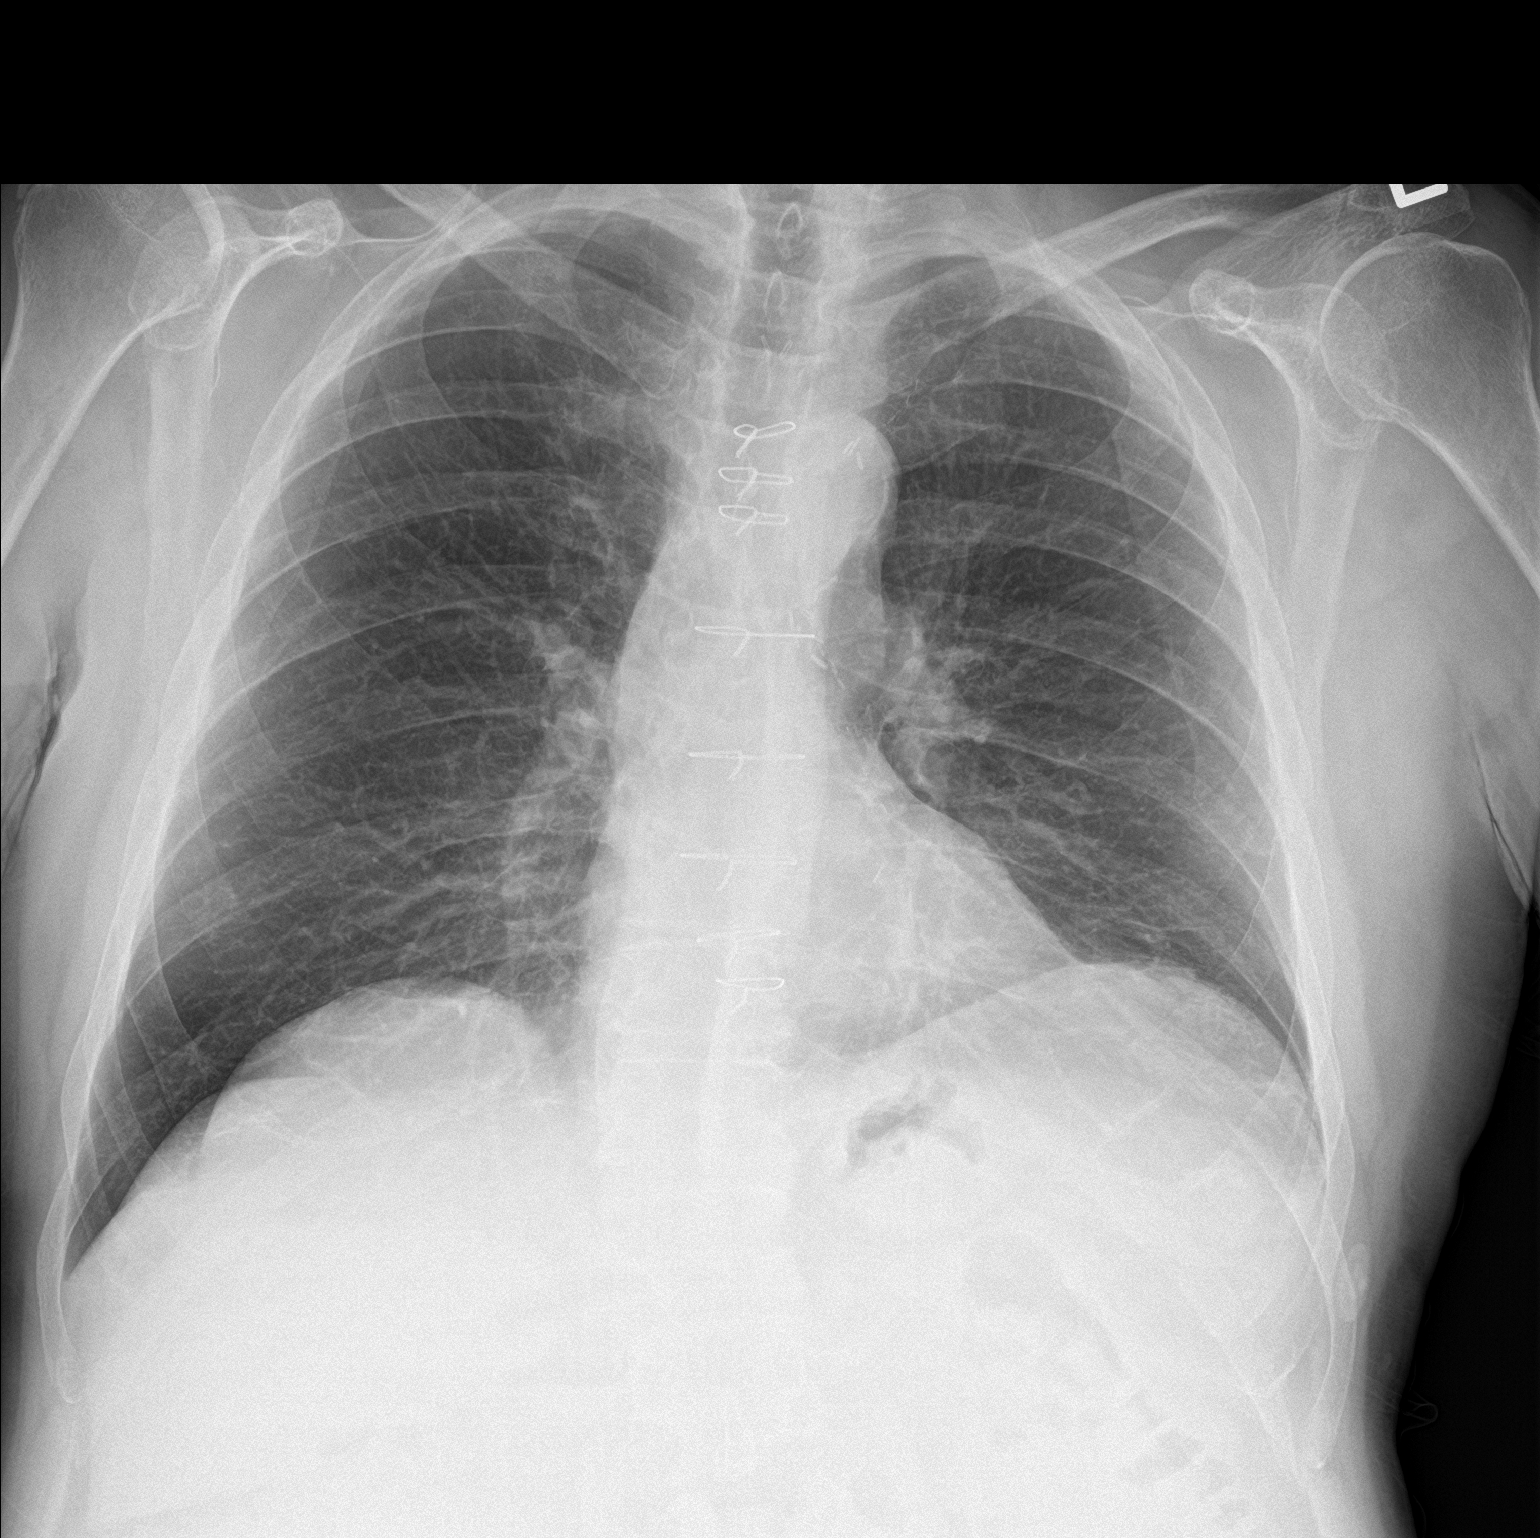

[chest lat]
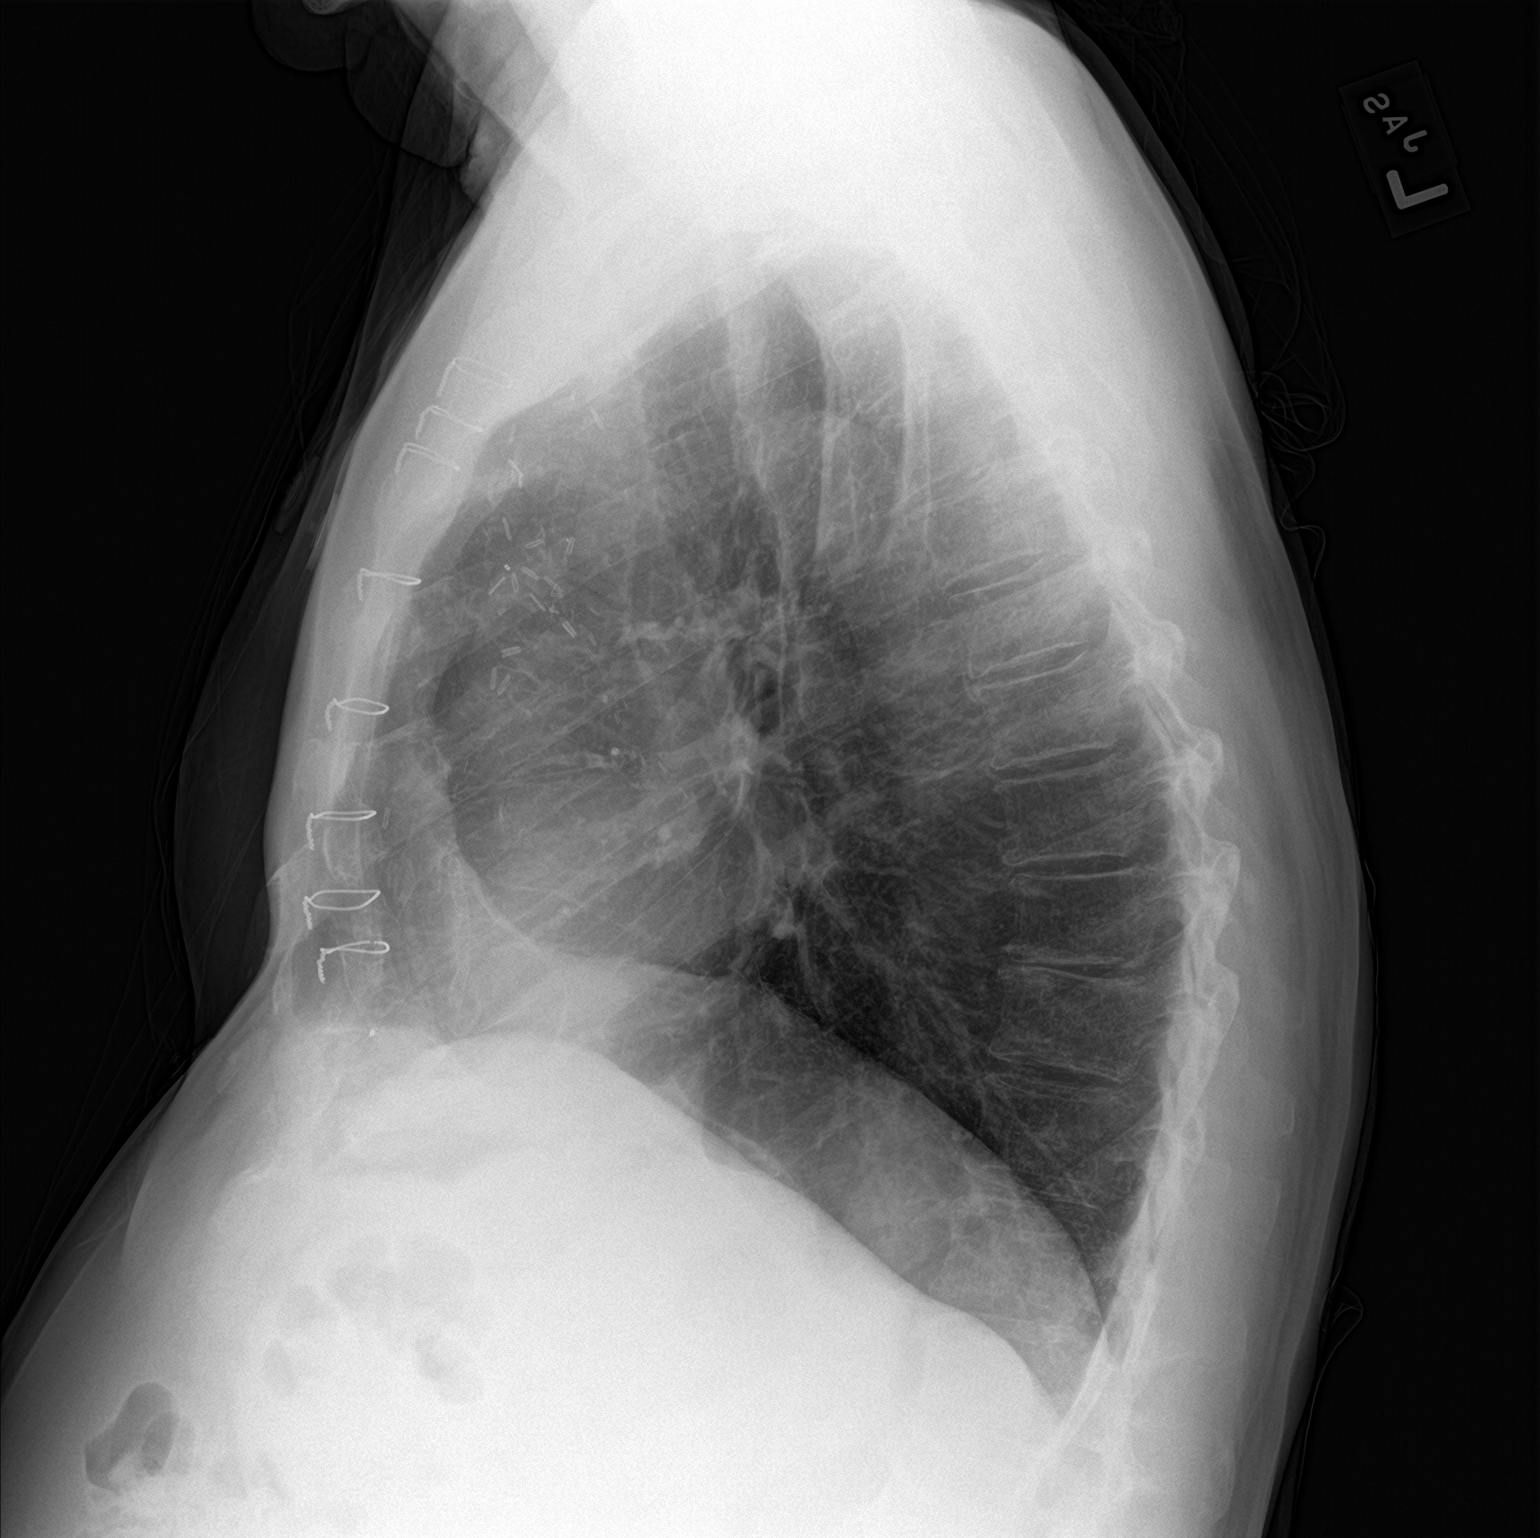

[2 of 2 positions shown; findings below may reference images not displayed]

FINDINGS: There is no appreciable edema or consolidation. Heart size and
pulmonary vascularity are normal. No adenopathy. Patient is status
post internal mammary bypass grafting. There is aortic
atherosclerosis. No evident bone lesions.
IMPRESSION: Postoperative changes. Aortic atherosclerosis. No edema or
consolidation.

Aortic Atherosclerosis ([id number].D).

## 2019-06-10 IMAGING — CR DG CHEST 2V
2 series · 2 of 2 positions shown · non-contrast
Comparison: Chest radiograph 07/17/2018.

CLINICAL DATA: Patient with chest pain.

EXAM:
CHEST - 2 VIEW

[chest pa]
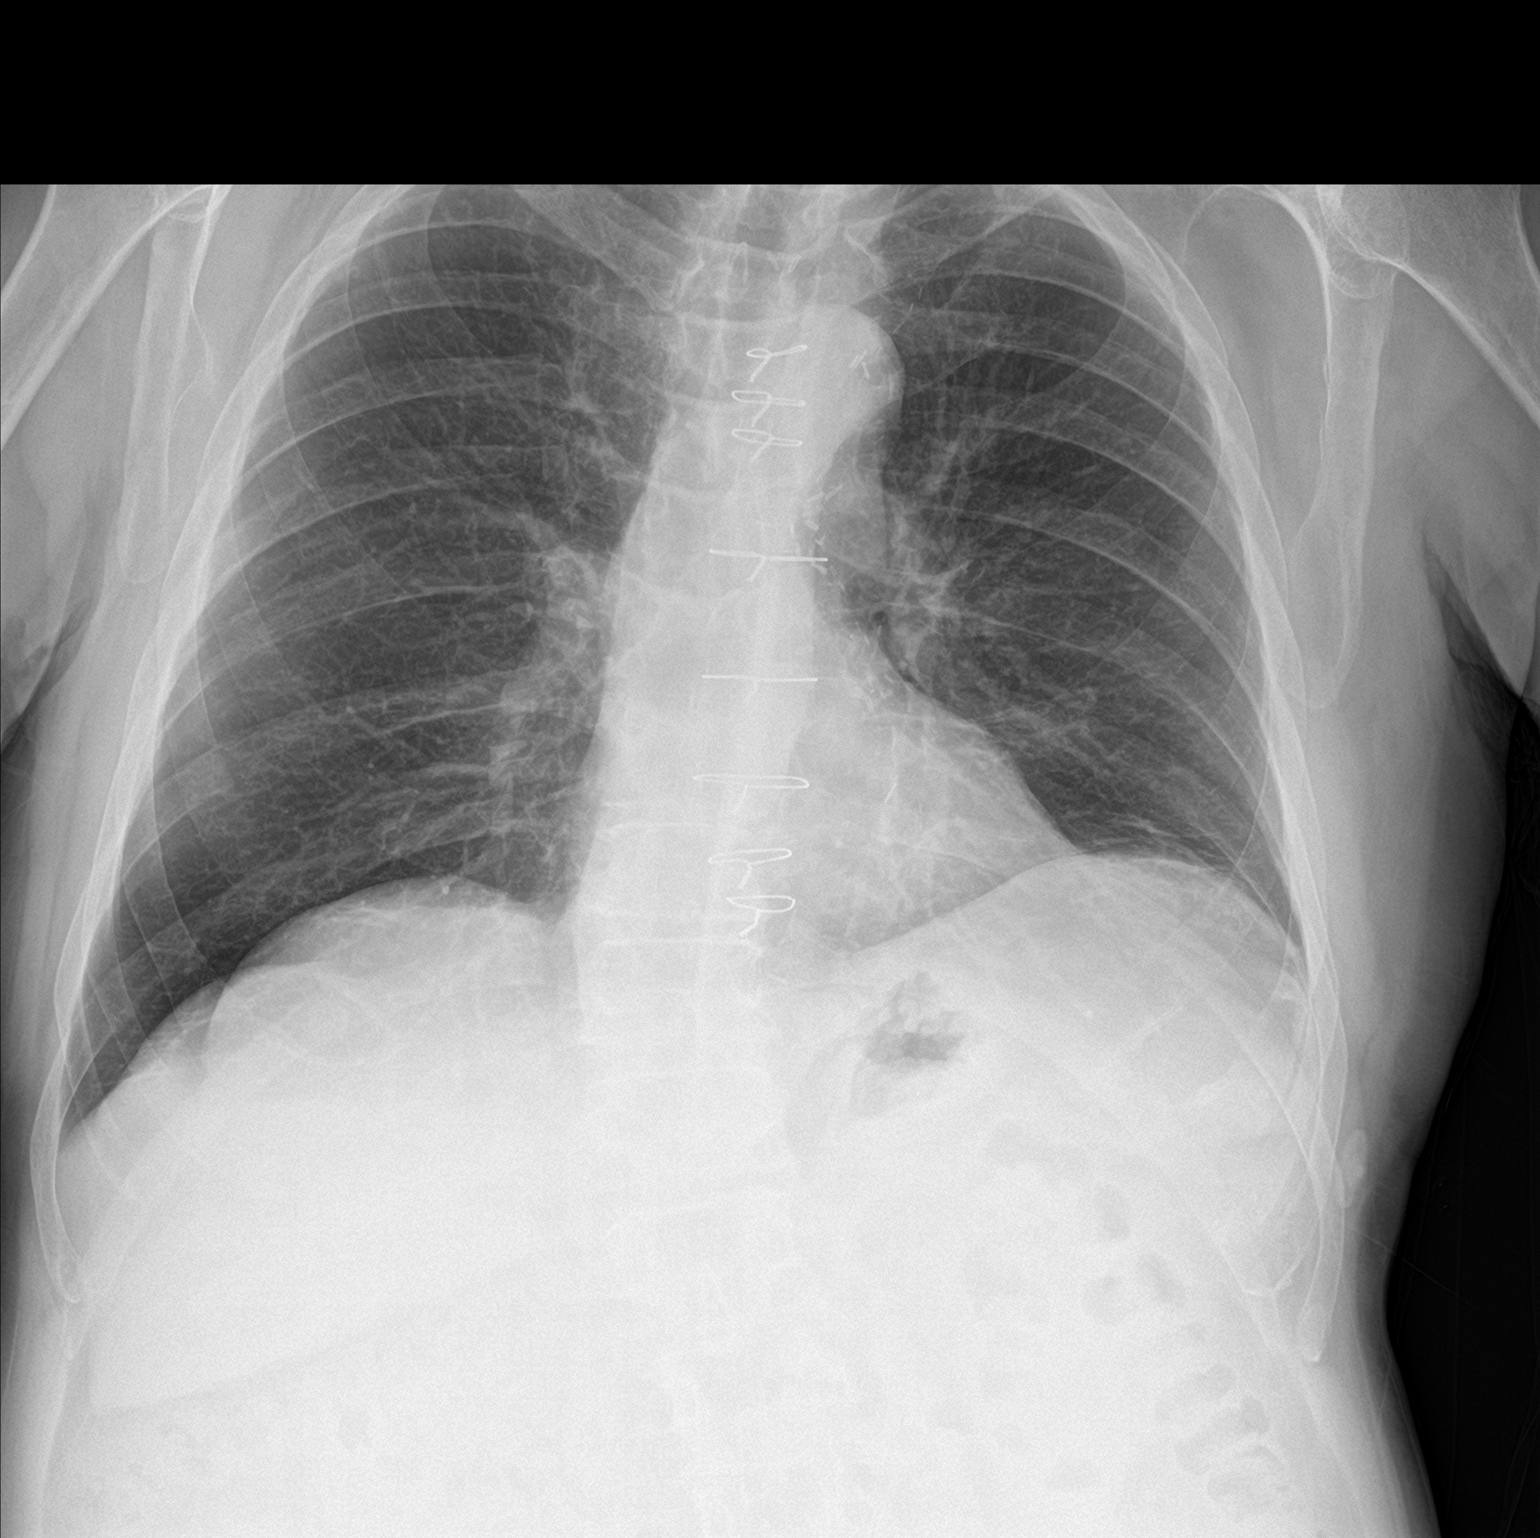

[chest lat]
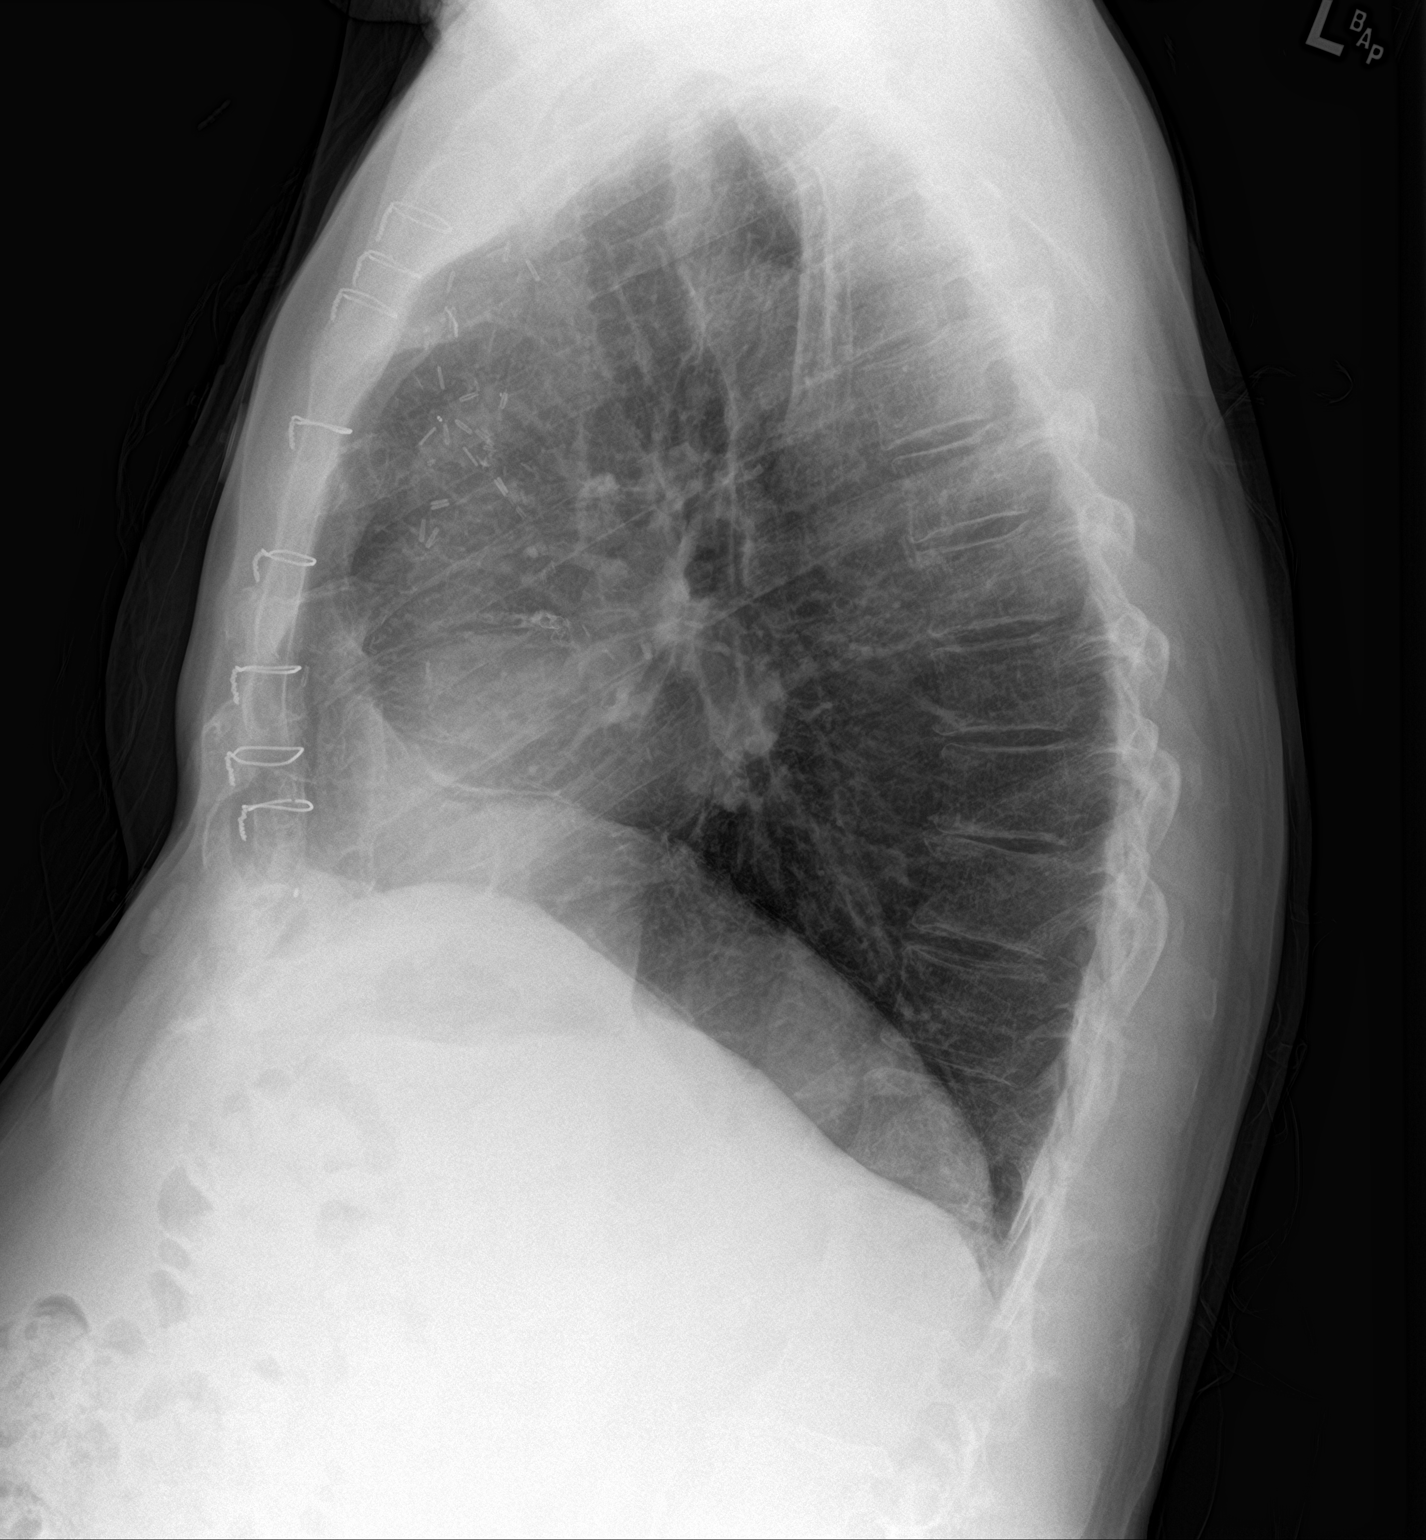

[2 of 2 positions shown; findings below may reference images not displayed]

FINDINGS: Patient is mildly rotated to the left. Stable cardiac and
mediastinal contours status post median sternotomy. Mild elevation
left hemidiaphragm. Left basilar atelectasis/scarring. No large area
pulmonary consolidation. No pleural effusion or pneumothorax.
Regional skeleton is unremarkable.
IMPRESSION: No acute cardiopulmonary process. Left basilar atelectasis/scarring.

## 2019-06-19 ENCOUNTER — Telehealth: Payer: Self-pay | Admitting: Cardiovascular Disease

## 2019-06-19 NOTE — Progress Notes (Signed)
Chief Complaint  Patient presents with  . Follow-up    CAD   History of Present Illness: 78 yo male with history of CAD s/p CABG in 2008, HTN, HLD and subclavian artery stenosis who is here today for cardiac follow up. His CABG was in 2008 in [place]. Prior right subclavian artery stent in 2016. He underwent a nuclear stress in June 2019 which showed a fixed inferior defect and no ischemia. He began having chest pain in July 2019 and underwent a cardiac cath on 07/10/18 which showed 90% distal left main stenosis, occluded proximal LAD, 90% ostial Circumflex stenosis, 60% in stent restenosis RCA and severe distal RCA stenosis. LIMA to LAD patent. SVG to OM was patent. SVG to RCA and intermediate occluded. PCI of the RCA was performed with placement of 3 drug eluting stents. He then had staged PCI of the distal left main artery into the intermediate vessel with atherectomy and placement of 2 drug eluting stents. He was seen in our office 07/23/18 and had c/o continued chest pain. Admitted to [facility] 08/03/18 with chest pain and ruled out for MI. Imdur increased and he was discharged home on 08/04/18. I saw him in the office 08/06/18 and his pain had improved but he was still having mild pain. Ranexa was started but he did not tolerate. Echo 08/10/18 with LVEF=60-65%. Grade 1 diastolic dysfunction. There was mild mitral regurgitation.   He is here today for follow up. The patient denies any chest pain, dyspnea, palpitations, lower extremity edema, orthopnea, PND, dizziness, near syncope or syncope. BP has been elevated at home.   Primary Care Physician: Georgann HousekeeperHusain, Karrar, MD  Past Medical History:  Diagnosis Date  . Arthritis    "a little in my fingers; never tx'd" (07/17/2018)  . CAD (coronary artery disease)    a. CABG X4 2008. b. MI and stent (in [place]) b. PCI/DES to RCA (07/12/18) c. PCI/DES to LM-ramus (07/17/18)  . Foot fracture, right 1996  . High cholesterol   . Hypertension   . Leg hematoma 01/2018    RLE; "no tx"  . Presence of arterial stent 2016   Right subclavian artery  . Skipped heart beats   . Stenosis of subclavian artery (HCC)    a. s/p R subclavian stent 2016.    Past Surgical History:  Procedure Laterality Date  . AORTIC ARCH ANGIOGRAPHY N/A 07/10/2018   Procedure: AORTIC ARCH ANGIOGRAPHY;  Surgeon: Marykay LexHarding, David W, MD;  [facility];  Service: Cardiovascular;  Laterality: N/A;  . CATARACT EXTRACTION W/ INTRAOCULAR LENS  IMPLANT, BILATERAL Bilateral   . CORONARY ANGIOGRAPHY N/A 07/18/2018   Procedure: CORONARY ANGIOGRAPHY;  Surgeon: Marykay LexHarding, David W, MD;  [facility];  Service: Cardiovascular;  Laterality: N/A;  . CORONARY ANGIOPLASTY WITH STENT PLACEMENT  2016  . CORONARY ARTERY BYPASS GRAFT  2008   "CABG X4"  . CORONARY ATHERECTOMY N/A 07/18/2018   Procedure: CORONARY ATHERECTOMY;  Surgeon: Marykay LexHarding, David W, MD;  [facility];  Service: Cardiovascular;  Laterality: N/A;  . CORONARY STENT INTERVENTION N/A 07/12/2018   Procedure: CORONARY STENT INTERVENTION;  Surgeon: Marykay LexHarding, David W, MD;  [facility];  Service: Cardiovascular;  Laterality: N/A;  . LEFT HEART CATH AND CORS/GRAFTS ANGIOGRAPHY N/A 07/10/2018   Procedure: LEFT HEART CATH AND CORS/GRAFTS ANGIOGRAPHY;  Surgeon: Marykay LexHarding, David W, MD;  [facility];  Service: Cardiovascular;  Laterality: N/A;  . PROSTATECTOMY  ~ 2012   "  removed most of it; no cancer" (07/17/2018)  . SUBCLAVIAN ARTERY STENT Right 2016    Current Outpatient Medications  Medication Sig Dispense Refill  . amLODipine (NORVASC) 10 MG tablet Take 1 tablet (10 mg total) by mouth daily. 90 tablet 3  . Artificial Tear Ointment (DRY EYES OP) Place 1 drop into both eyes as needed (dry eyes).    Marland Kitchen. aspirin 81 MG EC tablet Take 81 mg by mouth at bedtime. Swallow whole.     Marland Kitchen. atorvastatin (LIPITOR) 80 MG tablet Take 1 tablet (80 mg total) by mouth at bedtime. 90 tablet 3  .  clopidogrel (PLAVIX) 75 MG tablet Take 1 tablet (75 mg total) by mouth daily. 90 tablet 3  . Ergocalciferol (VITAMIN D2) 2000 units TABS Take 2,000 Units by mouth at bedtime.    . isosorbide mononitrate (IMDUR) 60 MG 24 hr tablet TAKE 1 TABLET BY MOUTH EVERY DAY 90 tablet 3  . metoprolol succinate (TOPROL-XL) 50 MG 24 hr tablet Take 1 tablet (50 mg total) by mouth 2 (two) times daily. Take with or immediately following a meal. 180 tablet 3  . nitroGLYCERIN (NITROSTAT) 0.4 MG SL tablet Place 0.4 mg under the tongue every 5 (five) minutes as needed for chest pain.   1  . ramipril (ALTACE) 10 MG capsule Take 1 capsule (10 mg total) by mouth daily. 90 capsule 3  . hydrochlorothiazide (HYDRODIURIL) 25 MG tablet Take 1 tablet (25 mg total) by mouth daily. 90 tablet 3   No current facility-administered medications for this visit.     No Known Allergies  Social History   Socioeconomic History  . Marital status: Married    Spouse name: Not on file  . Number of children: 2  . Years of education: COLLEGE  . Highest education level: Not on file  Occupational History  . Occupation: RETIRED  Social Needs  . Financial resource strain: Not on file  . Food insecurity    Worry: Not on file    Inability: Not on file  . Transportation needs    Medical: Not on file    Non-medical: Not on file  Tobacco Use  . Smoking status: Never Smoker  . Smokeless tobacco: Never Used  Substance and Sexual Activity  . Alcohol use: Yes    Comment: 07/17/2018 "a couple drinks/year; if that"  . Drug use: Never  . Sexual activity: Yes  Lifestyle  . Physical activity    Days per week: Not on file    Minutes per session: Not on file  . Stress: Not on file  Relationships  . Social Musicianconnections    Talks on phone: Not on file    Gets together: Not on file    Attends religious service: Not on file    Active member of club or organization: Not on file    Attends meetings of clubs or organizations: Not on file     Relationship status: Not on file  . Intimate partner violence    Fear of current or ex partner: Not on file    Emotionally abused: Not on file    Physically abused: Not on file    Forced sexual activity: Not on file  Other Topics Concern  . Not on file  Social History Narrative  . Not on file    Family History  Problem Relation Age of Onset  . Healthy Mother        lived to be 6199  . CAD Father 4148  . Diabetes  Brother   . Hypertension Brother   . CAD Brother 65       CABG    Review of Systems:  As stated in the HPI and otherwise negative.   BP (!) 160/80   Pulse 90   Ht 5\' 9"  (1.753 m)   Wt 175 lb (79.4 kg)   SpO2 98%   BMI 25.84 kg/m   Physical Examination:  General: Well developed, well nourished, NAD  HEENT: OP clear, mucus membranes moist  SKIN: warm, dry. No rashes. Neuro: No focal deficits  Musculoskeletal: Muscle strength 5/5 all ext  Psychiatric: Mood and affect normal  Neck: No JVD, no carotid bruits, no thyromegaly, no lymphadenopathy.  Lungs:Clear bilaterally, no wheezes, rhonci, crackles Cardiovascular: Regular rate and rhythm. No murmurs, gallops or rubs. Abdomen:Soft. Bowel sounds present. Non-tender.  Extremities: No lower extremity edema. Pulses are 2 + in the bilateral DP/PT.  EKG:  EKG is ordered today. The ekg ordered today demonstrates NSR, rate 90 bpm.   Recent Labs: 07/09/2018: Magnesium 2.4 07/10/2018: ALT 31 07/17/2018: B Natriuretic Peptide 53.1 08/03/2018: BUN 16; Potassium 4.1; Sodium 140 08/04/2018: Creatinine, Ser 1.04; Hemoglobin 15.4; Platelets 286   Lipid Panel    Component Value Date/Time   CHOL 108 07/18/2018 0425   TRIG 87 07/18/2018 0425   HDL 32 (L) 07/18/2018 0425   CHOLHDL 3.4 07/18/2018 0425   VLDL 17 07/18/2018 0425   LDLCALC 59 07/18/2018 0425     Wt Readings from Last 3 Encounters:  06/20/19 175 lb (79.4 kg)  09/19/18 175 lb 6.4 oz (79.6 kg)  08/06/18 169 lb (76.7 kg)     Other studies Reviewed: Additional  studies/ records that were reviewed today include: . Review of the above records demonstrates:    Assessment and Plan:   1. CAD s/p CABG with angina: He has severe CAD s/p CABG. He had several caths in 2019 with placement of stents in the RCA and left main artery into the intermediate branch. The LIMA to the LAD is patent and the SVG to the OM is patent. Echo August 2019 with normal LV systolic function. No chest pain. He did not tolerate Ranexa.  Continue ASA, Imdur, Plavix, statin and Norvasc.    2. PAD: He is s/p right subclavian artery stent  3. HTN: BP has been elevated at home. Will continue Norvasc 10 mg daily, Ramipril 10 mg daily, Toprol 100D.Will add HCTZ 25 mg daily. Will check BMET today.   4. HLD: Lipids are well controlled. Will continue statin  5. PVCs: He only has rare palpitations. Continue beta blocker.   Current medicines are reviewed at length with the patient today.  The patient does not have concerns regarding medicines.  The following changes have been made:  no change  Labs/ tests ordered today include:   Orders Placed This Encounter  Procedures  . Basic metabolic panel  . EKG 12-Lead     Disposition:   FU with me in  12  months   Signed, Lauree Chandler, MD 06/20/2019 10:32 AM    [facility] [place], [place], [place]  [address] Phone: [phone]; Fax: [phone]

## 2019-06-19 NOTE — Telephone Encounter (Signed)

## 2019-06-20 ENCOUNTER — Other Ambulatory Visit: Payer: Self-pay

## 2019-06-20 ENCOUNTER — Encounter (INDEPENDENT_AMBULATORY_CARE_PROVIDER_SITE_OTHER): Payer: Self-pay

## 2019-06-20 ENCOUNTER — Ambulatory Visit (INDEPENDENT_AMBULATORY_CARE_PROVIDER_SITE_OTHER): Payer: Medicare Other | Admitting: Cardiovascular Disease

## 2019-06-20 ENCOUNTER — Encounter: Payer: Self-pay | Admitting: Cardiovascular Disease

## 2019-06-20 VITALS — BP 160/80 | HR 90 | Ht 69.0 in | Wt 175.0 lb

## 2019-06-20 DIAGNOSIS — I10 Essential (primary) hypertension: Secondary | ICD-10-CM | POA: Diagnosis not present

## 2019-06-20 DIAGNOSIS — I25118 Atherosclerotic heart disease of native coronary artery with other forms of angina pectoris: Secondary | ICD-10-CM

## 2019-06-20 DIAGNOSIS — E785 Hyperlipidemia, unspecified: Secondary | ICD-10-CM

## 2019-06-20 DIAGNOSIS — I493 Ventricular premature depolarization: Secondary | ICD-10-CM

## 2019-06-20 DIAGNOSIS — I739 Peripheral vascular disease, unspecified: Secondary | ICD-10-CM

## 2019-06-20 LAB — BASIC METABOLIC PANEL
BUN/Creatinine Ratio: 21 (ref 10–24)
BUN: 20 mg/dL (ref 8–27)
CO2: 23 mmol/L (ref 20–29)
Calcium: 10 mg/dL (ref 8.6–10.2)
Chloride: 104 mmol/L (ref 96–106)
Creatinine, Ser: 0.97 mg/dL (ref 0.76–1.27)
GFR calc Af Amer: 86 mL/min/{1.73_m2} (ref >59)
GFR calc non Af Amer: 74 mL/min/{1.73_m2} (ref >59)
Glucose: 98 mg/dL (ref 65–99)
Potassium: 4.1 mmol/L (ref 3.5–5.2)
Sodium: 142 mmol/L (ref 134–144)

## 2019-06-20 MED ORDER — HYDROCHLOROTHIAZIDE 25 MG PO TABS: 25.0000 mg | ORAL_TABLET | Freq: Every day | ORAL | 3 refills | Status: DC

## 2019-06-20 NOTE — Patient Instructions (Signed)
Medication Instructions:  1) START HYDROCHLOROTHIAZIDE 25 mg daily   Labwork: TODAY: BMET  Testing/Procedures: None  Follow-Up: Your provider wants you to follow-up in: 1 year with Dr. Angelena Form. You will receive a reminder letter in the mail two months in advance. If you don't receive a letter, please call our office to schedule the follow-up appointment.

## 2019-06-26 ENCOUNTER — Other Ambulatory Visit: Payer: Self-pay | Admitting: Cardiovascular Disease

## 2019-07-02 DIAGNOSIS — N400 Enlarged prostate without lower urinary tract symptoms: Secondary | ICD-10-CM | POA: Diagnosis not present

## 2019-07-02 DIAGNOSIS — I700 Atherosclerosis of aorta: Secondary | ICD-10-CM | POA: Diagnosis not present

## 2019-07-02 DIAGNOSIS — I10 Essential (primary) hypertension: Secondary | ICD-10-CM | POA: Diagnosis not present

## 2019-07-02 DIAGNOSIS — Z1389 Encounter for screening for other disorder: Secondary | ICD-10-CM | POA: Diagnosis not present

## 2019-07-02 DIAGNOSIS — R7303 Prediabetes: Secondary | ICD-10-CM | POA: Diagnosis not present

## 2019-07-02 DIAGNOSIS — K219 Gastro-esophageal reflux disease without esophagitis: Secondary | ICD-10-CM | POA: Diagnosis not present

## 2019-07-02 DIAGNOSIS — Z0000 Encounter for general adult medical examination without abnormal findings: Secondary | ICD-10-CM | POA: Diagnosis not present

## 2019-07-02 DIAGNOSIS — I2510 Atherosclerotic heart disease of native coronary artery without angina pectoris: Secondary | ICD-10-CM | POA: Diagnosis not present

## 2019-07-02 DIAGNOSIS — I739 Peripheral vascular disease, unspecified: Secondary | ICD-10-CM | POA: Diagnosis not present

## 2019-07-02 DIAGNOSIS — E7800 Pure hypercholesterolemia, unspecified: Secondary | ICD-10-CM | POA: Diagnosis not present

## 2019-07-02 DIAGNOSIS — Z951 Presence of aortocoronary bypass graft: Secondary | ICD-10-CM | POA: Diagnosis not present

## 2019-07-19 DIAGNOSIS — I10 Essential (primary) hypertension: Secondary | ICD-10-CM | POA: Diagnosis not present

## 2019-07-19 DIAGNOSIS — E7800 Pure hypercholesterolemia, unspecified: Secondary | ICD-10-CM | POA: Diagnosis not present

## 2019-07-19 DIAGNOSIS — N400 Enlarged prostate without lower urinary tract symptoms: Secondary | ICD-10-CM | POA: Diagnosis not present

## 2019-07-19 DIAGNOSIS — R7303 Prediabetes: Secondary | ICD-10-CM | POA: Diagnosis not present

## 2019-08-06 ENCOUNTER — Telehealth: Payer: Self-pay | Admitting: Cardiovascular Disease

## 2019-08-06 MED ORDER — METOPROLOL SUCCINATE ER 50 MG PO TB24: ORAL_TABLET | ORAL | 3 refills | Status: DC

## 2019-08-06 NOTE — Telephone Encounter (Signed)
New Message    Patient is calling because he was advised to call and discuss how he is feeling with his new medication.

## 2019-08-06 NOTE — Telephone Encounter (Signed)
I spoke with Robert [name]. Initially after starting HCTZ his BP was 125-130/75 range.  For the last couple of weeks it has been elevated and "eratic".  He is trying to remain active by riding his exercise bike.  Does not add salt but thinks he has probably been getting more salt in his diet recently. Reports recent readings have averaged 140/80-88.  Heart rate in the 70's. He checks about 2 hours after taking his morning medications and in the afternoon. Robert [name] reports Dr. Angelena Form told him if still elevated after starting HCTZ the next step would possibly be increasing metoprolol succinate.

## 2019-08-06 NOTE — Telephone Encounter (Signed)
Pt notified. Will send new prescription to [facility].  I asked pt to monitor BP and if after about a week it was running greater than 546 systolic to let us know.

## 2019-08-06 NOTE — Telephone Encounter (Signed)
We can increase his Toprol to 75 mg po BID. Goal systolic BP is under 786. Thanks, chris

## 2019-08-12 ENCOUNTER — Other Ambulatory Visit: Payer: Self-pay | Admitting: Cardiovascular Disease

## 2019-09-09 DIAGNOSIS — Z23 Encounter for immunization: Secondary | ICD-10-CM | POA: Diagnosis not present

## 2020-01-02 DIAGNOSIS — N400 Enlarged prostate without lower urinary tract symptoms: Secondary | ICD-10-CM | POA: Diagnosis not present

## 2020-01-02 DIAGNOSIS — I739 Peripheral vascular disease, unspecified: Secondary | ICD-10-CM | POA: Diagnosis not present

## 2020-01-02 DIAGNOSIS — I10 Essential (primary) hypertension: Secondary | ICD-10-CM | POA: Diagnosis not present

## 2020-01-02 DIAGNOSIS — I2510 Atherosclerotic heart disease of native coronary artery without angina pectoris: Secondary | ICD-10-CM | POA: Diagnosis not present

## 2020-01-02 DIAGNOSIS — E7800 Pure hypercholesterolemia, unspecified: Secondary | ICD-10-CM | POA: Diagnosis not present

## 2020-01-02 DIAGNOSIS — Z951 Presence of aortocoronary bypass graft: Secondary | ICD-10-CM | POA: Diagnosis not present

## 2020-01-02 DIAGNOSIS — R7303 Prediabetes: Secondary | ICD-10-CM | POA: Diagnosis not present

## 2020-01-02 DIAGNOSIS — I700 Atherosclerosis of aorta: Secondary | ICD-10-CM | POA: Diagnosis not present

## 2020-02-27 ENCOUNTER — Ambulatory Visit: Payer: Medicare Other | Attending: Internal Medicine

## 2020-02-27 DIAGNOSIS — Z23 Encounter for immunization: Secondary | ICD-10-CM

## 2020-02-27 NOTE — Progress Notes (Signed)
   Covid-19 Vaccination Clinic  Name:  Flem Enderle    MRN: [id number] DOB: 02/21/1941  02/27/2020  Mr. Serpa was observed post Covid-19 immunization for 15 minutes without incident. He was provided with Vaccine Information Sheet and instruction to access the V-Safe system.   Mr. Dearmas was instructed to call 911 with any severe reactions post vaccine: Marland Kitchen Difficulty breathing  . Swelling of face and throat  . A fast heartbeat  . A bad rash all over body  . Dizziness and weakness   Immunizations Administered    Name Date Dose VIS Date Route   Pfizer COVID-19 Vaccine 02/27/2020  1:55 PM 0.3 mL 11/29/2019 Intramuscular   Manufacturer: [employer], [employer]   Lot: [id number]   NDC: [address]-5

## 2020-03-20 DIAGNOSIS — Z20828 Contact with and (suspected) exposure to other viral communicable diseases: Secondary | ICD-10-CM | POA: Diagnosis not present

## 2020-03-23 ENCOUNTER — Ambulatory Visit: Payer: Medicare Other | Attending: Internal Medicine

## 2020-03-23 DIAGNOSIS — Z23 Encounter for immunization: Secondary | ICD-10-CM

## 2020-03-23 NOTE — Progress Notes (Signed)
   Covid-19 Vaccination Clinic  Name:  Robert [name]    MRN: [id number] DOB: 1941/03/03  03/23/2020  Mr. Robert [name] was observed post Covid-19 immunization for 15 minutes without incident. He was provided with Vaccine Information Sheet and instruction to access the V-Safe system.   Mr. Robert [name] was instructed to call 911 with any severe reactions post vaccine: Marland Kitchen Difficulty breathing  . Swelling of face and throat  . A fast heartbeat  . A bad rash all over body  . Dizziness and weakness   Immunizations Administered    Name Date Dose VIS Date Route   Pfizer COVID-19 Vaccine 03/23/2020 12:28 PM 0.3 mL 11/29/2019 Intramuscular   Manufacturer: [employer], [employer]   Lot: [id number]   NDC: [address]-0

## 2020-04-24 ENCOUNTER — Telehealth: Payer: Self-pay | Admitting: Cardiovascular Disease

## 2020-04-24 ENCOUNTER — Other Ambulatory Visit: Payer: Self-pay

## 2020-04-24 MED ORDER — CLOPIDOGREL BISULFATE 75 MG PO TABS: 75.0000 mg | ORAL_TABLET | Freq: Every day | ORAL | 0 refills | Status: DC

## 2020-04-24 NOTE — Telephone Encounter (Signed)
Pt's medication was sent to pt's pharmacy as requested. Confirmation received.  °

## 2020-04-24 NOTE — Telephone Encounter (Signed)
Pt's medication has already been sent to pt's pharmacy as requested. Confirmation received.  

## 2020-04-24 NOTE — Telephone Encounter (Signed)
°*  STAT* If patient is at the pharmacy, call can be transferred to refill team. ° ° °1. Which medications need to be refilled? (please list name of each medication and dose if known) clopidogrel (PLAVIX) 75 MG tablet ° °2. Which pharmacy/location (including street and city if local pharmacy) is medication to be sent to? [id number] - [place], [place] - [address] ° °3. Do they need a 30 day or 90 day supply? 90  °

## 2020-05-07 ENCOUNTER — Other Ambulatory Visit: Payer: Self-pay | Admitting: Cardiovascular Disease

## 2020-07-09 ENCOUNTER — Other Ambulatory Visit: Payer: Self-pay | Admitting: Cardiovascular Disease

## 2020-07-14 ENCOUNTER — Other Ambulatory Visit: Payer: Self-pay | Admitting: Surgery

## 2020-07-14 ENCOUNTER — Emergency Department (HOSPITAL_COMMUNITY)
Admission: EM | Admit: 2020-07-14 | Discharge: 2020-07-14 | Disposition: A | Discharge status: Home / Self Care | Payer: Medicare Other | Attending: Emergency Medicine | Admitting: Emergency Medicine

## 2020-07-14 ENCOUNTER — Encounter (HOSPITAL_COMMUNITY): Payer: Self-pay | Admitting: Emergency Medicine

## 2020-07-14 DIAGNOSIS — Z7982 Long term (current) use of aspirin: Secondary | ICD-10-CM | POA: Diagnosis not present

## 2020-07-14 DIAGNOSIS — Z951 Presence of aortocoronary bypass graft: Secondary | ICD-10-CM | POA: Diagnosis not present

## 2020-07-14 DIAGNOSIS — N5089 Other specified disorders of the male genital organs: Secondary | ICD-10-CM | POA: Diagnosis not present

## 2020-07-14 DIAGNOSIS — K5900 Constipation, unspecified: Secondary | ICD-10-CM | POA: Diagnosis not present

## 2020-07-14 DIAGNOSIS — K4090 Unilateral inguinal hernia, without obstruction or gangrene, not specified as recurrent: Secondary | ICD-10-CM | POA: Diagnosis not present

## 2020-07-14 DIAGNOSIS — R109 Unspecified abdominal pain: Secondary | ICD-10-CM | POA: Diagnosis present

## 2020-07-14 DIAGNOSIS — I2510 Atherosclerotic heart disease of native coronary artery without angina pectoris: Secondary | ICD-10-CM | POA: Diagnosis not present

## 2020-07-14 DIAGNOSIS — R52 Pain, unspecified: Secondary | ICD-10-CM | POA: Diagnosis not present

## 2020-07-14 DIAGNOSIS — I10 Essential (primary) hypertension: Secondary | ICD-10-CM | POA: Insufficient documentation

## 2020-07-14 DIAGNOSIS — R1031 Right lower quadrant pain: Secondary | ICD-10-CM | POA: Diagnosis not present

## 2020-07-14 DIAGNOSIS — Z79899 Other long term (current) drug therapy: Secondary | ICD-10-CM | POA: Insufficient documentation

## 2020-07-14 LAB — CBC
HCT: 50.4 % (ref 39.0–52.0)
Hemoglobin: 16.6 g/dL (ref 13.0–17.0)
MCH: 29.9 pg (ref 26.0–34.0)
MCHC: 32.9 g/dL (ref 30.0–36.0)
MCV: 90.8 fL (ref 80.0–100.0)
Platelets: 305 10*3/uL (ref 150–400)
RBC: 5.55 MIL/uL (ref 4.22–5.81)
RDW: 13.4 % (ref 11.5–15.5)
WBC: 10.4 10*3/uL (ref 4.0–10.5)
nRBC: 0 % (ref 0.0–0.2)

## 2020-07-14 LAB — COMPREHENSIVE METABOLIC PANEL
ALT: 30 U/L (ref 0–44)
AST: 36 U/L (ref 15–41)
Albumin: 3.7 g/dL (ref 3.5–5.0)
Alkaline Phosphatase: 63 U/L (ref 38–126)
Anion gap: 11 (ref 5–15)
BUN: 15 mg/dL (ref 8–23)
CO2: 27 mmol/L (ref 22–32)
Calcium: 9.5 mg/dL (ref 8.9–10.3)
Chloride: 102 mmol/L (ref 98–111)
Creatinine, Ser: 1.03 mg/dL (ref 0.61–1.24)
GFR calc Af Amer: >60 mL/min (ref >60)
GFR calc non Af Amer: >60 mL/min (ref >60)
Glucose, Bld: 111 mg/dL — ABNORMAL HIGH (ref 70–99)
Potassium: 4.1 mmol/L (ref 3.5–5.1)
Sodium: 140 mmol/L (ref 135–145)
Total Bilirubin: 1.8 mg/dL — ABNORMAL HIGH (ref 0.3–1.2)
Total Protein: 6.3 g/dL — ABNORMAL LOW (ref 6.5–8.1)

## 2020-07-14 LAB — LIPASE, BLOOD: Lipase: 24 U/L (ref 11–51)

## 2020-07-14 MED ORDER — SODIUM CHLORIDE 0.9% FLUSH: 3.0000 mL | Freq: Once | INTRAVENOUS | Status: DC

## 2020-07-14 NOTE — Discharge Instructions (Signed)
Please follow-up with general surgery, I have provided the contact information in your discharge paperwork.  Please call to schedule an appointment.  Please return to the ER if you have worsening pain, nausea, diarrhea, rectal bleeding, fevers, worsening abdominal pain, or any other new or concerning symptoms.

## 2020-07-14 NOTE — Consult Note (Signed)
[facility] Surgery Consult Note  Robert [name] 1941-07-29  [id number].    Requesting MD: Rubin Payor  Chief Complaint/Reason for Consult: R inguinal hernia   HPI:  Patient is a 79 year old male who presented to [facility] with complaint of right inguinal hernia. He reports it has gotten worse over the last several months. He reports he has a truss belt but finds it uncomfortable. Some mild right groin pain but hernia is reducible. Patient reports some chronic constipation but has taken miralax for this in the past with good success. Denies fever, chills, chest pain, SOB, abdominal pain, nausea, urinary symptoms. PMH significant for CAD s/p CABG in 2008 and stents in 2019, PAD, HTN, HLD. NKDA. Patient drinks on rare occasions, denies tobacco or illicit drug use. Has had both COVID vaccinations.   ROS: Review of Systems  Constitutional: Negative for chills, fever and weight loss.  Respiratory: Negative for shortness of breath and wheezing.   Cardiovascular: Negative for chest pain and palpitations.  Gastrointestinal: Positive for constipation (chronic). Negative for abdominal pain, blood in stool, diarrhea, melena, nausea and vomiting.  Genitourinary: Negative for dysuria, frequency, hematuria and urgency.       Right groin pain and swelling.   All other systems reviewed and are negative.   Family History  Problem Relation Age of Onset  . Healthy Mother        lived to be 59  . CAD Father 76  . Diabetes Brother   . Hypertension Brother   . CAD Brother 92       CABG    Past Medical History:  Diagnosis Date  . Arthritis    "a little in my fingers; never tx'd" (07/17/2018)  . CAD (coronary artery disease)    a. CABG X4 2008. b. MI and stent (in [place]) b. PCI/DES to RCA (07/12/18) c. PCI/DES to LM-ramus (07/17/18)  . Foot fracture, right 1996  . High cholesterol   . Hypertension   . Leg hematoma 01/2018   RLE; "no tx"  . Presence of arterial stent 2016   Right subclavian artery  .  Skipped heart beats   . Stenosis of subclavian artery (HCC)    a. s/p R subclavian stent 2016.    Past Surgical History:  Procedure Laterality Date  . AORTIC ARCH ANGIOGRAPHY N/A 07/10/2018   Procedure: AORTIC ARCH ANGIOGRAPHY;  Surgeon: Marykay Lex, MD;  Location: [facility] INVASIVE CV LAB;  Service: Cardiovascular;  Laterality: N/A;  . CATARACT EXTRACTION W/ INTRAOCULAR LENS  IMPLANT, BILATERAL Bilateral   . CORONARY ANGIOGRAPHY N/A 07/18/2018   Procedure: CORONARY ANGIOGRAPHY;  Surgeon: Marykay Lex, MD;  Location: [facility] INVASIVE CV LAB;  Service: Cardiovascular;  Laterality: N/A;  . CORONARY ANGIOPLASTY WITH STENT PLACEMENT  2016  . CORONARY ARTERY BYPASS GRAFT  2008   "CABG X4"  . CORONARY ATHERECTOMY N/A 07/18/2018   Procedure: CORONARY ATHERECTOMY;  Surgeon: Marykay Lex, MD;  Location: [facility] INVASIVE CV LAB;  Service: Cardiovascular;  Laterality: N/A;  . CORONARY STENT INTERVENTION N/A 07/12/2018   Procedure: CORONARY STENT INTERVENTION;  Surgeon: Marykay Lex, MD;  Location: [facility] INVASIVE CV LAB;  Service: Cardiovascular;  Laterality: N/A;  . LEFT HEART CATH AND CORS/GRAFTS ANGIOGRAPHY N/A 07/10/2018   Procedure: LEFT HEART CATH AND CORS/GRAFTS ANGIOGRAPHY;  Surgeon: Marykay Lex, MD;  Location: [facility] INVASIVE CV LAB;  Service: Cardiovascular;  Laterality: N/A;  . PROSTATECTOMY  ~ 2012   "removed most of it; no cancer" (07/17/2018)  . SUBCLAVIAN ARTERY  STENT Right 2016    Social History:  reports that he has never smoked. He has never used smokeless tobacco. He reports current alcohol use. He reports that he does not use drugs.  Allergies: No Known Allergies  (Not in a hospital admission)   Blood pressure (!) 118/91, pulse 84, temperature 98.4 F (36.9 C), temperature source Oral, resp. rate 14, height 5\' 9"  (1.753 m), weight 80.3 kg, SpO2 96 %. Physical Exam:  General: pleasant, WD, WN white male who is laying in bed in NAD HEENT:  Sclera are noninjected.  PERRL.  Ears and  nose without any masses or lesions.  Mouth is pink and moist Heart: regular, rate, and rhythm.  Normal s1,s2. No obvious murmurs, gallops, or rubs noted.  Palpable radial and pedal pulses bilaterally Lungs: CTAB, no wheezes, rhonchi, or rales noted.  Respiratory effort nonlabored Abd: soft, NT, ND, +BS GU: right inguinal hernia easily reducible  MS: all 4 extremities are symmetrical with no cyanosis, clubbing, or edema. Skin: warm and dry with no masses, lesions, or rashes Neuro: Cranial nerves 2-12 grossly intact, sensation grossly intact throughout Psych: A&Ox3 with an appropriate affect.   Results for orders placed or performed during the hospital encounter of 07/14/20 (from the past 48 hour(s))  Lipase, blood     Status: None   Collection Time: 07/14/20 10:48 AM  Result Value Ref Range   Lipase 24 11 - 51 U/L    Comment: Performed at [facility], [address] [place], [place]  Comprehensive metabolic panel     Status: Abnormal   Collection Time: 07/14/20 10:48 AM  Result Value Ref Range   Sodium 140 135 - 145 mmol/L   Potassium 4.1 3.5 - 5.1 mmol/L   Chloride 102 98 - 111 mmol/L   CO2 27 22 - 32 mmol/L   Glucose, Bld 111 (H) 70 - 99 mg/dL    Comment: Glucose reference range applies only to samples taken after fasting for at least 8 hours.   BUN 15 8 - 23 mg/dL   Creatinine, Ser 07/16/20 0.61 - 1.24 mg/dL   Calcium 9.5 8.9 - 9.38 mg/dL   Total Protein 6.3 (L) 6.5 - 8.1 g/dL   Albumin 3.7 3.5 - 5.0 g/dL   AST 36 15 - 41 U/L   ALT 30 0 - 44 U/L   Alkaline Phosphatase 63 38 - 126 U/L   Total Bilirubin 1.8 (H) 0.3 - 1.2 mg/dL   GFR calc non Af Amer >60 >60 mL/min   GFR calc Af Amer >60 >60 mL/min   Anion gap 11 5 - 15    Comment: Performed at [facility], [address], [place], [place]  CBC     Status: None   Collection Time: 07/14/20 10:48 AM  Result Value Ref Range   WBC 10.4 4.0 - 10.5 K/uL   RBC 5.55 4.22 - 5.81 MIL/uL   Hemoglobin 16.6  13.0 - 17.0 g/dL   HCT 07/16/20 39 - 52 %   MCV 90.8 80.0 - 100.0 fL   MCH 29.9 26.0 - 34.0 pg   MCHC 32.9 30.0 - 36.0 g/dL   RDW 58.5 27.7 - 82.4 %   Platelets 305 150 - 400 K/uL   nRBC 0.0 0.0 - 0.2 %    Comment: Performed at [facility], [address] [place], [place]   No results found.    Assessment/Plan CAD s/p CABG and stents -  on plavix and ASA PAD HTN HLD Chronic constipation - recommend colace and miralax OTC prn   Right inguinal hernia - reducible - continue truss and reduction at home prn - ok to discharge home and plan surgery on an elective basis - will need cardiology clearance prior to surgery - our office will reach out to him for scheduling   Juliet Rude, [facility] 07/14/2020, 2:29 PM Please see Amion for pager number during day hours 7:00am-4:30pm

## 2020-07-14 NOTE — ED Provider Notes (Signed)
[facility] EMERGENCY DEPARTMENT Provider Note   CSN: [id number] Arrival date & time: 07/14/20  1039     History Chief Complaint  Patient presents with  . Groin Pain    Robert [name] is a 79 y.o. male.  HPI  79 year-old male with history of CAD, hypertension, arthritis Zentz to the ER for worsening right-sided groin pain.  Patient states that he thinks that he has a hernia, states that he has been having pain to the right lower groin for the last several months, worsening over the last few days.  He denies any difficulty urinating, no blood in his urine.  No bowel bladder incontinence.  He states that he is constipated chronically, but there have been no new changes to his bowel movements.  He denies any fevers or chills.  No nausea or vomiting.  He has not had this evaluated before, but suspects that this is a hernia.    Past Medical History:  Diagnosis Date  . Arthritis    "a little in my fingers; never tx'd" (07/17/2018)  . CAD (coronary artery disease)    a. CABG X4 2008. b. MI and stent (in [place]) b. PCI/DES to RCA (07/12/18) c. PCI/DES to LM-ramus (07/17/18)  . Foot fracture, right 1996  . High cholesterol   . Hypertension   . Leg hematoma 01/2018   RLE; "no tx"  . Presence of arterial stent 2016   Right subclavian artery  . Skipped heart beats   . Stenosis of subclavian artery (HCC)    a. s/p R subclavian stent 2016.    Patient Active Problem List   Diagnosis Date Noted  . Palpitations   . Unstable angina (HCC)   . Chest pain 07/09/2018  . CAD (coronary artery disease) 05/25/2018  . Essential (primary) hypertension 05/25/2018  . Hyperlipidemia LDL goal <70 05/25/2018    Past Surgical History:  Procedure Laterality Date  . AORTIC ARCH ANGIOGRAPHY N/A 07/10/2018   Procedure: AORTIC ARCH ANGIOGRAPHY;  Surgeon: Marykay Lex, MD;  Location: [facility] INVASIVE CV LAB;  Service: Cardiovascular;  Laterality: N/A;  . CATARACT EXTRACTION W/ INTRAOCULAR LENS   IMPLANT, BILATERAL Bilateral   . CORONARY ANGIOGRAPHY N/A 07/18/2018   Procedure: CORONARY ANGIOGRAPHY;  Surgeon: Marykay Lex, MD;  Location: [facility] INVASIVE CV LAB;  Service: Cardiovascular;  Laterality: N/A;  . CORONARY ANGIOPLASTY WITH STENT PLACEMENT  2016  . CORONARY ARTERY BYPASS GRAFT  2008   "CABG X4"  . CORONARY ATHERECTOMY N/A 07/18/2018   Procedure: CORONARY ATHERECTOMY;  Surgeon: Marykay Lex, MD;  Location: [facility] INVASIVE CV LAB;  Service: Cardiovascular;  Laterality: N/A;  . CORONARY STENT INTERVENTION N/A 07/12/2018   Procedure: CORONARY STENT INTERVENTION;  Surgeon: Marykay Lex, MD;  Location: [facility] INVASIVE CV LAB;  Service: Cardiovascular;  Laterality: N/A;  . LEFT HEART CATH AND CORS/GRAFTS ANGIOGRAPHY N/A 07/10/2018   Procedure: LEFT HEART CATH AND CORS/GRAFTS ANGIOGRAPHY;  Surgeon: Marykay Lex, MD;  Location: [facility] INVASIVE CV LAB;  Service: Cardiovascular;  Laterality: N/A;  . PROSTATECTOMY  ~ 2012   "removed most of it; no cancer" (07/17/2018)  . SUBCLAVIAN ARTERY STENT Right 2016       Family History  Problem Relation Age of Onset  . Healthy Mother        lived to be 47  . CAD Father 67  . Diabetes Brother   . Hypertension Brother   . CAD Brother 65       CABG  Social History   Tobacco Use  . Smoking status: Never Smoker  . Smokeless tobacco: Never Used  Vaping Use  . Vaping Use: Never used  Substance Use Topics  . Alcohol use: Yes    Comment: 07/17/2018 "a couple drinks/year; if that"  . Drug use: Never    Home Medications Prior to Admission medications   Medication Sig Start Date End Date Taking? Authorizing Provider  amLODipine (NORVASC) 10 MG tablet Take 1 tablet by mouth every day 07/09/20  Yes Kathleene Hazel, MD  Artificial Tear Ointment (DRY EYES OP) Place 1 drop into both eyes as needed (dry eyes).   Yes [provider]  aspirin 81 MG EC tablet Take 81 mg by mouth at bedtime. Swallow whole.    Yes [provider]  atorvastatin (LIPITOR) 80 MG tablet Take 1 tablet by mouth every day at bedtime 07/09/20  Yes Kathleene Hazel, MD  clopidogrel (PLAVIX) 75 MG tablet Take 1 tablet (75 mg total) by mouth daily. Please make yearly appt with Dr. Clifton James for July for future refills. 1st attempt Patient taking differently: Take 75 mg by mouth daily.  04/24/20  Yes Kathleene Hazel, MD  Ergocalciferol (VITAMIN D2) 2000 units TABS Take 2,000 Units by mouth daily.    Yes [provider]  hydrochlorothiazide (HYDRODIURIL) 25 MG tablet Take 1 tablet by mouth every day 05/07/20  Yes Kathleene Hazel, MD  isosorbide mononitrate (IMDUR) 60 MG 24 hr tablet Take 1 tablet by mouth every day 06/26/19  Yes Kathleene Hazel, MD  metoprolol succinate (TOPROL-XL) 50 MG 24 hr tablet Take 1 and 1/2 tablets by mouth twice daily. Take with or immediately following a meal. (dose increase) 07/09/20  Yes Kathleene Hazel, MD  nitroGLYCERIN (NITROSTAT) 0.4 MG SL tablet Place 0.4 mg under the tongue every 5 (five) minutes as needed for chest pain.  03/28/18  Yes [provider]  ramipril (ALTACE) 10 MG capsule Take 1 capsule by mouth every day Patient taking differently: Take 10 mg by mouth at bedtime.  07/09/20  Yes Kathleene Hazel, MD    Allergies    Patient has no known allergies.  Review of Systems   Review of Systems  Constitutional: Negative for chills and fever.  Gastrointestinal: Positive for constipation (Chronically). Negative for diarrhea, nausea and vomiting.  Genitourinary: Positive for scrotal swelling. Negative for decreased urine volume, difficulty urinating, discharge, flank pain and penile pain.    Physical Exam Updated Vital Signs BP (!) 118/91 (BP Location: Right Arm)   Pulse 84   Temp 98.4 F (36.9 C) (Oral)   Resp 14   Ht 5\' 9"  (1.753 m)   Wt 80.3 kg   SpO2 96%   BMI 26.14 kg/m   Physical Exam Vitals and nursing note reviewed.  Constitutional:        General: He is not in acute distress.    Appearance: Normal appearance. He is well-developed. He is not ill-appearing or diaphoretic.  HENT:     Head: Normocephalic and atraumatic.  Eyes:     Conjunctiva/sclera: Conjunctivae normal.  Cardiovascular:     Rate and Rhythm: Normal rate and regular rhythm.     Heart sounds: No murmur heard.   Pulmonary:     Effort: Pulmonary effort is normal. No respiratory distress.     Breath sounds: Normal breath sounds.  Abdominal:     General: Abdomen is flat.     Palpations: Abdomen is soft.  Tenderness: There is no abdominal tenderness.  Genitourinary:    Comments: Right-sided inguinal hernia with significant scrotal swelling, likely tracking into the scrotal sac.  Reducible on exam, however stable.  Tender to palpation.  No noticeable gangrene. Musculoskeletal:        General: Normal range of motion.     Cervical back: Neck supple.  Skin:    General: Skin is warm and dry.  Neurological:     General: No focal deficit present.     Mental Status: He is alert and oriented to person, place, and time.     Sensory: No sensory deficit.     Motor: No weakness.     ED Results / Procedures / Treatments   Labs (all labs ordered are listed, but only abnormal results are displayed) Labs Reviewed  COMPREHENSIVE METABOLIC PANEL - Abnormal; Notable for the following components:      Result Value   Glucose, Bld 111 (*)    Total Protein 6.3 (*)    Total Bilirubin 1.8 (*)    All other components within normal limits  LIPASE, BLOOD  CBC  URINALYSIS, ROUTINE W REFLEX MICROSCOPIC    EKG None  Radiology No results found.  Procedures Procedures (including critical care time)  Medications Ordered in ED Medications  sodium chloride flush (NS) 0.9 % injection 3 mL (has no administration in time range)    ED Course  I have reviewed the triage vital signs and the nursing notes.  Pertinent labs & imaging results that were available during  my care of the patient were reviewed by me and considered in my medical decision making (see chart for details).    MDM Rules/Calculators/A&P                         79 year old male with right inguinal hernia Hernia is reducible without difficulty. No indication of incarcerated or strangulated hernia. Abdomen soft and nontender without peritoneal signs. Patient without nausea vomiting, diarrhea, rectal bleeding. Patient is afebrile, non-tachycardic, alert, oriented and nontoxic appearing.  Patient seen and evaluated by Dr. Rubin Payor, he was able to reduce the hernia.  However there is suspicion that it will not stay reduced.  Joyce Gross with general surgery, he saw and evaluated the patient.  They will see him in the office.  Will refer the patient to them.  Return precautions discussed.  All the patient's questions have been answered to his satisfaction, he voices understanding is agreeable to this plan.  At this stage in the ED course, the patient is medically screened and is stable for discharge.   Final Clinical Impression(s) / ED Diagnoses Final diagnoses:  Unilateral inguinal hernia without obstruction or gangrene, recurrence not specified    Rx / DC Orders ED Discharge Orders    None       Leone Brand 07/14/20 1448    Benjiman Core, MD 07/14/20 [phone]

## 2020-07-14 NOTE — ED Triage Notes (Signed)
Pt arrives from home via [facility] with c/o of right sided groin pain over the last couple of months worse in the last few days, pt reports possible hernia.

## 2020-07-16 ENCOUNTER — Other Ambulatory Visit: Payer: Self-pay | Admitting: Cardiovascular Disease

## 2020-07-16 ENCOUNTER — Telehealth: Payer: Self-pay | Admitting: *Deleted

## 2020-07-16 NOTE — Telephone Encounter (Signed)
   [facility] Pre-operative Risk Assessment    HEARTCARE STAFF: - Please ensure there is not already an duplicate clearance open for this procedure. - Under Visit Info/Reason for Call, type in Other and utilize the format Clearance MM/DD/YY or Clearance TBD. Do not use dashes or single digits. - If request is for dental extraction, please clarify the # of teeth to be extracted.  Request for surgical clearance:  1. What type of surgery is being performed? RIGHT INGUINAL HERNIA REPAIR w/MESH   2. When is this surgery scheduled? TBD   3. What type of clearance is required (medical clearance vs. Pharmacy clearance to hold med vs. Both)? MEDICAL  4. Are there any medications that need to be held prior to surgery and how long? PLAVIX   5. Practice name and name of physician performing surgery? [facility]; DR. Coralie Keens   6. What is the office phone number? [phone]   7.   What is the office fax number? [place]: Malachi Bonds, CMA  8.   Anesthesia type (None, local, MAC, general) ? GENERAL   Julaine Hua 07/16/2020, 10:51 AM  _________________________________________________________________   (provider comments below)

## 2020-07-16 NOTE — Telephone Encounter (Signed)
Left detailed message cardiac clearance to be discussed at upcoming appt 08/03/2020@11 :20 AM w/Dr Clifton James. No sooner appts available

## 2020-07-16 NOTE — Telephone Encounter (Signed)
Primary Cardiologist:Christopher Clifton James, MD  Chart reviewed as part of pre-operative protocol coverage. Because of Robert [name]'s past medical history and time since last visit, he/she will require a follow-up visit in order to better assess preoperative cardiovascular risk.  Pre-op covering staff: - Please schedule appointment and call patient to inform them. - Please contact requesting surgeon's office via preferred method (i.e, phone, fax) to inform them of need for appointment prior to surgery.  If applicable, this message will also be routed to pharmacy pool and/or primary cardiologist for input on holding anticoagulant/antiplatelet agent as requested below so that this information is available at time of patient's appointment.   Ronney Asters, NP  07/16/2020, 11:08 AM

## 2020-08-03 ENCOUNTER — Ambulatory Visit (INDEPENDENT_AMBULATORY_CARE_PROVIDER_SITE_OTHER): Payer: Medicare Other | Admitting: Cardiovascular Disease

## 2020-08-03 ENCOUNTER — Other Ambulatory Visit: Payer: Self-pay

## 2020-08-03 ENCOUNTER — Encounter: Payer: Self-pay | Admitting: Cardiovascular Disease

## 2020-08-03 VITALS — BP 126/72 | HR 80 | Ht 69.0 in | Wt 173.0 lb

## 2020-08-03 DIAGNOSIS — I10 Essential (primary) hypertension: Secondary | ICD-10-CM

## 2020-08-03 DIAGNOSIS — Z01810 Encounter for preprocedural cardiovascular examination: Secondary | ICD-10-CM

## 2020-08-03 DIAGNOSIS — I25118 Atherosclerotic heart disease of native coronary artery with other forms of angina pectoris: Secondary | ICD-10-CM

## 2020-08-03 NOTE — Progress Notes (Signed)
Chief Complaint  Patient presents with  . Follow-up    CAD   History of Present Illness: 79 yo male with history of CAD s/p CABG in 2008, HTN, HLD and subclavian artery stenosis who is here today for cardiac follow up. His CABG was in 2008 in [place]. Prior right subclavian artery stent in 2016. He underwent a nuclear stress in June 2019 which showed a fixed inferior defect and no ischemia. He began having chest pain in July 2019 and underwent a cardiac cath on 07/10/18 which showed 90% distal left main stenosis, occluded proximal LAD, 90% ostial Circumflex stenosis, 60% in stent restenosis RCA and severe distal RCA stenosis. LIMA to LAD patent. SVG to OM was patent. SVG to RCA and intermediate occluded. PCI of the RCA was performed with placement of 3 drug eluting stents. He then had staged PCI of the distal left main artery into the intermediate vessel with atherectomy and placement of 2 drug eluting stents. He was seen in our office 07/23/18 and had c/o continued chest pain. Admitted to [facility] 08/03/18 with chest pain and ruled out for MI. Imdur increased and he was discharged home on 08/04/18. I saw him in the office 08/06/18 and his pain had improved but he was still having mild pain. Ranexa was started but he did not tolerate. Echo 08/10/18 with LVEF=60-65%. Grade 1 diastolic dysfunction. There was mild mitral regurgitation.   He is here today for follow up. The patient denies any chest pain, dyspnea, palpitations, lower extremity edema, orthopnea, PND, dizziness, near syncope or syncope. He has an upcoming inguinal hernia repair.  Primary Care Physician: Georgann Housekeeper, MD  Past Medical History:  Diagnosis Date  . Arthritis    "a little in my fingers; never tx'd" (07/17/2018)  . CAD (coronary artery disease)    a. CABG X4 2008. b. MI and stent (in [place]) b. PCI/DES to RCA (07/12/18) c. PCI/DES to LM-ramus (07/17/18)  . Foot fracture, right 1996  . High cholesterol   . Hypertension   . Leg hematoma  01/2018   RLE; "no tx"  . Presence of arterial stent 2016   Right subclavian artery  . Skipped heart beats   . Stenosis of subclavian artery (HCC)    a. s/p R subclavian stent 2016.    Past Surgical History:  Procedure Laterality Date  . AORTIC ARCH ANGIOGRAPHY N/A 07/10/2018   Procedure: AORTIC ARCH ANGIOGRAPHY;  Surgeon: Marykay Lex, MD;  Location: [facility] INVASIVE CV LAB;  Service: Cardiovascular;  Laterality: N/A;  . CATARACT EXTRACTION W/ INTRAOCULAR LENS  IMPLANT, BILATERAL Bilateral   . CORONARY ANGIOGRAPHY N/A 07/18/2018   Procedure: CORONARY ANGIOGRAPHY;  Surgeon: Marykay Lex, MD;  Location: [facility] INVASIVE CV LAB;  Service: Cardiovascular;  Laterality: N/A;  . CORONARY ANGIOPLASTY WITH STENT PLACEMENT  2016  . CORONARY ARTERY BYPASS GRAFT  2008   "CABG X4"  . CORONARY ATHERECTOMY N/A 07/18/2018   Procedure: CORONARY ATHERECTOMY;  Surgeon: Marykay Lex, MD;  Location: [facility] INVASIVE CV LAB;  Service: Cardiovascular;  Laterality: N/A;  . CORONARY STENT INTERVENTION N/A 07/12/2018   Procedure: CORONARY STENT INTERVENTION;  Surgeon: Marykay Lex, MD;  Location: [facility] INVASIVE CV LAB;  Service: Cardiovascular;  Laterality: N/A;  . LEFT HEART CATH AND CORS/GRAFTS ANGIOGRAPHY N/A 07/10/2018   Procedure: LEFT HEART CATH AND CORS/GRAFTS ANGIOGRAPHY;  Surgeon: Marykay Lex, MD;  Location: [facility] INVASIVE CV LAB;  Service: Cardiovascular;  Laterality: N/A;  . PROSTATECTOMY  ~ 2012   "  removed most of it; no cancer" (07/17/2018)  . SUBCLAVIAN ARTERY STENT Right 2016    Current Outpatient Medications  Medication Sig Dispense Refill  . amLODipine (NORVASC) 10 MG tablet Take 1 tablet by mouth every day (Patient taking differently: Take 10 mg by mouth daily. ) 90 tablet 0  . Artificial Tear Ointment (DRY EYES OP) Place 1 drop into both eyes as needed (dry eyes).    Marland Kitchen. aspirin 81 MG EC tablet Take 81 mg by mouth at bedtime. Swallow whole.     Marland Kitchen. atorvastatin (LIPITOR) 80 MG tablet Take 1 tablet  by mouth every day at bedtime (Patient taking differently: Take 80 mg by mouth at bedtime. ) 90 tablet 0  . clopidogrel (PLAVIX) 75 MG tablet Take 1 tablet (75 mg total) by mouth daily. Please keep upcoming appt in August with Dr. Clifton JamesMcalhany for future refills. Thank you (Patient taking differently: Take 75 mg by mouth daily. ) 90 tablet 0  . Ergocalciferol (VITAMIN D2) 2000 units TABS Take 2,000 Units by mouth daily.     . hydrochlorothiazide (HYDRODIURIL) 25 MG tablet Take 1 tablet by mouth every day 90 tablet 0  . isosorbide mononitrate (IMDUR) 60 MG 24 hr tablet Take 1 tablet by mouth every day (Patient taking differently: Take 60 mg by mouth in the morning. ) 90 tablet 3  . metoprolol succinate (TOPROL-XL) 50 MG 24 hr tablet Take 1 and 1/2 tablets by mouth twice daily. Take with or immediately following a meal. (dose increase) (Patient taking differently: Take 75 mg by mouth 2 (two) times daily. Take with or immediately following a meal. (dose increase)) 270 tablet 0  . nitroGLYCERIN (NITROSTAT) 0.4 MG SL tablet Place 0.4 mg under the tongue every 5 (five) minutes as needed for chest pain.   1  . ramipril (ALTACE) 10 MG capsule Take 1 capsule by mouth every day (Patient taking differently: Take 10 mg by mouth at bedtime. ) 90 capsule 0   No current facility-administered medications for this visit.    No Known Allergies  Social History   Socioeconomic History  . Marital status: Married    Spouse name: Not on file  . Number of children: 2  . Years of education: COLLEGE  . Highest education level: Not on file  Occupational History  . Occupation: RETIRED  Tobacco Use  . Smoking status: Never Smoker  . Smokeless tobacco: Never Used  Vaping Use  . Vaping Use: Never used  Substance and Sexual Activity  . Alcohol use: Yes    Comment: 07/17/2018 "a couple drinks/year; if that"  . Drug use: Never  . Sexual activity: Yes  Other Topics Concern  . Not on file  Social History Narrative  .  Not on file   Social Determinants of Health   Financial Resource Strain:   . Difficulty of Paying Living Expenses:   Food Insecurity:   . Worried About Programme researcher, broadcasting/film/videounning Out of Food in the Last Year:   . Baristaan Out of Food in the Last Year:   Transportation Needs:   . Freight forwarderLack of Transportation (Medical):   Marland Kitchen. Lack of Transportation (Non-Medical):   Physical Activity:   . Days of Exercise per Week:   . Minutes of Exercise per Session:   Stress:   . Feeling of Stress :   Social Connections:   . Frequency of Communication with Friends and Family:   . Frequency of Social Gatherings with Friends and Family:   . Attends Religious Services:   .  Active Member of Clubs or Organizations:   . Attends Banker Meetings:   Marland Kitchen Marital Status:   Intimate Partner Violence:   . Fear of Current or Ex-Partner:   . Emotionally Abused:   Marland Kitchen Physically Abused:   . Sexually Abused:     Family History  Problem Relation Age of Onset  . Healthy Mother        lived to be 69  . CAD Father 71  . Diabetes Brother   . Hypertension Brother   . CAD Brother 110       CABG    Review of Systems:  As stated in the HPI and otherwise negative.   BP 126/72   Pulse 80   Ht 5\' 9"  (1.753 m)   Wt 173 lb (78.5 kg)   SpO2 98%   BMI 25.55 kg/m   Physical Examination:  General: Well developed, well nourished, NAD  HEENT: OP clear, mucus membranes moist  SKIN: warm, dry. No rashes. Neuro: No focal deficits  Musculoskeletal: Muscle strength 5/5 all ext  Psychiatric: Mood and affect normal  Neck: No JVD, no carotid bruits, no thyromegaly, no lymphadenopathy.  Lungs:Clear bilaterally, no wheezes, rhonci, crackles Cardiovascular: Regular rate and rhythm. No murmurs, gallops or rubs. Abdomen:Soft. Bowel sounds present. Non-tender.  Extremities: No lower extremity edema. Pulses are 2 + in the bilateral DP/PT.  EKG:  EKG is ordered today. The ekg ordered today demonstrates sinus, rate 80 bpm. Old inferior MI.    Recent Labs: 07/14/2020: ALT 30; BUN 15; Creatinine, Ser 1.03; Hemoglobin 16.6; Platelets 305; Potassium 4.1; Sodium 140   Lipid Panel    Component Value Date/Time   CHOL 108 07/18/2018 0425   TRIG 87 07/18/2018 0425   HDL 32 (L) 07/18/2018 0425   CHOLHDL 3.4 07/18/2018 0425   VLDL 17 07/18/2018 0425   LDLCALC 59 07/18/2018 0425     Wt Readings from Last 3 Encounters:  08/03/20 173 lb (78.5 kg)  07/14/20 177 lb (80.3 kg)  06/20/19 175 lb (79.4 kg)     Other studies Reviewed: Additional studies/ records that were reviewed today include: . Review of the above records demonstrates:    Assessment and Plan:   1. CAD s/p CABG with angina: He has severe CAD s/p CABG. He had several caths in 2019 with placement of stents in the RCA and left main artery into the intermediate branch. The LIMA to the LAD is patent and the SVG to the OM is patent. Echo August 2019 with normal LV systolic function. He has no chest pain.  Will continue ASA, Plavix, Imdur, statin and beta blocker. He did not tolerate Ranexa.   -Proceed with planned surgery. OK to hold Plavix.   2. PAD: He is s/p right subclavian artery stent  3. HTN: BP is well controlled. No changes.   4. HLD: Lipids are well controlled at last check. LDL 66 in July 2020. Continue statin  5. PVCs: Rare palpitations. Continue beta blocker  6. Pre-operative cardiovascular examination: He is having no active cardiac issues. He can achieve greater than 4 METS per day. EKG unchanged. He can proceed with his planned surgical procedure without further workup.   Current medicines are reviewed at length with the patient today.  The patient does not have concerns regarding medicines.  The following changes have been made:  no change  Labs/ tests ordered today include:   Orders Placed This Encounter  Procedures  . EKG 12-Lead     Disposition:  FU with me in  12  months   Signed, Verne Carrow, MD 08/03/2020 11:26 AM     [facility] [address] [place], [place], [place]  [address] Phone: [phone]; Fax: [phone]

## 2020-08-03 NOTE — Patient Instructions (Signed)
Medication Instructions:  Your provider recommends that you continue on your current medications as directed. Please refer to the Current Medication list given to you today.   *If you need a refill on your cardiac medications before your next appointment, please call your pharmacy*  Follow-Up: At [facility], you and your health needs are our priority.  As part of our continuing mission to provide you with exceptional heart care, we have created designated Provider Care Teams.  These Care Teams include your primary Cardiologist (physician) and Advanced Practice Providers (APPs -  Physician Assistants and Nurse Practitioners) who all work together to provide you with the care you need, when you need it. Your next appointment:   12 month(s) The format for your next appointment:   In Person Provider:   You may see Christopher McAlhany, MD or one of the following Advanced Practice Providers on your designated Care Team:    Scott Weaver, PA-C  Vin Bhagat, PA-C   

## 2020-08-06 ENCOUNTER — Other Ambulatory Visit (HOSPITAL_COMMUNITY): Payer: Medicare Other

## 2020-08-10 ENCOUNTER — Ambulatory Visit (HOSPITAL_BASED_OUTPATIENT_CLINIC_OR_DEPARTMENT_OTHER): Admit: 2020-08-10 | Payer: Medicare Other | Admitting: Surgery

## 2020-08-10 ENCOUNTER — Encounter (HOSPITAL_BASED_OUTPATIENT_CLINIC_OR_DEPARTMENT_OTHER): Payer: Self-pay

## 2020-08-10 SURGERY — REPAIR, HERNIA, INGUINAL, ADULT
Anesthesia: General | Laterality: Right

## 2020-08-14 ENCOUNTER — Other Ambulatory Visit: Payer: Self-pay | Admitting: Cardiovascular Disease

## 2020-08-19 ENCOUNTER — Other Ambulatory Visit: Payer: Self-pay

## 2020-08-19 ENCOUNTER — Encounter (HOSPITAL_BASED_OUTPATIENT_CLINIC_OR_DEPARTMENT_OTHER): Payer: Self-pay | Admitting: Surgery

## 2020-08-25 ENCOUNTER — Other Ambulatory Visit (HOSPITAL_COMMUNITY)
Admission: RE | Admit: 2020-08-25 | Discharge: 2020-08-25 | Disposition: A | Discharge status: Home / Self Care | Payer: Medicare Other | Source: Ambulatory Visit | Attending: Surgery | Admitting: Surgery

## 2020-08-25 ENCOUNTER — Encounter (HOSPITAL_BASED_OUTPATIENT_CLINIC_OR_DEPARTMENT_OTHER)
Admission: RE | Admit: 2020-08-25 | Discharge: 2020-08-25 | Disposition: A | Discharge status: Home / Self Care | Payer: Medicare Other | Source: Ambulatory Visit | Attending: Surgery | Admitting: Surgery

## 2020-08-25 DIAGNOSIS — Z20822 Contact with and (suspected) exposure to covid-19: Secondary | ICD-10-CM | POA: Insufficient documentation

## 2020-08-25 DIAGNOSIS — M1990 Unspecified osteoarthritis, unspecified site: Secondary | ICD-10-CM | POA: Diagnosis not present

## 2020-08-25 DIAGNOSIS — I10 Essential (primary) hypertension: Secondary | ICD-10-CM | POA: Diagnosis not present

## 2020-08-25 DIAGNOSIS — K4090 Unilateral inguinal hernia, without obstruction or gangrene, not specified as recurrent: Secondary | ICD-10-CM | POA: Diagnosis present

## 2020-08-25 DIAGNOSIS — Z01812 Encounter for preprocedural laboratory examination: Secondary | ICD-10-CM | POA: Diagnosis not present

## 2020-08-25 DIAGNOSIS — E785 Hyperlipidemia, unspecified: Secondary | ICD-10-CM | POA: Diagnosis not present

## 2020-08-25 DIAGNOSIS — I2510 Atherosclerotic heart disease of native coronary artery without angina pectoris: Secondary | ICD-10-CM | POA: Diagnosis not present

## 2020-08-25 DIAGNOSIS — Z955 Presence of coronary angioplasty implant and graft: Secondary | ICD-10-CM | POA: Diagnosis not present

## 2020-08-25 DIAGNOSIS — Z951 Presence of aortocoronary bypass graft: Secondary | ICD-10-CM | POA: Diagnosis not present

## 2020-08-25 LAB — BASIC METABOLIC PANEL
Anion gap: 9 (ref 5–15)
BUN: 16 mg/dL (ref 8–23)
CO2: 26 mmol/L (ref 22–32)
Calcium: 9.5 mg/dL (ref 8.9–10.3)
Chloride: 105 mmol/L (ref 98–111)
Creatinine, Ser: 0.89 mg/dL (ref 0.61–1.24)
GFR calc Af Amer: >60 mL/min (ref >60)
GFR calc non Af Amer: >60 mL/min (ref >60)
Glucose, Bld: 109 mg/dL — ABNORMAL HIGH (ref 70–99)
Potassium: 3.7 mmol/L (ref 3.5–5.1)
Sodium: 140 mmol/L (ref 135–145)

## 2020-08-25 LAB — SARS CORONAVIRUS 2 (TAT 6-24 HRS): SARS Coronavirus 2: NEGATIVE

## 2020-08-25 NOTE — Progress Notes (Signed)

## 2020-08-26 NOTE — H&P (Signed)
Chief Complaint/Reason for Consult: R inguinal hernia   HPI:  Patient is a 79 year old Robert [name] who presented to [facility] with complaint of right inguinal hernia. He reports it has gotten worse over the last several months. He reports he has a truss belt but finds it uncomfortable. Some mild right groin pain but hernia is reducible. Patient reports some chronic constipation but has taken miralax for this in the past with good success. Denies fever, chills, chest pain, SOB, abdominal pain, nausea, urinary symptoms. PMH significant for CAD s/p CABG in 2008 and stents in 2019, PAD, HTN, HLD. NKDA. Patient drinks on rare occasions, denies tobacco or illicit drug use. Has had both COVID vaccinations.   ROS: Review of Systems  Constitutional: Negative for chills, fever and weight loss.  Respiratory: Negative for shortness of breath and wheezing.   Cardiovascular: Negative for chest pain and palpitations.  Gastrointestinal: Positive for constipation (chronic). Negative for abdominal pain, blood in stool, diarrhea, melena, nausea and vomiting.  Genitourinary: Negative for dysuria, frequency, hematuria and urgency.       Right groin pain and swelling.   All other systems reviewed and are negative.        Family History  Problem Relation Age of Onset  . Healthy Mother        lived to be 58  . CAD Father 31  . Diabetes Brother   . Hypertension Brother   . CAD Brother 22       CABG        Past Medical History:  Diagnosis Date  . Arthritis    "a little in my fingers; never tx'd" (07/17/2018)  . CAD (coronary artery disease)    a. CABG X4 2008. b. MI and stent (in [place]) b. PCI/DES to RCA (07/12/18) c. PCI/DES to LM-ramus (07/17/18)  . Foot fracture, right 1996  . High cholesterol   . Hypertension   . Leg hematoma 01/2018   RLE; "no tx"  . Presence of arterial stent 2016   Right subclavian artery  . Skipped heart beats   . Stenosis of subclavian artery (HCC)    a. s/p R  subclavian stent 2016.         Past Surgical History:  Procedure Laterality Date  . AORTIC ARCH ANGIOGRAPHY N/A 07/10/2018   Procedure: AORTIC ARCH ANGIOGRAPHY;  Surgeon: Marykay Lex, MD;  Location: [facility] INVASIVE CV LAB;  Service: Cardiovascular;  Laterality: N/A;  . CATARACT EXTRACTION W/ INTRAOCULAR LENS  IMPLANT, BILATERAL Bilateral   . CORONARY ANGIOGRAPHY N/A 07/18/2018   Procedure: CORONARY ANGIOGRAPHY;  Surgeon: Marykay Lex, MD;  Location: [facility] INVASIVE CV LAB;  Service: Cardiovascular;  Laterality: N/A;  . CORONARY ANGIOPLASTY WITH STENT PLACEMENT  2016  . CORONARY ARTERY BYPASS GRAFT  2008   "CABG X4"  . CORONARY ATHERECTOMY N/A 07/18/2018   Procedure: CORONARY ATHERECTOMY;  Surgeon: Marykay Lex, MD;  Location: [facility] INVASIVE CV LAB;  Service: Cardiovascular;  Laterality: N/A;  . CORONARY STENT INTERVENTION N/A 07/12/2018   Procedure: CORONARY STENT INTERVENTION;  Surgeon: Marykay Lex, MD;  Location: [facility] INVASIVE CV LAB;  Service: Cardiovascular;  Laterality: N/A;  . LEFT HEART CATH AND CORS/GRAFTS ANGIOGRAPHY N/A 07/10/2018   Procedure: LEFT HEART CATH AND CORS/GRAFTS ANGIOGRAPHY;  Surgeon: Marykay Lex, MD;  Location: [facility] INVASIVE CV LAB;  Service: Cardiovascular;  Laterality: N/A;  . PROSTATECTOMY  ~ 2012   "removed most of it; no cancer" (07/17/2018)  . SUBCLAVIAN ARTERY STENT Right 2016  Social History:  reports that he has never smoked. He has never used smokeless tobacco. He reports current alcohol use. He reports that he does not use drugs.  Allergies: No Known Allergies  (Not in a hospital admission)   Blood pressure (!) 118/91, pulse 84, temperature 98.4 F (36.9 C), temperature source Oral, resp. rate 14, height 5\' 9"  (1.753 m), weight 80.3 kg, SpO2 96 %. Physical Exam:  General: pleasant, WD, WN white Robert [name] who is laying in bed in NAD HEENT:  Sclera are noninjected.  PERRL.  Ears and nose without any masses or lesions.  Mouth is  pink and moist Heart: regular, rate, and rhythm.  Normal s1,s2. No obvious murmurs, gallops, or rubs noted.  Palpable radial and pedal pulses bilaterally Lungs: CTAB, no wheezes, rhonchi, or rales noted.  Respiratory effort nonlabored Abd: soft, NT, ND, +BS GU: right inguinal hernia easily reducible  MS: all 4 extremities are symmetrical with no cyanosis, clubbing, or edema. Skin: warm and dry with no masses, lesions, or rashes Neuro: Cranial nerves 2-12 grossly intact, sensation grossly intact throughout Psych: A&Ox3 with an appropriate affect.   Results for orders placed or performed during the hospital encounter of 07/14/20 (from the past 48 hour(s))  Lipase, blood     Status: None   Collection Time: 07/14/20 10:48 AM  Result Value Ref Range   Lipase 24 11 - 51 U/L    Comment: Performed at [facility], [address] [place], [place]  Comprehensive metabolic panel     Status: Abnormal   Collection Time: 07/14/20 10:48 AM  Result Value Ref Range   Sodium 140 135 - 145 mmol/L   Potassium 4.1 3.5 - 5.1 mmol/L   Chloride 102 98 - 111 mmol/L   CO2 27 22 - 32 mmol/L   Glucose, Bld 111 (H) 70 - 99 mg/dL    Comment: Glucose reference range applies only to samples taken after fasting for at least 8 hours.   BUN 15 8 - 23 mg/dL   Creatinine, Ser 07/16/20 0.61 - 1.24 mg/dL   Calcium 9.5 8.9 - 0.86 mg/dL   Total Protein 6.3 (L) 6.5 - 8.1 g/dL   Albumin 3.7 3.5 - 5.0 g/dL   AST 36 15 - 41 U/L   ALT 30 0 - 44 U/L   Alkaline Phosphatase 63 Robert - 126 U/L   Total Bilirubin 1.8 (H) 0.3 - 1.2 mg/dL   GFR calc non Af Amer >60 >60 mL/min   GFR calc Af Amer >60 >60 mL/min   Anion gap 11 5 - 15    Comment: Performed at [facility], [address] [place], [place]  CBC     Status: None   Collection Time: 07/14/20 10:48 AM  Result Value Ref Range   WBC 10.4 4.0 - 10.5 K/uL   RBC 5.55 4.22 - 5.81 MIL/uL   Hemoglobin 16.6 13.0 - 17.0  g/dL   HCT 07/16/20 39 - 52 %   MCV 90.8 80.0 - 100.0 fL   MCH 29.9 26.0 - 34.0 pg   MCHC 32.9 30.0 - 36.0 g/dL   RDW 95.2 84.1 - 32.4 %   Platelets 305 150 - 400 K/uL   nRBC 0.0 0.0 - 0.2 %    Comment: Performed at [facility], [address], [place], [place]   No results found.    Assessment/Plan   Right inguinal hernia  We discussed the diagnosis.  An open right inguinal hernia repair with mesh is recommended.  I discussed the risks which include but are not limited to bleeding, infection, injury to surrounding structures, use of mesh, never entrapment, chronic pain, hernia recurrence, cardiopulmonary issues, post op recovery, etc. He has been cleared by cardiology for surgery. He agrees to proceed.

## 2020-08-26 NOTE — Anesthesia Preprocedure Evaluation (Addendum)
Anesthesia Evaluation  Patient identified by MRN, date of birth, ID band Patient awake    Reviewed: Allergy & Precautions, NPO status , Patient's Chart, lab work & pertinent test results, reviewed documented beta blocker date and time   History of Anesthesia Complications Negative for: history of anesthetic complications  Airway Mallampati: II  TM Distance: >3 FB Neck ROM: Full    Dental no notable dental hx.    Pulmonary neg pulmonary ROS,    Pulmonary exam normal        Cardiovascular hypertension, Pt. on medications and Pt. on home beta blockers + CAD, + Past MI, + Cardiac Stents (2019), + CABG (2008, on Plavix) and + Peripheral Vascular Disease  Normal cardiovascular exam  TTE 2019: moderate LVH, EF 60-65%, grade 1 DD, mild MR, mild TR, mild PR, PASP mildly elevated    Neuro/Psych negative neurological ROS  negative psych ROS   GI/Hepatic Neg liver ROS, Right inguinal hernia   Endo/Other  negative endocrine ROS  Renal/GU negative Renal ROS  negative genitourinary   Musculoskeletal  (+) Arthritis ,   Abdominal   Peds  Hematology negative hematology ROS (+)   Anesthesia Other Findings Day of surgery medications reviewed with patient.  Reproductive/Obstetrics negative OB ROS                            Anesthesia Physical Anesthesia Plan  ASA: III  Anesthesia Plan: General   Post-op Pain Management: GA combined w/ Regional for post-op pain   Induction: Intravenous  PONV Risk Score and Plan: 2 and Treatment may vary due to age or medical condition, Ondansetron and Dexamethasone  Airway Management Planned: LMA  Additional Equipment: None  Intra-op Plan:   Post-operative Plan: Extubation in OR  Informed Consent: I have reviewed the patients History and Physical, chart, labs and discussed the procedure including the risks, benefits and alternatives for the proposed  anesthesia with the patient or authorized representative who has indicated his/her understanding and acceptance.     Dental advisory given  Plan Discussed with: CRNA  Anesthesia Plan Comments:        Anesthesia Quick Evaluation

## 2020-08-27 ENCOUNTER — Ambulatory Visit (HOSPITAL_COMMUNITY)
Admission: RE | Admit: 2020-08-27 | Discharge: 2020-08-27 | Disposition: A | Discharge status: Home / Self Care | Payer: Medicare Other | Attending: Surgery | Admitting: Surgery

## 2020-08-27 ENCOUNTER — Encounter (HOSPITAL_BASED_OUTPATIENT_CLINIC_OR_DEPARTMENT_OTHER)
Admission: RE | Disposition: A | Discharge status: Home / Self Care | Payer: Self-pay | Source: Home / Self Care | Attending: Surgery

## 2020-08-27 ENCOUNTER — Encounter (HOSPITAL_BASED_OUTPATIENT_CLINIC_OR_DEPARTMENT_OTHER): Payer: Self-pay | Admitting: Surgery

## 2020-08-27 ENCOUNTER — Ambulatory Visit (HOSPITAL_BASED_OUTPATIENT_CLINIC_OR_DEPARTMENT_OTHER): Payer: Medicare Other | Admitting: Anesthesiology

## 2020-08-27 ENCOUNTER — Other Ambulatory Visit: Payer: Self-pay

## 2020-08-27 DIAGNOSIS — I2510 Atherosclerotic heart disease of native coronary artery without angina pectoris: Secondary | ICD-10-CM | POA: Insufficient documentation

## 2020-08-27 DIAGNOSIS — Z951 Presence of aortocoronary bypass graft: Secondary | ICD-10-CM | POA: Diagnosis not present

## 2020-08-27 DIAGNOSIS — K4090 Unilateral inguinal hernia, without obstruction or gangrene, not specified as recurrent: Secondary | ICD-10-CM | POA: Diagnosis not present

## 2020-08-27 DIAGNOSIS — Z955 Presence of coronary angioplasty implant and graft: Secondary | ICD-10-CM | POA: Insufficient documentation

## 2020-08-27 DIAGNOSIS — M1990 Unspecified osteoarthritis, unspecified site: Secondary | ICD-10-CM | POA: Insufficient documentation

## 2020-08-27 DIAGNOSIS — I10 Essential (primary) hypertension: Secondary | ICD-10-CM | POA: Insufficient documentation

## 2020-08-27 DIAGNOSIS — E785 Hyperlipidemia, unspecified: Secondary | ICD-10-CM | POA: Insufficient documentation

## 2020-08-27 DIAGNOSIS — G8918 Other acute postprocedural pain: Secondary | ICD-10-CM | POA: Diagnosis not present

## 2020-08-27 DIAGNOSIS — I25110 Atherosclerotic heart disease of native coronary artery with unstable angina pectoris: Secondary | ICD-10-CM | POA: Diagnosis not present

## 2020-08-27 HISTORY — PX: INGUINAL HERNIA REPAIR: SHX194

## 2020-08-27 SURGERY — REPAIR, HERNIA, INGUINAL, ADULT
Anesthesia: General | Site: Groin | Laterality: Right

## 2020-08-27 MED ORDER — OXYCODONE HCL 5 MG/5ML PO SOLN: 5.0000 mg | Freq: Once | ORAL | Status: DC | PRN

## 2020-08-27 MED ORDER — OXYCODONE HCL 5 MG PO TABS: 5.0000 mg | ORAL_TABLET | Freq: Once | ORAL | Status: DC | PRN

## 2020-08-27 MED ORDER — TRAMADOL HCL 50 MG PO TABS: 50.0000 mg | ORAL_TABLET | Freq: Four times a day (QID) | ORAL | 0 refills | Status: DC | PRN

## 2020-08-27 MED ORDER — CEFAZOLIN SODIUM-DEXTROSE 2-4 GM/100ML-% IV SOLN
INTRAVENOUS | Status: AC
  Filled 2020-08-27: qty 100

## 2020-08-27 MED ORDER — ONDANSETRON HCL 4 MG/2ML IJ SOLN
INTRAMUSCULAR | Status: AC
  Filled 2020-08-27: qty 2

## 2020-08-27 MED ORDER — ACETAMINOPHEN 500 MG PO TABS
1000.0000 mg | ORAL_TABLET | ORAL | Status: AC
  Administered 2020-08-27: 1000 mg via ORAL

## 2020-08-27 MED ORDER — PROPOFOL 500 MG/50ML IV EMUL
INTRAVENOUS | Status: AC
  Filled 2020-08-27: qty 50

## 2020-08-27 MED ORDER — BUPIVACAINE-EPINEPHRINE (PF) 0.5% -1:200000 IJ SOLN
INTRAMUSCULAR | Status: DC | PRN
  Administered 2020-08-27: 20 mL via PERINEURAL

## 2020-08-27 MED ORDER — PROPOFOL 10 MG/ML IV BOLUS
INTRAVENOUS | Status: DC | PRN
  Administered 2020-08-27: 140 mg via INTRAVENOUS

## 2020-08-27 MED ORDER — MIDAZOLAM HCL 2 MG/2ML IJ SOLN
INTRAMUSCULAR | Status: AC
  Filled 2020-08-27: qty 2

## 2020-08-27 MED ORDER — ONDANSETRON HCL 4 MG/2ML IJ SOLN
INTRAMUSCULAR | Status: DC | PRN
  Administered 2020-08-27: 4 mg via INTRAVENOUS

## 2020-08-27 MED ORDER — EPHEDRINE SULFATE-NACL 50-0.9 MG/10ML-% IV SOSY
PREFILLED_SYRINGE | INTRAVENOUS | Status: DC | PRN
  Administered 2020-08-27 (×2): 10 mg via INTRAVENOUS

## 2020-08-27 MED ORDER — CEFAZOLIN SODIUM-DEXTROSE 2-4 GM/100ML-% IV SOLN
2.0000 g | INTRAVENOUS | Status: AC
  Administered 2020-08-27: 2 g via INTRAVENOUS

## 2020-08-27 MED ORDER — PROMETHAZINE HCL 25 MG/ML IJ SOLN: 6.2500 mg | INTRAMUSCULAR | Status: DC | PRN

## 2020-08-27 MED ORDER — ACETAMINOPHEN 500 MG PO TABS
ORAL_TABLET | ORAL | Status: AC
  Filled 2020-08-27: qty 2

## 2020-08-27 MED ORDER — FENTANYL CITRATE (PF) 100 MCG/2ML IJ SOLN
INTRAMUSCULAR | Status: DC | PRN
Start: 2020-08-27 — End: 2020-08-27
  Administered 2020-08-27: 100 ug via INTRAVENOUS

## 2020-08-27 MED ORDER — CHLORHEXIDINE GLUCONATE CLOTH 2 % EX PADS: 6.0000 | MEDICATED_PAD | Freq: Once | CUTANEOUS | Status: DC

## 2020-08-27 MED ORDER — GABAPENTIN 100 MG PO CAPS
100.0000 mg | ORAL_CAPSULE | ORAL | Status: AC
  Administered 2020-08-27: 100 mg via ORAL

## 2020-08-27 MED ORDER — LACTATED RINGERS IV SOLN: INTRAVENOUS | Status: DC

## 2020-08-27 MED ORDER — MIDAZOLAM HCL 2 MG/2ML IJ SOLN: 1.0000 mg | Freq: Once | INTRAMUSCULAR | Status: DC

## 2020-08-27 MED ORDER — DEXAMETHASONE SODIUM PHOSPHATE 10 MG/ML IJ SOLN
INTRAMUSCULAR | Status: AC
  Filled 2020-08-27: qty 1

## 2020-08-27 MED ORDER — FENTANYL CITRATE (PF) 100 MCG/2ML IJ SOLN
INTRAMUSCULAR | Status: AC
  Filled 2020-08-27: qty 2

## 2020-08-27 MED ORDER — FENTANYL CITRATE (PF) 100 MCG/2ML IJ SOLN
50.0000 ug | Freq: Once | INTRAMUSCULAR | Status: AC
  Administered 2020-08-27: 50 ug via INTRAVENOUS

## 2020-08-27 MED ORDER — BUPIVACAINE HCL (PF) 0.5 % IJ SOLN
INTRAMUSCULAR | Status: DC | PRN
  Administered 2020-08-27: 10 mL

## 2020-08-27 MED ORDER — FENTANYL CITRATE (PF) 100 MCG/2ML IJ SOLN: 25.0000 ug | INTRAMUSCULAR | Status: DC | PRN

## 2020-08-27 MED ORDER — GABAPENTIN 100 MG PO CAPS
ORAL_CAPSULE | ORAL | Status: AC
  Filled 2020-08-27: qty 1

## 2020-08-27 MED ORDER — LIDOCAINE 2% (20 MG/ML) 5 ML SYRINGE
INTRAMUSCULAR | Status: AC
  Filled 2020-08-27: qty 5

## 2020-08-27 MED ORDER — LIDOCAINE 2% (20 MG/ML) 5 ML SYRINGE
INTRAMUSCULAR | Status: DC | PRN
  Administered 2020-08-27: 80 mg via INTRAVENOUS

## 2020-08-27 MED ORDER — PHENYLEPHRINE 40 MCG/ML (10ML) SYRINGE FOR IV PUSH (FOR BLOOD PRESSURE SUPPORT)
PREFILLED_SYRINGE | INTRAVENOUS | Status: DC | PRN
  Administered 2020-08-27: 120 ug via INTRAVENOUS
  Administered 2020-08-27 (×3): 80 ug via INTRAVENOUS

## 2020-08-27 MED ORDER — BUPIVACAINE LIPOSOME 1.3 % IJ SUSP
INTRAMUSCULAR | Status: DC | PRN
  Administered 2020-08-27: 10 mL via PERINEURAL

## 2020-08-27 MED ORDER — DEXAMETHASONE SODIUM PHOSPHATE 10 MG/ML IJ SOLN
INTRAMUSCULAR | Status: DC | PRN
  Administered 2020-08-27: 10 mg via INTRAVENOUS

## 2020-08-27 SURGICAL SUPPLY — 47 items
ADH SKN CLS APL DERMABOND .7 (GAUZE/BANDAGES/DRESSINGS) ×1
APL PRP STRL LF DISP 70% ISPRP (MISCELLANEOUS) ×1
BLADE CLIPPER SURG (BLADE) ×3 IMPLANT
BLADE SURG 15 STRL LF DISP TIS (BLADE) ×1 IMPLANT
BLADE SURG 15 STRL SS (BLADE) ×3
CANISTER SUCT 1200ML W/VALVE (MISCELLANEOUS) IMPLANT
CHLORAPREP W/TINT 26 (MISCELLANEOUS) ×3 IMPLANT
COVER BACK TABLE 60X90IN (DRAPES) ×3 IMPLANT
COVER MAYO STAND STRL (DRAPES) ×3 IMPLANT
COVER WAND RF STERILE (DRAPES) IMPLANT
DECANTER SPIKE VIAL GLASS SM (MISCELLANEOUS) IMPLANT
DERMABOND ADVANCED (GAUZE/BANDAGES/DRESSINGS) ×2
DERMABOND ADVANCED .7 DNX12 (GAUZE/BANDAGES/DRESSINGS) ×1 IMPLANT
DRAIN PENROSE 1/2X12 LTX STRL (WOUND CARE) ×3 IMPLANT
DRAPE LAPAROTOMY 100X72 PEDS (DRAPES) ×3 IMPLANT
DRAPE UTILITY XL STRL (DRAPES) ×3 IMPLANT
ELECT REM PT RETURN 9FT ADLT (ELECTROSURGICAL) ×3
ELECTRODE REM PT RTRN 9FT ADLT (ELECTROSURGICAL) ×1 IMPLANT
GLOVE BIO SURGEON STRL SZ 6.5 (GLOVE) ×2 IMPLANT
GLOVE BIO SURGEONS STRL SZ 6.5 (GLOVE) ×1
GLOVE BIOGEL PI IND STRL 6.5 (GLOVE) ×1 IMPLANT
GLOVE BIOGEL PI INDICATOR 6.5 (GLOVE) ×2
GLOVE SURG SIGNA 7.5 PF LTX (GLOVE) ×3 IMPLANT
GOWN STRL REUS W/ TWL LRG LVL3 (GOWN DISPOSABLE) ×1 IMPLANT
GOWN STRL REUS W/ TWL XL LVL3 (GOWN DISPOSABLE) ×1 IMPLANT
GOWN STRL REUS W/TWL LRG LVL3 (GOWN DISPOSABLE) ×3
GOWN STRL REUS W/TWL XL LVL3 (GOWN DISPOSABLE) ×3
MESH PARIETEX PROGRIP RIGHT (Mesh General) ×3 IMPLANT
NEEDLE HYPO 25X1 1.5 SAFETY (NEEDLE) ×3 IMPLANT
NS IRRIG 1000ML POUR BTL (IV SOLUTION) ×3 IMPLANT
PACK BASIN DAY SURGERY FS (CUSTOM PROCEDURE TRAY) ×3 IMPLANT
PENCIL SMOKE EVACUATOR (MISCELLANEOUS) ×3 IMPLANT
SLEEVE SCD COMPRESS KNEE MED (MISCELLANEOUS) ×3 IMPLANT
SPONGE INTESTINAL PEANUT (DISPOSABLE) IMPLANT
SPONGE LAP 4X18 RFD (DISPOSABLE) ×3 IMPLANT
SUT MNCRL AB 4-0 PS2 18 (SUTURE) ×3 IMPLANT
SUT SILK 2 0 SH (SUTURE) ×3 IMPLANT
SUT VIC AB 2-0 CT1 27 (SUTURE) ×6
SUT VIC AB 2-0 CT1 TAPERPNT 27 (SUTURE) ×2 IMPLANT
SUT VIC AB 3-0 CT1 27 (SUTURE) ×3
SUT VIC AB 3-0 CT1 27XBRD (SUTURE) ×1 IMPLANT
SYR BULB EAR ULCER 3OZ GRN STR (SYRINGE) IMPLANT
SYR CONTROL 10ML LL (SYRINGE) ×3 IMPLANT
TOWEL GREEN STERILE FF (TOWEL DISPOSABLE) ×3 IMPLANT
TUBE CONNECTING 20'X1/4 (TUBING)
TUBE CONNECTING 20X1/4 (TUBING) IMPLANT
YANKAUER SUCT BULB TIP NO VENT (SUCTIONS) IMPLANT

## 2020-08-27 NOTE — Anesthesia Procedure Notes (Signed)
Procedure Name: LMA Insertion Date/Time: 08/27/2020 10:50 AM Performed by: Pearson Grippe, CRNA Pre-anesthesia Checklist: Patient identified, Emergency Drugs available, Suction available and Patient being monitored Patient Re-evaluated:Patient Re-evaluated prior to induction Oxygen Delivery Method: Circle system utilized Preoxygenation: Pre-oxygenation with 100% oxygen Induction Type: IV induction Ventilation: Mask ventilation without difficulty LMA: LMA inserted LMA Size: 4.0 Number of attempts: 1 Airway Equipment and Method: Bite block Placement Confirmation: positive ETCO2 Tube secured with: Tape Dental Injury: Teeth and Oropharynx as per pre-operative assessment

## 2020-08-27 NOTE — Discharge Instructions (Signed)
[facility] _______[facility]  UMBILICAL OR INGUINAL HERNIA REPAIR: POST OP INSTRUCTIONS  Always review your discharge instruction sheet given to you by the facility where your surgery was performed. IF YOU HAVE DISABILITY OR FAMILY LEAVE FORMS, YOU MUST BRING THEM TO THE OFFICE FOR PROCESSING.   DO NOT GIVE THEM TO YOUR DOCTOR.  1. A  prescription for pain medication may be given to you upon discharge.  Take your pain medication as prescribed, if needed.  If narcotic pain medicine is not needed, then you may take acetaminophen (Tylenol) or ibuprofen (Advil) as needed. 2. Take your usually prescribed medications unless otherwise directed. If you need a refill on your pain medication, please contact your pharmacy.  They will contact our office to request authorization. Prescriptions will not be filled after 5 pm or on week-ends. 3. You should follow a light diet the first 24 hours after arrival home, such as soup and crackers, etc.  Be sure to include lots of fluids daily.  Resume your normal diet the day after surgery. 4.Most patients will experience some swelling and bruising around the umbilicus or in the groin and scrotum.  Ice packs and reclining will help.  Swelling and bruising can take several days to resolve.  6. It is common to experience some constipation if taking pain medication after surgery.  Increasing fluid intake and taking a stool softener (such as Colace) will usually help or prevent this problem from occurring.  A mild laxative (Milk of Magnesia or Miralax) should be taken according to package directions if there are no bowel movements after 48 hours. 7. Unless discharge instructions indicate otherwise, you may remove your bandages 24-48 hours after surgery, and you may shower at that time.  You may have steri-strips (small skin tapes) in place directly over the incision.  These strips should be left on the skin for 7-10 days.  If your surgeon used skin glue on the  incision, you may shower in 24 hours.  The glue will flake off over the next 2-3 weeks.  Any sutures or staples will be removed at the office during your follow-up visit. 8. ACTIVITIES:  You may resume regular (light) daily activities beginning the next day--such as daily self-care, walking, climbing stairs--gradually increasing activities as tolerated.  You may have sexual intercourse when it is comfortable.  Refrain from any heavy lifting or straining until approved by your doctor.  a.You may drive when you are no longer taking prescription pain medication, you can comfortably wear a seatbelt, and you can safely maneuver your car and apply brakes. b.RETURN TO WORK:   _____________________________________________  9.You should see your doctor in the office for a follow-up appointment approximately 2-3 weeks after your surgery.  Make sure that you call for this appointment within a day or two after you arrive home to insure a convenient appointment time. 10.OTHER INSTRUCTIONS: YOU MAY SHOWER STARTING TOMORROW ICE PACK, TYLENOL ALSO FOR PAIN NO LIFTING MORE THAN 15 POUNDS FOR 4 WEEKS_________________________    _____________________________________  WHEN TO CALL YOUR DOCTOR: 1. Fever over 101.0 2. Inability to urinate 3. Nausea and/or vomiting 4. Extreme swelling or bruising 5. Continued bleeding from incision. 6. Increased pain, redness, or drainage from the incision  The clinic staff is available to answer your questions during regular business hours.  Please don't hesitate to call and ask to speak to one of the nurses for clinical concerns.  If you have a medical emergency, go to the nearest emergency room  or call 911.  A surgeon from [facility] is always on call at the hospital   [address], [address], [place], [place]  [address] ?  [address] [address], [place], [place]   [address] [phone] ? [phone] ? FAX [phone] Web site:  [email or web address]   Post Anesthesia Home Care Instructions  Activity: Get plenty of rest for the remainder of the day. A responsible individual must stay with you for 24 hours following the procedure.  For the next 24 hours, DO NOT: -Drive a car -Advertising copywriter -Drink alcoholic beverages -Take any medication unless instructed by your physician -Make any legal decisions or sign important papers.  Meals: Start with liquid foods such as gelatin or soup. Progress to regular foods as tolerated. Avoid greasy, spicy, heavy foods. If nausea and/or vomiting occur, drink only clear liquids until the nausea and/or vomiting subsides. Call your physician if vomiting continues.  Special Instructions/Symptoms: Your throat may feel dry or sore from the anesthesia or the breathing tube placed in your throat during surgery. If this causes discomfort, gargle with warm salt water. The discomfort should disappear within 24 hours.  If you had a scopolamine patch placed behind your ear for the management of post- operative nausea and/or vomiting:  1. The medication in the patch is effective for 72 hours, after which it should be removed.  Wrap patch in a tissue and discard in the trash. Wash hands thoroughly with soap and water. 2. You may remove the patch earlier than 72 hours if you experience unpleasant side effects which may include dry mouth, dizziness or visual disturbances. 3. Avoid touching the patch. Wash your hands with soap and water after contact with the patch.    Information for Discharge Teaching: EXPAREL (bupivacaine liposome injectable suspension)   Your surgeon or anesthesiologist gave you EXPAREL(bupivacaine) to help control your pain after surgery.   EXPAREL is a local anesthetic that provides pain relief by numbing the tissue around the surgical site.  EXPAREL is designed to release pain medication over time and can control pain for up to 72 hours.  Depending on how  you respond to EXPAREL, you may require less pain medication during your recovery.  Possible side effects:  Temporary loss of sensation or ability to move in the area where bupivacaine was injected.  Nausea, vomiting, constipation  Rarely, numbness and tingling in your mouth or lips, lightheadedness, or anxiety may occur.  Call your doctor right away if you think you may be experiencing any of these sensations, or if you have other questions regarding possible side effects.  Follow all other discharge instructions given to you by your surgeon or nurse. Eat a healthy diet and drink plenty of water or other fluids.  If you return to the hospital for any reason within 96 hours following the administration of EXPAREL, it is important for health care providers to know that you have received this anesthetic. A teal colored band has been placed on your arm with the date, time and amount of EXPAREL you have received in order to alert and inform your health care providers. Please leave this armband in place for the full 96 hours following administration, and then you may remove the band.  May take tylenol at 4pm, if needed.

## 2020-08-27 NOTE — Transfer of Care (Signed)
Immediate Anesthesia Transfer of Care Note  Patient: Robert [name]  Procedure(s) Performed: OPEN RIGHT INGUINAL HERNIA REPAIR WITH MESH (Right Groin)  Patient Location: PACU  Anesthesia Type:GA combined with regional for post-op pain  Level of Consciousness: awake, alert  and oriented  Airway & Oxygen Therapy: Patient Spontanous Breathing and Patient connected to face mask oxygen  Post-op Assessment: Report given to RN and Post -op Vital signs reviewed and stable  Post vital signs: Reviewed and stable  Last Vitals:  Vitals Value Taken Time  BP 106/59 08/27/20 1128  Temp    Pulse 68 08/27/20 1130  Resp 12 08/27/20 1130  SpO2 98 % 08/27/20 1130  Vitals shown include unvalidated device data.  Last Pain:  Vitals:   08/27/20 0940  TempSrc: Oral  PainSc: 0-No pain         Complications: No complications documented.

## 2020-08-27 NOTE — Progress Notes (Signed)
Assisted Dr. Howze with right, ultrasound guided, transabdominal plane block. Side rails up, monitors on throughout procedure. See vital signs in flow sheet. Tolerated Procedure well. 

## 2020-08-27 NOTE — Op Note (Signed)
OPEN RIGHT INGUINAL HERNIA REPAIR WITH MESH  Procedure Note  Cormac Wint 08/27/2020   Pre-op Diagnosis: RIGHT INGUINAL HERNIA     Post-op Diagnosis: same  Procedure(s): OPEN RIGHT INGUINAL HERNIA REPAIR WITH MESH  Surgeon(s): Abigail Miyamoto, MD  Anesthesia: General  Staff:  Circulator: Lenn Cal, RN Scrub Person: Francesco Sor, CST; Wardell Heath, CST Circulator Assistant: Maryan Rued, RN  Estimated Blood Loss: Minimal               Findings: The patient was found to have an indirect right inguinal hernia which was repaired with a piece of large Prolene ProGrip mesh from [employer]  Procedure: The patient was identified the preoperative holding area.  Anesthesiology provided a right tap block.  He was then taken to the operating room.  He was placed upon the operating table general anesthesia was induced.  His right lower quadrant was then prepped and draped in usual sterile fashion.  I anesthetized skin with Marcaine and then made a longitudinal incision with a scalpel.  I dissected down through Scarpa's fascia with electrocautery.  I then identified the external beak fascia and opened it toward the internal and external rings.  The testicular cord and structures along with a large indirect hernia sac was controlled the Penrose drain.  There was no evidence of direct hernia.  I was able to separate the cord structures from the hernia sac and dissect the sac down to the base.  I opened the sac and all contents were reduced into the abdominal cavity.  I then tied off the base of the sac with a silk suture and excised the redundant sac with the cautery.  The internal ring was quite large so I tightened it slightly with a 2-0 silk suture.  Next a large piece of Prolene ProGrip mesh was brought to the field.  I placed against the pubic tubercle and brought around the cord structures and secured in place to the inguinal floor.  I then sutured the mesh and circumferentially  with several 2-0 Vicryl sutures.  I then closed the external beak fascia over the top of the mesh with a running 2-0 Vicryl suture.  Scarpa's fascia was closed with 3-0 Vicryl sutures and skin was closed with running 4-0 Monocryl.  Dermabond was then applied.  The patient tolerated the procedure well.  All the counts were correct at the end of the procedure.  The patient was then extubated in the operating room and taken in a stable condition to the recovery room          Abigail Miyamoto   Date: 08/27/2020  Time: 11:24 AM

## 2020-08-27 NOTE — Interval H&P Note (Signed)
History and Physical Interval Note:no change in H and P  08/27/2020 10:36 AM  Robert [name]  has presented today for surgery, with the diagnosis of RIGHT INGUINAL HERNIA.  The various methods of treatment have been discussed with the patient and family. After consideration of risks, benefits and other options for treatment, the patient has consented to  Procedure(s): OPEN RIGHT INGUINAL HERNIA REPAIR WITH MESH (Right) as a surgical intervention.  The patient's history has been reviewed, patient examined, no change in status, stable for surgery.  I have reviewed the patient's chart and labs.  Questions were answered to the patient's satisfaction.     Abigail Miyamoto

## 2020-08-27 NOTE — Anesthesia Procedure Notes (Signed)
Anesthesia Regional Block: TAP block   Pre-Anesthetic Checklist: ,, timeout performed, Correct Patient, Correct Site, Correct Laterality, Correct Procedure, Correct Position, site marked, Risks and benefits discussed, pre-op evaluation,  At surgeon's request and post-op pain management  Laterality: Right  Prep: Maximum Sterile Barrier Precautions used, chloraprep       Needles:  Injection technique: Single-shot  Needle Type: Echogenic Stimulator Needle     Needle Length: 9cm  Needle Gauge: 21     Additional Needles:   Narrative:  Start time: 08/27/2020 10:29 AM End time: 08/27/2020 10:32 AM Injection made incrementally with aspirations every 5 mL. Anesthesiologist: Kaylyn Layer, MD  Additional Notes: Risks, benefits, and alternative discussed. Patient gave consent for procedure. Patient prepped and draped in sterile fashion. Sedation administered, patient remains easily responsive to voice. Relevant anatomy identified with ultrasound guidance. Local anesthetic given in 5cc increments with no signs or symptoms of intravascular injection. No pain or paraesthesias with injection. Patient monitored throughout procedure with signs of LAST or immediate complications. Tolerated well. Ultrasound image placed in chart. Amalia Greenhouse, MD

## 2020-08-27 NOTE — Anesthesia Postprocedure Evaluation (Signed)
Anesthesia Post Note  Patient: Robert [name]  Procedure(s) Performed: OPEN RIGHT INGUINAL HERNIA REPAIR WITH MESH (Right Groin)     Patient location during evaluation: PACU Anesthesia Type: General Level of consciousness: awake and alert and oriented Pain management: pain level controlled Vital Signs Assessment: post-procedure vital signs reviewed and stable Respiratory status: spontaneous breathing, nonlabored ventilation and respiratory function stable Cardiovascular status: blood pressure returned to baseline Postop Assessment: no apparent nausea or vomiting Anesthetic complications: no   No complications documented.  Last Vitals:  Vitals:   08/27/20 1215 08/27/20 1230  BP: 116/64 118/64  Pulse: 67 64  Resp: 12 15  Temp:    SpO2: 96% 92%    Last Pain:  Vitals:   08/27/20 1230  TempSrc:   PainSc: 0-No pain                 Kaylyn Layer

## 2020-08-28 ENCOUNTER — Telehealth: Payer: Self-pay | Admitting: Cardiovascular Disease

## 2020-08-28 ENCOUNTER — Encounter (HOSPITAL_BASED_OUTPATIENT_CLINIC_OR_DEPARTMENT_OTHER): Payer: Self-pay | Admitting: Surgery

## 2020-08-28 MED ORDER — ISOSORBIDE MONONITRATE ER 60 MG PO TB24
60.0000 mg | ORAL_TABLET | Freq: Every day | ORAL | 3 refills | Status: DC
Start: 2020-08-28 — End: 2021-08-10

## 2020-08-28 NOTE — Telephone Encounter (Signed)
°*  STAT* If patient is at the pharmacy, call can be transferred to refill team.   1. Which medications need to be refilled? (please list name of each medication and dose if known) isosorbide mononitrate (IMDUR) 60 MG 24 hr tabletV  2. Which pharmacy/location (including street and city if local pharmacy) is medication to be sent to? [id number] - [place], [place] - [address] AT [address] [facility] [address]  3. Do they need a 30 day or 90 day supply? 90 day

## 2020-08-28 NOTE — Telephone Encounter (Signed)
Pt' medication was sent to pt's pharmacy as requested. Confirmation received.  

## 2020-09-10 ENCOUNTER — Other Ambulatory Visit (HOSPITAL_COMMUNITY): Payer: Medicare Other

## 2020-10-03 ENCOUNTER — Other Ambulatory Visit: Payer: Self-pay | Admitting: Cardiovascular Disease

## 2020-10-21 DIAGNOSIS — Z23 Encounter for immunization: Secondary | ICD-10-CM | POA: Diagnosis not present

## 2020-11-16 ENCOUNTER — Other Ambulatory Visit: Payer: Self-pay

## 2020-11-16 ENCOUNTER — Telehealth: Payer: Self-pay | Admitting: Cardiovascular Disease

## 2020-11-16 MED ORDER — CLOPIDOGREL BISULFATE 75 MG PO TABS
75.0000 mg | ORAL_TABLET | Freq: Every day | ORAL | 2 refills | Status: DC
Start: 2020-11-16 — End: 2021-08-10

## 2020-11-16 NOTE — Telephone Encounter (Signed)
Pt's medication was already sent to pt's pharmacy as requested. Confirmation received.  

## 2020-11-16 NOTE — Telephone Encounter (Signed)
Pt's medication was sent to pt's pharmacy as requested. Confirmation received.  °

## 2020-11-16 NOTE — Telephone Encounter (Signed)
*  STAT* If patient is at the pharmacy, call can be transferred to refill team.   1. Which medications need to be refilled? (please list name of each medication and dose if known)  clopidogrel (PLAVIX) 75 MG tablet  2. Which pharmacy/location (including street and city if local pharmacy) is medication to be sent to? [id number] - [place], [place] - [address] [facility] [address]  3. Do they need a 30 day or 90 day supply? 90 day supply

## 2020-12-14 DIAGNOSIS — Z23 Encounter for immunization: Secondary | ICD-10-CM | POA: Diagnosis not present

## 2021-06-22 DIAGNOSIS — H0100A Unspecified blepharitis right eye, upper and lower eyelids: Secondary | ICD-10-CM | POA: Diagnosis not present

## 2021-06-22 DIAGNOSIS — H1820 Unspecified corneal edema: Secondary | ICD-10-CM | POA: Diagnosis not present

## 2021-06-22 DIAGNOSIS — H0100B Unspecified blepharitis left eye, upper and lower eyelids: Secondary | ICD-10-CM | POA: Diagnosis not present

## 2021-06-25 ENCOUNTER — Other Ambulatory Visit: Payer: Self-pay | Admitting: Cardiovascular Disease

## 2021-07-05 ENCOUNTER — Other Ambulatory Visit: Payer: Self-pay

## 2021-07-05 ENCOUNTER — Ambulatory Visit (INDEPENDENT_AMBULATORY_CARE_PROVIDER_SITE_OTHER): Payer: Medicare Other

## 2021-07-05 ENCOUNTER — Ambulatory Visit (HOSPITAL_COMMUNITY)
Admission: EM | Admit: 2021-07-05 | Discharge: 2021-07-05 | Disposition: A | Discharge status: Home / Self Care | Payer: Medicare Other | Attending: Urgent Care | Admitting: Urgent Care

## 2021-07-05 ENCOUNTER — Encounter (HOSPITAL_COMMUNITY): Payer: Self-pay

## 2021-07-05 DIAGNOSIS — M4312 Spondylolisthesis, cervical region: Secondary | ICD-10-CM | POA: Diagnosis not present

## 2021-07-05 DIAGNOSIS — M542 Cervicalgia: Secondary | ICD-10-CM

## 2021-07-05 DIAGNOSIS — M5030 Other cervical disc degeneration, unspecified cervical region: Secondary | ICD-10-CM

## 2021-07-05 MED ORDER — TIZANIDINE HCL 4 MG PO TABS: 4.0000 mg | ORAL_TABLET | Freq: Every day | ORAL | 0 refills | Status: DC

## 2021-07-05 MED ORDER — PREDNISONE 20 MG PO TABS: ORAL_TABLET | ORAL | 0 refills | Status: DC

## 2021-07-05 NOTE — ED Triage Notes (Signed)
Pt presents with neck and bilateral shoulder pain X 2 days with no known injury.

## 2021-07-05 NOTE — ED Provider Notes (Signed)
[name]   MRN: [id number] DOB: March 07, 1941  Subjective:   Robert [name] is a 80 y.o. male presenting for 2-day history of acute onset moderate persistent constant neck pain that radiates to both shoulders worse to the right side.  Denies fall, trauma, numbness or tingling, weakness.  Denies history of problems with his neck.  Has previously had some intermittent pains but not to this level.  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  No current facility-administered medications for this encounter.  Current Outpatient Medications:    amLODipine (NORVASC) 10 MG tablet, Take 1 tablet by mouth every day, Disp: 90 tablet, Rfl: 3   Artificial Tear Ointment (DRY EYES OP), Place 1 drop into both eyes as needed (dry eyes)., Disp: , Rfl:    aspirin 81 MG EC tablet, Take 81 mg by mouth at bedtime. Swallow whole. , Disp: , Rfl:    atorvastatin (LIPITOR) 80 MG tablet, Take 1 tablet by mouth every day at bedtime, Disp: 90 tablet, Rfl: 3   clopidogrel (PLAVIX) 75 MG tablet, Take 1 tablet (75 mg total) by mouth daily., Disp: 90 tablet, Rfl: 2   Ergocalciferol (VITAMIN D2) 2000 units TABS, Take 2,000 Units by mouth daily. , Disp: , Rfl:    hydrochlorothiazide (HYDRODIURIL) 25 MG tablet, Take 1 tablet (25 mg total) by mouth daily. Please schedule yearly appointment in August 2022 for future refills. Thank you, Disp: 90 tablet, Rfl: 0   isosorbide mononitrate (IMDUR) 60 MG 24 hr tablet, Take 1 tablet (60 mg total) by mouth daily., Disp: 90 tablet, Rfl: 3   metoprolol succinate (TOPROL-XL) 50 MG 24 hr tablet, TAKE 1 AND 1/2 TABLETS BY MOUTH TWICE DAILY. TAKE WITH OR IMMEDIATELY FOLLOWING A MEAL. (DOSE INCREASE), Disp: 270 tablet, Rfl: 3   nitroGLYCERIN (NITROSTAT) 0.4 MG SL tablet, Place 0.4 mg under the tongue every 5 (five) minutes as needed for chest pain. , Disp: , Rfl: 1   ramipril (ALTACE) 10 MG capsule, Take 1 capsule  by mouth every day, Disp: 90 capsule, Rfl: 3   traMADol (ULTRAM) 50 MG tablet, Take 1 tablet (50 mg total) by mouth every 6 (six) hours as needed for moderate pain or severe pain., Disp: 20 tablet, Rfl: 0   No Known Allergies  Past Medical History:  Diagnosis Date   Arthritis    "a little in my fingers; never tx'd" (07/17/2018)   CAD (coronary artery disease)    a. CABG X4 2008. b. MI and stent (in [place]) b. PCI/DES to RCA (07/12/18) c. PCI/DES to LM-ramus (07/17/18)   Foot fracture, right 1996   High cholesterol    Hypertension    Leg hematoma 01/2018   RLE; "no tx"   Presence of arterial stent 2016   Right subclavian artery   Skipped heart beats    Stenosis of subclavian artery (HCC)    a. s/p R subclavian stent 2016.     Past Surgical History:  Procedure Laterality Date   AORTIC ARCH ANGIOGRAPHY N/A 07/10/2018   Procedure: AORTIC ARCH ANGIOGRAPHY;  Surgeon: Marykay Lex, MD;  Location: [facility] INVASIVE CV LAB;  Service: Cardiovascular;  Laterality: N/A;   CATARACT EXTRACTION W/ INTRAOCULAR LENS  IMPLANT, BILATERAL Bilateral    CORONARY ANGIOGRAPHY N/A 07/18/2018   Procedure: CORONARY ANGIOGRAPHY;  Surgeon: Marykay Lex, MD;  Location: [facility] INVASIVE CV LAB;  Service: Cardiovascular;  Laterality: N/A;  CORONARY ANGIOPLASTY WITH STENT PLACEMENT  2016   CORONARY ARTERY BYPASS GRAFT  2008   "CABG X4"   CORONARY ATHERECTOMY N/A 07/18/2018   Procedure: CORONARY ATHERECTOMY;  Surgeon: Marykay Lex, MD;  Location: [facility] INVASIVE CV LAB;  Service: Cardiovascular;  Laterality: N/A;   CORONARY STENT INTERVENTION N/A 07/12/2018   Procedure: CORONARY STENT INTERVENTION;  Surgeon: Marykay Lex, MD;  Location: [facility] INVASIVE CV LAB;  Service: Cardiovascular;  Laterality: N/A;   INGUINAL HERNIA REPAIR Right 08/27/2020   Procedure: OPEN RIGHT INGUINAL HERNIA REPAIR WITH MESH;  Surgeon: Abigail Miyamoto, MD;  Location: [facility];  Service: General;  Laterality: Right;   LEFT HEART  CATH AND CORS/GRAFTS ANGIOGRAPHY N/A 07/10/2018   Procedure: LEFT HEART CATH AND CORS/GRAFTS ANGIOGRAPHY;  Surgeon: Marykay Lex, MD;  Location: [facility] INVASIVE CV LAB;  Service: Cardiovascular;  Laterality: N/A;   PROSTATECTOMY  ~ 2012   "removed most of it; no cancer" (07/17/2018)   SUBCLAVIAN ARTERY STENT Right 2016    Family History  Problem Relation Age of Onset   Healthy Mother        lived to be 21   CAD Father 20   Diabetes Brother    Hypertension Brother    CAD Brother 88       CABG    Social History   Tobacco Use   Smoking status: Never   Smokeless tobacco: Never  Vaping Use   Vaping Use: Never used  Substance Use Topics   Alcohol use: Not Currently    Comment: 07/17/2018 "a couple drinks/year; if that"   Drug use: Never    ROS   Objective:   Vitals: BP (!) 142/82 (BP Location: Right Arm)   Pulse 97   Temp 99.7 F (37.6 C) (Oral)   Resp 17   SpO2 97%   Physical Exam Constitutional:      General: He is not in acute distress.    Appearance: Normal appearance. He is well-developed and normal weight. He is not ill-appearing, toxic-appearing or diaphoretic.  HENT:     Head: Normocephalic and atraumatic.     Right Ear: External ear normal.     Left Ear: External ear normal.     Nose: Nose normal.     Mouth/Throat:     Pharynx: Oropharynx is clear.  Eyes:     General: No scleral icterus.       Right eye: No discharge.        Left eye: No discharge.     Extraocular Movements: Extraocular movements intact.     Pupils: Pupils are equal, round, and reactive to light.  Cardiovascular:     Rate and Rhythm: Normal rate.  Pulmonary:     Effort: Pulmonary effort is normal.  Musculoskeletal:     Cervical back: Spasms and tenderness present. No swelling, edema, deformity, erythema, signs of trauma, lacerations, rigidity, torticollis, bony tenderness or crepitus. Pain with movement present. Decreased range of motion.     Comments: Negative Spurling maneuver and  Lhermitte sign.  Neurological:     Mental Status: He is alert and oriented to person, place, and time.  Psychiatric:        Mood and Affect: Mood normal.        Behavior: Behavior normal.        Thought Content: Thought content normal.        Judgment: Judgment normal.    DG Cervical Spine Complete  Result Date: 07/05/2021 CLINICAL DATA:  Cervical neck pain and bilateral trapezius pain. Pain for 2 days. No known injury. EXAM: CERVICAL SPINE - COMPLETE 4+ VIEW COMPARISON:  None. FINDINGS: The bones are diffusely under mineralized. There is stepwise anterolisthesis of 4 mm at C3-C4, C4-C5, and C5-C6. Disc space narrowing and endplate spurring at C6-C7 and C7-T1. Lesser disc space narrowing at C5-C6. There is prominent multilevel facet hypertrophy. Suspected bony foraminal narrowing on the left at multiple levels, though incompletely assessed due to positioning. No evidence of acute osseous abnormality or fracture. No prevertebral soft tissue edema. Curvilinear structure projecting over the soft tissues in the midline is presumably external to the patient. IMPRESSION: 1. No acute osseous abnormality. 2. Multilevel degenerative disc disease and facet hypertrophy throughout the cervical spine with stepwise anterolisthesis of C3-C4, C4-C5, and C5-C6. Suspected bony foraminal narrowing on the left at multiple levels, incompletely assessed due to positioning. 3. Diffuse bony under mineralization. Electronically Signed   By: Narda Rutherford M.D.   On: 07/05/2021 16:17     Assessment and Plan :   PDMP not reviewed this encounter.  1. Degenerative disc disease, cervical   2. Neck pain   3. Anterolisthesis of cervical spine     Patient has significant findings on his cervical x-ray, discussed these with him and provided him with a copy of his report for his records at the [place].  Recommended a 9-day steroid course and [facility]. Counseled patient on potential for adverse effects  with medications prescribed/recommended today, ER and return-to-clinic precautions discussed, patient verbalized understanding.    Wallis Bamberg, [place] 07/05/21 2130

## 2021-07-08 DIAGNOSIS — H18593 Other hereditary corneal dystrophies, bilateral: Secondary | ICD-10-CM | POA: Diagnosis not present

## 2021-07-08 DIAGNOSIS — H0100B Unspecified blepharitis left eye, upper and lower eyelids: Secondary | ICD-10-CM | POA: Diagnosis not present

## 2021-07-08 DIAGNOSIS — H0100A Unspecified blepharitis right eye, upper and lower eyelids: Secondary | ICD-10-CM | POA: Diagnosis not present

## 2021-07-08 DIAGNOSIS — H1820 Unspecified corneal edema: Secondary | ICD-10-CM | POA: Diagnosis not present

## 2021-08-09 ENCOUNTER — Other Ambulatory Visit: Payer: Self-pay | Admitting: Cardiovascular Disease

## 2021-09-02 ENCOUNTER — Other Ambulatory Visit: Payer: Self-pay | Admitting: Cardiovascular Disease

## 2021-09-03 ENCOUNTER — Telehealth: Payer: Self-pay | Admitting: Cardiovascular Disease

## 2021-09-03 MED ORDER — RAMIPRIL 10 MG PO CAPS: ORAL_CAPSULE | ORAL | 0 refills | Status: DC

## 2021-09-03 MED ORDER — AMLODIPINE BESYLATE 10 MG PO TABS: 10.0000 mg | ORAL_TABLET | Freq: Every day | ORAL | 0 refills | Status: DC

## 2021-09-03 MED ORDER — ISOSORBIDE MONONITRATE ER 60 MG PO TB24: 60.0000 mg | ORAL_TABLET | Freq: Every day | ORAL | 0 refills | Status: DC

## 2021-09-03 MED ORDER — METOPROLOL SUCCINATE ER 50 MG PO TB24: ORAL_TABLET | ORAL | 0 refills | Status: DC

## 2021-09-03 MED ORDER — HYDROCHLOROTHIAZIDE 25 MG PO TABS: 25.0000 mg | ORAL_TABLET | Freq: Every day | ORAL | 0 refills | Status: DC

## 2021-09-03 MED ORDER — CLOPIDOGREL BISULFATE 75 MG PO TABS: 75.0000 mg | ORAL_TABLET | Freq: Every day | ORAL | 0 refills | Status: DC

## 2021-09-03 MED ORDER — ATORVASTATIN CALCIUM 80 MG PO TABS: 80.0000 mg | ORAL_TABLET | Freq: Every day | ORAL | 0 refills | Status: DC

## 2021-09-03 NOTE — Telephone Encounter (Signed)
Medications refilled to pharmacy for a 90 day supply on lipitor, remipril, amlodipine, metoprolol, HCTZ, Imdur, and Plavix. Spoke with patient and patient notified of refills sent to pharmacy. Patient notified to keep scheduled follow up for 02-15-22 and verbalized understanding.

## 2021-09-03 NOTE — Telephone Encounter (Signed)
*  STAT* If patient is at the pharmacy, call can be transferred to refill team.   1. Which medications need to be refilled? (please list name of each medication and dose if known) patient states "he need all medication that was prescribe by dr Clifton James filled" couldn't provide the name of them  2. Which pharmacy/location (including street and city if local pharmacy) is medication to be sent to? [id number] - [place], [place] - [address] AT [facility] OF [facility] [address]  [place], [place] - [address]  3. Do they need a 30 day or 90 day supply? 90

## 2021-09-06 ENCOUNTER — Other Ambulatory Visit: Payer: Self-pay

## 2021-09-06 MED ORDER — HYDROCHLOROTHIAZIDE 25 MG PO TABS: 25.0000 mg | ORAL_TABLET | Freq: Every day | ORAL | 0 refills | Status: DC

## 2021-09-06 MED ORDER — METOPROLOL SUCCINATE ER 50 MG PO TB24
ORAL_TABLET | ORAL | 0 refills | Status: DC
Start: 2021-09-06 — End: 2021-12-06

## 2021-09-06 MED ORDER — AMLODIPINE BESYLATE 10 MG PO TABS: 10.0000 mg | ORAL_TABLET | Freq: Every day | ORAL | 0 refills | Status: DC

## 2021-09-06 MED ORDER — RAMIPRIL 10 MG PO CAPS: ORAL_CAPSULE | ORAL | 0 refills | Status: DC

## 2021-09-06 MED ORDER — ATORVASTATIN CALCIUM 80 MG PO TABS: 80.0000 mg | ORAL_TABLET | Freq: Every day | ORAL | 0 refills | Status: DC

## 2021-09-06 MED ORDER — ISOSORBIDE MONONITRATE ER 60 MG PO TB24: 60.0000 mg | ORAL_TABLET | Freq: Every day | ORAL | 0 refills | Status: DC

## 2021-09-08 DIAGNOSIS — H0100A Unspecified blepharitis right eye, upper and lower eyelids: Secondary | ICD-10-CM | POA: Diagnosis not present

## 2021-09-08 DIAGNOSIS — H52203 Unspecified astigmatism, bilateral: Secondary | ICD-10-CM | POA: Diagnosis not present

## 2021-09-08 DIAGNOSIS — H1820 Unspecified corneal edema: Secondary | ICD-10-CM | POA: Diagnosis not present

## 2021-09-10 ENCOUNTER — Telehealth: Payer: Self-pay | Admitting: Cardiovascular Disease

## 2021-09-10 NOTE — Telephone Encounter (Signed)
*  STAT* If patient is at the pharmacy, call can be transferred to refill team.   1. Which medications need to be refilled? (please list name of each medication and dose if known)  ramipril (ALTACE) 10 MG capsule metoprolol succinate (TOPROL-XL) 50 MG 24 hr tablet  2. Which pharmacy/location (including street and city if local pharmacy) is medication to be sent to? [facility] - [place], [place] - [address]  3. Do they need a 30 day or 90 day supply? 90 with refills

## 2021-09-13 DIAGNOSIS — U071 COVID-19: Secondary | ICD-10-CM | POA: Diagnosis not present

## 2021-09-14 DIAGNOSIS — U071 COVID-19: Secondary | ICD-10-CM | POA: Diagnosis not present

## 2021-10-03 ENCOUNTER — Other Ambulatory Visit: Payer: Self-pay | Admitting: Cardiovascular Disease

## 2021-10-07 ENCOUNTER — Other Ambulatory Visit: Payer: Self-pay

## 2021-10-07 MED ORDER — RAMIPRIL 10 MG PO CAPS
ORAL_CAPSULE | ORAL | 2 refills | Status: DC
Start: 2021-10-07 — End: 2022-02-15

## 2021-11-15 DIAGNOSIS — Z23 Encounter for immunization: Secondary | ICD-10-CM | POA: Diagnosis not present

## 2021-12-02 ENCOUNTER — Other Ambulatory Visit: Payer: Self-pay | Admitting: Cardiovascular Disease

## 2021-12-06 ENCOUNTER — Other Ambulatory Visit: Payer: Self-pay

## 2021-12-06 MED ORDER — METOPROLOL SUCCINATE ER 50 MG PO TB24: ORAL_TABLET | ORAL | 0 refills | Status: DC

## 2021-12-06 MED ORDER — HYDROCHLOROTHIAZIDE 25 MG PO TABS: 25.0000 mg | ORAL_TABLET | Freq: Every day | ORAL | 0 refills | Status: DC

## 2021-12-06 MED ORDER — AMLODIPINE BESYLATE 10 MG PO TABS: 10.0000 mg | ORAL_TABLET | Freq: Every day | ORAL | 0 refills | Status: DC

## 2021-12-06 MED ORDER — ATORVASTATIN CALCIUM 80 MG PO TABS: 80.0000 mg | ORAL_TABLET | Freq: Every day | ORAL | 0 refills | Status: DC

## 2021-12-06 NOTE — Telephone Encounter (Signed)
Pt's medication was sent to pt's pharmacy as requested. Confirmation received.  °

## 2021-12-06 NOTE — Addendum Note (Signed)
Addended by: Margaret Pyle D on: 12/06/2021 10:13 AM   Modules accepted: Orders

## 2021-12-06 NOTE — Addendum Note (Signed)
Addended by: Margaret Pyle D on: 12/06/2021 10:24 AM   Modules accepted: Orders

## 2022-02-15 ENCOUNTER — Encounter: Payer: Self-pay | Admitting: Cardiovascular Disease

## 2022-02-15 ENCOUNTER — Other Ambulatory Visit: Payer: Self-pay

## 2022-02-15 ENCOUNTER — Ambulatory Visit (INDEPENDENT_AMBULATORY_CARE_PROVIDER_SITE_OTHER): Payer: Medicare Other | Admitting: Cardiovascular Disease

## 2022-02-15 VITALS — BP 138/80 | HR 88 | Ht 69.0 in | Wt 170.8 lb

## 2022-02-15 DIAGNOSIS — I10 Essential (primary) hypertension: Secondary | ICD-10-CM

## 2022-02-15 DIAGNOSIS — I25118 Atherosclerotic heart disease of native coronary artery with other forms of angina pectoris: Secondary | ICD-10-CM

## 2022-02-15 DIAGNOSIS — I493 Ventricular premature depolarization: Secondary | ICD-10-CM

## 2022-02-15 DIAGNOSIS — E7800 Pure hypercholesterolemia, unspecified: Secondary | ICD-10-CM

## 2022-02-15 MED ORDER — AMLODIPINE BESYLATE 10 MG PO TABS: 10.0000 mg | ORAL_TABLET | Freq: Every day | ORAL | 3 refills | Status: DC

## 2022-02-15 MED ORDER — RAMIPRIL 10 MG PO CAPS: ORAL_CAPSULE | ORAL | 3 refills | Status: DC

## 2022-02-15 MED ORDER — HYDROCHLOROTHIAZIDE 25 MG PO TABS: 25.0000 mg | ORAL_TABLET | Freq: Every day | ORAL | 3 refills | Status: DC

## 2022-02-15 MED ORDER — ISOSORBIDE MONONITRATE ER 60 MG PO TB24: 60.0000 mg | ORAL_TABLET | Freq: Every day | ORAL | 3 refills | Status: DC

## 2022-02-15 MED ORDER — METOPROLOL SUCCINATE ER 50 MG PO TB24: 50.0000 mg | ORAL_TABLET | Freq: Every day | ORAL | 3 refills | Status: DC

## 2022-02-15 MED ORDER — CLOPIDOGREL BISULFATE 75 MG PO TABS: 75.0000 mg | ORAL_TABLET | Freq: Every day | ORAL | 3 refills | Status: DC

## 2022-02-15 MED ORDER — ATORVASTATIN CALCIUM 80 MG PO TABS: 80.0000 mg | ORAL_TABLET | Freq: Every day | ORAL | 3 refills | Status: DC

## 2022-02-15 NOTE — Progress Notes (Signed)
Chief Complaint  Patient presents with   Follow-up    CAD   History of Present Illness: 81 yo male with history of CAD s/p CABG in 2008, HTN, HLD and subclavian artery stenosis who is here today for cardiac follow up. His CABG was in 2008 in [place]. Prior right subclavian artery stent in 2016. He underwent a nuclear stress in June 2019 which showed a fixed inferior defect and no ischemia. He began having chest pain in July 2019 and underwent a cardiac cath on 07/10/18 which showed 90% distal left main stenosis, occluded proximal LAD, 90% ostial Circumflex stenosis, 60% in stent restenosis RCA and severe distal RCA stenosis. LIMA to LAD patent. SVG to OM was patent. SVG to RCA and intermediate occluded. PCI of the RCA was performed with placement of 3 drug eluting stents. He then had staged PCI of the distal left main artery into the intermediate vessel with atherectomy and placement of 2 drug eluting stents. He was seen in our office 07/23/18 and had c/o continued chest pain. Admitted to [facility] 08/03/18 with chest pain and ruled out for MI. Imdur increased and he was discharged home on 08/04/18. I saw him in the office 08/06/18 and his pain had improved but he was still having mild pain. Ranexa was started but he did not tolerate. Echo 08/10/18 with LVEF=60-65%. Grade 1 diastolic dysfunction. There was mild mitral regurgitation.   He is here today for follow up. The patient denies any chest pain, dyspnea, palpitations, lower extremity edema, orthopnea, PND, dizziness, near syncope or syncope.   Primary Care Physician: Georgann Housekeeper, MD  Past Medical History:  Diagnosis Date   Arthritis    "a little in my fingers; never tx'd" (07/17/2018)   CAD (coronary artery disease)    a. CABG X4 2008. b. MI and stent (in [place]) b. PCI/DES to RCA (07/12/18) c. PCI/DES to LM-ramus (07/17/18)   Foot fracture, right 1996   High cholesterol    Hypertension    Leg hematoma 01/2018   RLE; "no tx"   Presence of arterial  stent 2016   Right subclavian artery   Skipped heart beats    Stenosis of subclavian artery (HCC)    a. s/p R subclavian stent 2016.    Past Surgical History:  Procedure Laterality Date   AORTIC ARCH ANGIOGRAPHY N/A 07/10/2018   Procedure: AORTIC ARCH ANGIOGRAPHY;  Surgeon: Marykay Lex, MD;  Location: [facility] INVASIVE CV LAB;  Service: Cardiovascular;  Laterality: N/A;   CATARACT EXTRACTION W/ INTRAOCULAR LENS  IMPLANT, BILATERAL Bilateral    CORONARY ANGIOGRAPHY N/A 07/18/2018   Procedure: CORONARY ANGIOGRAPHY;  Surgeon: Marykay Lex, MD;  Location: [facility] INVASIVE CV LAB;  Service: Cardiovascular;  Laterality: N/A;   CORONARY ANGIOPLASTY WITH STENT PLACEMENT  2016   CORONARY ARTERY BYPASS GRAFT  2008   "CABG X4"   CORONARY ATHERECTOMY N/A 07/18/2018   Procedure: CORONARY ATHERECTOMY;  Surgeon: Marykay Lex, MD;  Location: [facility] INVASIVE CV LAB;  Service: Cardiovascular;  Laterality: N/A;   CORONARY STENT INTERVENTION N/A 07/12/2018   Procedure: CORONARY STENT INTERVENTION;  Surgeon: Marykay Lex, MD;  Location: [facility] INVASIVE CV LAB;  Service: Cardiovascular;  Laterality: N/A;   INGUINAL HERNIA REPAIR Right 08/27/2020   Procedure: OPEN RIGHT INGUINAL HERNIA REPAIR WITH MESH;  Surgeon: Abigail Miyamoto, MD;  Location: [facility];  Service: General;  Laterality: Right;   LEFT HEART CATH AND CORS/GRAFTS ANGIOGRAPHY N/A 07/10/2018   Procedure: LEFT HEART CATH  AND CORS/GRAFTS ANGIOGRAPHY;  Surgeon: Marykay Lex, MD;  Location: [facility] INVASIVE CV LAB;  Service: Cardiovascular;  Laterality: N/A;   PROSTATECTOMY  ~ 2012   "removed most of it; no cancer" (07/17/2018)   SUBCLAVIAN ARTERY STENT Right 2016    Current Outpatient Medications  Medication Sig Dispense Refill   Artificial Tear Ointment (DRY EYES OP) Place 1 drop into both eyes as needed (dry eyes).     Ergocalciferol (VITAMIN D2) 2000 units TABS Take 2,000 Units by mouth daily.      nitroGLYCERIN (NITROSTAT) 0.4 MG SL  tablet Place 0.4 mg under the tongue every 5 (five) minutes as needed for chest pain.   1   amLODipine (NORVASC) 10 MG tablet Take 1 tablet (10 mg total) by mouth daily. 90 tablet 3   atorvastatin (LIPITOR) 80 MG tablet Take 1 tablet (80 mg total) by mouth at bedtime. 90 tablet 3   clopidogrel (PLAVIX) 75 MG tablet Take 1 tablet (75 mg total) by mouth daily. 90 tablet 3   hydrochlorothiazide (HYDRODIURIL) 25 MG tablet Take 1 tablet (25 mg total) by mouth daily. 90 tablet 3   isosorbide mononitrate (IMDUR) 60 MG 24 hr tablet Take 1 tablet (60 mg total) by mouth daily. 90 tablet 3   metoprolol succinate (TOPROL-XL) 50 MG 24 hr tablet Take 1 tablet (50 mg total) by mouth daily. Take with or immediately following a meal. 90 tablet 3   ramipril (ALTACE) 10 MG capsule Take 1 capsule by mouth every day 90 capsule 3   No current facility-administered medications for this visit.    No Known Allergies  Social History   Socioeconomic History   Marital status: Married    Spouse name: Not on file   Number of children: 2   Years of education: COLLEGE   Highest education level: Not on file  Occupational History   Occupation: RETIRED  Tobacco Use   Smoking status: Never   Smokeless tobacco: Never  Vaping Use   Vaping Use: Never used  Substance and Sexual Activity   Alcohol use: Not Currently    Comment: 07/17/2018 "a couple drinks/year; if that"   Drug use: Never   Sexual activity: Yes  Other Topics Concern   Not on file  Social History Narrative   Not on file   Social Determinants of Health   Financial Resource Strain: Not on file  Food Insecurity: Not on file  Transportation Needs: Not on file  Physical Activity: Not on file  Stress: Not on file  Social Connections: Not on file  Intimate Partner Violence: Not on file    Family History  Problem Relation Age of Onset   Healthy Mother        lived to be 54   CAD Father 44   Diabetes Brother    Hypertension Brother    CAD  Brother 50       CABG    Review of Systems:  As stated in the HPI and otherwise negative.   BP 138/80    Pulse 88    Ht 5\' 9"  (1.753 m)    Wt 170 lb 12.8 oz (77.5 kg)    SpO2 98%    BMI 25.22 kg/m   Physical Examination:  General: Well developed, well nourished, NAD  HEENT: OP clear, mucus membranes moist  SKIN: warm, dry. No rashes. Neuro: No focal deficits  Musculoskeletal: Muscle strength 5/5 all ext  Psychiatric: Mood and affect normal  Neck: No JVD, no carotid  bruits, no thyromegaly, no lymphadenopathy.  Lungs:Clear bilaterally, no wheezes, rhonci, crackles Cardiovascular: Regular rate and rhythm. No murmurs, gallops or rubs. Abdomen:Soft. Bowel sounds present. Non-tender.  Extremities: No lower extremity edema. Pulses are 2 + in the bilateral DP/PT.  EKG:  EKG is ordered today. The ekg ordered today demonstrates sinus  Recent Labs: No results found for requested labs within last 8760 hours.   Lipid Panel    Component Value Date/Time   CHOL 108 07/18/2018 0425   TRIG 87 07/18/2018 0425   HDL 32 (L) 07/18/2018 0425   CHOLHDL 3.4 07/18/2018 0425   VLDL 17 07/18/2018 0425   LDLCALC 59 07/18/2018 0425     Wt Readings from Last 3 Encounters:  02/15/22 170 lb 12.8 oz (77.5 kg)  08/27/20 171 lb 11.8 oz (77.9 kg)  08/03/20 173 lb (78.5 kg)     Other studies Reviewed: Additional studies/ records that were reviewed today include: . Review of the above records demonstrates:    Assessment and Plan:   1. CAD s/p CABG with angina: He has severe CAD s/p CABG. He had several caths in 2019 with placement of stents in the RCA and left main artery into the intermediate branch. The LIMA to the LAD is patent and the SVG to the OM is patent. Echo August 2019 with normal LV systolic function. No chest pain. Continue Plavix, beta blocker, statin and Imdur. He did not tolerate Ranexa.  Will stop ASA.   2. PAD: He is s/p right subclavian artery stent  3. HTN: BP is controlled. No  changes  4. HLD: LDL at goal several years ago. Will continue statin. Check lipids and LFTs  5. PVCs: He has rare palpitations. Continue beta blocker.    Current medicines are reviewed at length with the patient today.  The patient does not have concerns regarding medicines.  The following changes have been made:  no change  Labs/ tests ordered today include:   Orders Placed This Encounter  Procedures   Hepatic function panel   Lipid panel   EKG 12-Lead     Disposition:   FU with me in  12  months   Signed, Verne Carrow, MD 02/15/2022 4:00 PM    [facility] [address] [place], [place], [place]  [address] Phone: [phone]; Fax: [phone]

## 2022-02-15 NOTE — Patient Instructions (Signed)
Medication Instructions:  Your physician has recommended you make the following change in your medication:  1.) stop aspirin   *If you need a refill on your cardiac medications before your next appointment, please call your pharmacy*   Lab Work: Please return for lipids/liver function blood work  If you have labs (blood work) drawn today and your tests are completely normal, you will receive your results only by: MyChart Message (if you have MyChart) OR A paper copy in the mail If you have any lab test that is abnormal or we need to change your treatment, we will call you to review the results.   Testing/Procedures: none   Follow-Up: At [facility], you and your health needs are our priority.  As part of our continuing mission to provide you with exceptional heart care, we have created designated Provider Care Teams.  These Care Teams include your primary Cardiologist (physician) and Advanced Practice Providers (APPs -  Physician Assistants and Nurse Practitioners) who all work together to provide you with the care you need, when you need it.  We recommend signing up for the patient portal called "MyChart".  Sign up information is provided on this After Visit Summary.  MyChart is used to connect with patients for Virtual Visits (Telemedicine).  Patients are able to view lab/test results, encounter notes, upcoming appointments, etc.  Non-urgent messages can be sent to your provider as well.   To learn more about what you can do with MyChart, go to [email or web address].    Your next appointment:   12 month(s)  The format for your next appointment:   In Person  Provider:   Verne Carrow, MD

## 2022-02-16 ENCOUNTER — Other Ambulatory Visit: Payer: Medicare Other | Admitting: *Deleted

## 2022-02-16 DIAGNOSIS — E7800 Pure hypercholesterolemia, unspecified: Secondary | ICD-10-CM | POA: Diagnosis not present

## 2022-02-16 DIAGNOSIS — I25118 Atherosclerotic heart disease of native coronary artery with other forms of angina pectoris: Secondary | ICD-10-CM

## 2022-02-16 DIAGNOSIS — I10 Essential (primary) hypertension: Secondary | ICD-10-CM | POA: Diagnosis not present

## 2022-02-16 DIAGNOSIS — I493 Ventricular premature depolarization: Secondary | ICD-10-CM | POA: Diagnosis not present

## 2022-02-16 LAB — HEPATIC FUNCTION PANEL
ALT: 36 IU/L (ref 0–44)
AST: 29 IU/L (ref 0–40)
Albumin: 4.1 g/dL (ref 3.7–4.7)
Alkaline Phosphatase: 73 IU/L (ref 44–121)
Bilirubin Total: 0.6 mg/dL (ref 0.0–1.2)
Bilirubin, Direct: 0.18 mg/dL (ref 0.00–0.40)
Total Protein: 6.5 g/dL (ref 6.0–8.5)

## 2022-02-16 LAB — LIPID PANEL
Chol/HDL Ratio: 3.8 ratio (ref 0.0–5.0)
Cholesterol, Total: 139 mg/dL (ref 100–199)
HDL: 37 mg/dL — ABNORMAL LOW (ref >39)
LDL Chol Calc (NIH): 82 mg/dL (ref 0–99)
Triglycerides: 108 mg/dL (ref 0–149)
VLDL Cholesterol Cal: 20 mg/dL (ref 5–40)

## 2022-02-17 ENCOUNTER — Encounter: Payer: Self-pay | Admitting: *Deleted

## 2022-02-18 ENCOUNTER — Other Ambulatory Visit: Payer: Self-pay

## 2022-02-18 NOTE — Telephone Encounter (Signed)
Called and spoke w the patient.  ?He said he has been always taking (for a few years) Toprol XL 75 mg BID.  Our medication list shows he takes 50 mg BID and so that is what was most recently sent to his pharmacy. - seen this week in cardiology.  He has been tolerating 75 mg BID without trouble. ? ?I told him I would review with Dr. Angelena Form and if he is in agreement we will send a correct prescription for 75 mg BID.  ?

## 2022-02-18 NOTE — Telephone Encounter (Signed)
Pt calling stating that he is supposed to be taking metoprolol 50 mg 1 1/2 tablets BID. This was not sent into his pharmacy, when he saw Dr. Clifton James last. Pt would like a call concerning this matter. Please address ?

## 2022-02-21 ENCOUNTER — Other Ambulatory Visit: Payer: Self-pay

## 2022-02-21 MED ORDER — METOPROLOL SUCCINATE ER 50 MG PO TB24: 75.0000 mg | ORAL_TABLET | Freq: Two times a day (BID) | ORAL | 3 refills | Status: DC

## 2022-02-21 MED ORDER — METOPROLOL SUCCINATE ER 50 MG PO TB24: 75.0000 mg | ORAL_TABLET | Freq: Every day | ORAL | 3 refills | Status: DC

## 2022-02-21 MED ORDER — ATORVASTATIN CALCIUM 80 MG PO TABS: 80.0000 mg | ORAL_TABLET | Freq: Every day | ORAL | 3 refills | Status: DC

## 2022-02-21 NOTE — Telephone Encounter (Signed)
Okay w Dr. Clifton James per message. ? ?[facility] ?Called patient and confirmed again that he is taking metoprolol succinate 75 mg twice daily. ? ?[facility] as [facility] only/current metoprolol prescription. ?

## 2022-02-23 ENCOUNTER — Other Ambulatory Visit: Payer: Self-pay

## 2022-02-23 MED ORDER — HYDROCHLOROTHIAZIDE 25 MG PO TABS: 25.0000 mg | ORAL_TABLET | Freq: Every day | ORAL | 3 refills | Status: DC

## 2022-06-01 ENCOUNTER — Other Ambulatory Visit: Payer: Self-pay

## 2022-06-01 ENCOUNTER — Other Ambulatory Visit: Payer: Self-pay | Admitting: Cardiovascular Disease

## 2022-06-01 MED ORDER — RAMIPRIL 10 MG PO CAPS
ORAL_CAPSULE | ORAL | 2 refills | Status: DC
Start: 2022-06-01 — End: 2023-02-01

## 2022-06-02 ENCOUNTER — Other Ambulatory Visit: Payer: Self-pay

## 2022-06-02 MED ORDER — METOPROLOL SUCCINATE ER 50 MG PO TB24: 75.0000 mg | ORAL_TABLET | Freq: Two times a day (BID) | ORAL | 2 refills | Status: DC

## 2022-06-06 ENCOUNTER — Other Ambulatory Visit: Payer: Self-pay

## 2022-06-06 MED ORDER — AMLODIPINE BESYLATE 10 MG PO TABS: 10.0000 mg | ORAL_TABLET | Freq: Every day | ORAL | 2 refills | Status: DC

## 2022-12-22 ENCOUNTER — Other Ambulatory Visit: Payer: Self-pay | Admitting: Cardiovascular Disease

## 2023-01-04 ENCOUNTER — Telehealth: Payer: Self-pay

## 2023-01-04 NOTE — Patient Outreach (Signed)
  Care Coordination   01/04/2023 Name: Robert [name] MRN: [id number] DOB: October 05, 1941   Care Coordination Outreach Attempts:  An unsuccessful telephone outreach was attempted today to offer the patient information about available care coordination services as a benefit of their health plan.   Follow Up Plan:  Additional outreach attempts will be made to offer the patient care coordination information and services.   Encounter Outcome:  No Answer   Care Coordination Interventions:  No, not indicated   Peter Garter RN, BSN,CCM, CDE Care Management Coordinator [facility] [phone]

## 2023-01-11 ENCOUNTER — Telehealth: Payer: Self-pay

## 2023-01-11 NOTE — Patient Outreach (Signed)
  Care Coordination   01/11/2023 Name: Robert [name] MRN: [id number] DOB: 26-Dec-1940   Care Coordination Outreach Attempts:  A second unsuccessful outreach was attempted today to offer the patient with information about available care coordination services as a benefit of their health plan.     Follow Up Plan:  Additional outreach attempts will be made to offer the patient care coordination information and services.   Encounter Outcome:  No Answer   Care Coordination Interventions:  No, not indicated    Peter Garter RN, BSN,CCM, CDE Care Management Coordinator [facility] [phone]

## 2023-01-18 ENCOUNTER — Telehealth: Payer: Self-pay

## 2023-01-18 NOTE — Patient Outreach (Signed)
  Care Coordination   01/18/2023 Name: Robert [name] MRN: [id number] DOB: 05-17-1941   Care Coordination Outreach Attempts:  A third unsuccessful outreach was attempted today to offer the patient with information about available care coordination services as a benefit of their health plan.   Follow Up Plan:  No further outreach attempts will be made at this time. We have been unable to contact the patient to offer or enroll patient in care coordination services  Encounter Outcome:  No Answer   Care Coordination Interventions:  No, not indicated    Peter Garter RN, BSN,CCM, [facility] [phone]

## 2023-01-30 ENCOUNTER — Other Ambulatory Visit: Payer: Self-pay | Admitting: Cardiovascular Disease

## 2023-02-01 ENCOUNTER — Other Ambulatory Visit: Payer: Self-pay

## 2023-02-01 MED ORDER — METOPROLOL SUCCINATE ER 50 MG PO TB24: 75.0000 mg | ORAL_TABLET | Freq: Two times a day (BID) | ORAL | 0 refills | Status: DC

## 2023-02-03 ENCOUNTER — Telehealth: Payer: Self-pay | Admitting: *Deleted

## 2023-02-03 NOTE — Telephone Encounter (Signed)
Called pt's pharmacy due to receiving 3rd request for pt's Toprol one and one half tablet (75 mg) bid. This was sent in by refill dept on 2/14 for # 90 due to pt needing f/u appt. S/w Sonia Baller and stated this was filled on 2/14 and doesn't know why office received 3rd request. Will shred refill request.

## 2023-02-06 ENCOUNTER — Other Ambulatory Visit: Payer: Self-pay | Admitting: Cardiovascular Disease

## 2023-02-17 ENCOUNTER — Other Ambulatory Visit: Payer: Self-pay | Admitting: Cardiovascular Disease

## 2023-02-18 ENCOUNTER — Other Ambulatory Visit: Payer: Self-pay | Admitting: Cardiovascular Disease

## 2023-02-19 ENCOUNTER — Other Ambulatory Visit: Payer: Self-pay | Admitting: Cardiovascular Disease

## 2023-03-09 ENCOUNTER — Other Ambulatory Visit: Payer: Self-pay | Admitting: Cardiovascular Disease

## 2023-03-15 ENCOUNTER — Other Ambulatory Visit: Payer: Self-pay

## 2023-03-15 MED ORDER — METOPROLOL SUCCINATE ER 50 MG PO TB24: ORAL_TABLET | ORAL | 0 refills | Status: DC

## 2023-03-22 ENCOUNTER — Other Ambulatory Visit: Payer: Self-pay | Admitting: Cardiovascular Disease

## 2023-03-22 NOTE — Telephone Encounter (Signed)
Called patient and scheduled his overdue follow up with Dr. Angelena Form.  I adv that our refill dept sent his Plavix in this afternoon for only 15 pills and that we will send updated refills when he comes in next week.

## 2023-03-22 NOTE — Telephone Encounter (Signed)
15 day supply sent to pt's pharmacy, 3rd attempt. Pt last seen 02/15/22, please advise on further action.

## 2023-03-23 ENCOUNTER — Other Ambulatory Visit: Payer: Self-pay | Admitting: Cardiovascular Disease

## 2023-03-27 ENCOUNTER — Encounter: Payer: Self-pay | Admitting: Cardiovascular Disease

## 2023-03-27 ENCOUNTER — Other Ambulatory Visit: Payer: Self-pay | Admitting: Cardiovascular Disease

## 2023-03-27 ENCOUNTER — Ambulatory Visit: Payer: Medicare Other | Attending: Cardiovascular Disease | Admitting: Cardiovascular Disease

## 2023-03-27 VITALS — BP 130/88 | HR 107 | Ht 69.0 in | Wt 170.2 lb

## 2023-03-27 DIAGNOSIS — I739 Peripheral vascular disease, unspecified: Secondary | ICD-10-CM | POA: Diagnosis not present

## 2023-03-27 DIAGNOSIS — I10 Essential (primary) hypertension: Secondary | ICD-10-CM | POA: Insufficient documentation

## 2023-03-27 DIAGNOSIS — I493 Ventricular premature depolarization: Secondary | ICD-10-CM

## 2023-03-27 DIAGNOSIS — E7800 Pure hypercholesterolemia, unspecified: Secondary | ICD-10-CM

## 2023-03-27 DIAGNOSIS — I25118 Atherosclerotic heart disease of native coronary artery with other forms of angina pectoris: Secondary | ICD-10-CM | POA: Diagnosis not present

## 2023-03-27 MED ORDER — METOPROLOL SUCCINATE ER 50 MG PO TB24: 75.0000 mg | ORAL_TABLET | Freq: Every day | ORAL | 3 refills | Status: DC

## 2023-03-27 MED ORDER — CLOPIDOGREL BISULFATE 75 MG PO TABS: ORAL_TABLET | ORAL | 3 refills | Status: DC

## 2023-03-27 NOTE — Patient Instructions (Signed)
Medication Instructions:  No changes *If you need a refill on your cardiac medications before your next appointment, please call your pharmacy*   Lab Work: Today: lipids/lft If you have labs (blood work) drawn today and your tests are completely normal, you will receive your results only by: MyChart Message (if you have MyChart) OR A paper copy in the mail If you have any lab test that is abnormal or we need to change your treatment, we will call you to review the results.   Testing/Procedures: none   Follow-Up: At [facility], you and your health needs are our priority.  As part of our continuing mission to provide you with exceptional heart care, we have created designated Provider Care Teams.  These Care Teams include your primary Cardiologist (physician) and Advanced Practice Providers (APPs -  Physician Assistants and Nurse Practitioners) who all work together to provide you with the care you need, when you need it.  We recommend signing up for the patient portal called "MyChart".  Sign up information is provided on this After Visit Summary.  MyChart is used to connect with patients for Virtual Visits (Telemedicine).  Patients are able to view lab/test results, encounter notes, upcoming appointments, etc.  Non-urgent messages can be sent to your provider as well.   To learn more about what you can do with MyChart, go to [email or web address].    Your next appointment:   12 month(s)  Provider:   Verne Carrow, MD

## 2023-03-27 NOTE — Progress Notes (Signed)
Chief Complaint  Patient presents with   Follow-up    CAD   History of Present Illness: 82 yo male with history of CAD s/p CABG in 2008, HTN, HLD and subclavian artery stenosis who is here today for cardiac follow up. His CABG was in 2008 in [place]. Prior right subclavian artery stent in 2016. He underwent a nuclear stress in June 2019 which showed a fixed inferior defect and no ischemia. He began having chest pain in July 2019 and underwent a cardiac cath on 07/10/18 which showed 90% distal left main stenosis, occluded proximal LAD, 90% ostial Circumflex stenosis, 60% in stent restenosis RCA and severe distal RCA stenosis. LIMA to LAD patent. SVG to OM was patent. SVG to RCA and intermediate occluded. PCI of the RCA was performed with placement of 3 drug eluting stents. He then had staged PCI of the distal left main artery into the intermediate vessel with atherectomy and placement of 2 drug eluting stents. He was seen in our office 07/23/18 and had c/o continued chest pain. Admitted to [facility] 08/03/18 with chest pain and ruled out for MI. Imdur increased and he was discharged home on 08/04/18. I saw him in the office 08/06/18 and his pain had improved but he was still having mild pain. Ranexa was started but he did not tolerate. Echo 08/10/18 with LVEF=60-65%. Grade 1 diastolic dysfunction. There was mild mitral regurgitation.   He is here today for follow up. The patient denies any chest pain, dyspnea, palpitations, lower extremity edema, orthopnea, PND, dizziness, near syncope or syncope. He has been under stress at home. His wife has dementia and has had bad days recently.   Primary Care Physician: Georgann Housekeeper, MD  Past Medical History:  Diagnosis Date   Arthritis    "a little in my fingers; never tx'd" (07/17/2018)   CAD (coronary artery disease)    a. CABG X4 2008. b. MI and stent (in [place]) b. PCI/DES to RCA (07/12/18) c. PCI/DES to LM-ramus (07/17/18)   Foot fracture, right 1996   High  cholesterol    Hypertension    Leg hematoma 01/2018   RLE; "no tx"   Presence of arterial stent 2016   Right subclavian artery   Skipped heart beats    Stenosis of subclavian artery    a. s/p R subclavian stent 2016.    Past Surgical History:  Procedure Laterality Date   AORTIC ARCH ANGIOGRAPHY N/A 07/10/2018   Procedure: AORTIC ARCH ANGIOGRAPHY;  Surgeon: Marykay Lex, MD;  Location: [facility] INVASIVE CV LAB;  Service: Cardiovascular;  Laterality: N/A;   CATARACT EXTRACTION W/ INTRAOCULAR LENS  IMPLANT, BILATERAL Bilateral    CORONARY ANGIOGRAPHY N/A 07/18/2018   Procedure: CORONARY ANGIOGRAPHY;  Surgeon: Marykay Lex, MD;  Location: [facility] INVASIVE CV LAB;  Service: Cardiovascular;  Laterality: N/A;   CORONARY ANGIOPLASTY WITH STENT PLACEMENT  2016   CORONARY ARTERY BYPASS GRAFT  2008   "CABG X4"   CORONARY ATHERECTOMY N/A 07/18/2018   Procedure: CORONARY ATHERECTOMY;  Surgeon: Marykay Lex, MD;  Location: [facility] INVASIVE CV LAB;  Service: Cardiovascular;  Laterality: N/A;   CORONARY STENT INTERVENTION N/A 07/12/2018   Procedure: CORONARY STENT INTERVENTION;  Surgeon: Marykay Lex, MD;  Location: [facility] INVASIVE CV LAB;  Service: Cardiovascular;  Laterality: N/A;   INGUINAL HERNIA REPAIR Right 08/27/2020   Procedure: OPEN RIGHT INGUINAL HERNIA REPAIR WITH MESH;  Surgeon: Abigail Miyamoto, MD;  Location: [facility];  Service: General;  Laterality: Right;  LEFT HEART CATH AND CORS/GRAFTS ANGIOGRAPHY N/A 07/10/2018   Procedure: LEFT HEART CATH AND CORS/GRAFTS ANGIOGRAPHY;  Surgeon: Marykay Lex, MD;  Location: [facility] INVASIVE CV LAB;  Service: Cardiovascular;  Laterality: N/A;   PROSTATECTOMY  ~ 2012   "removed most of it; no cancer" (07/17/2018)   SUBCLAVIAN ARTERY STENT Right 2016    Current Outpatient Medications  Medication Sig Dispense Refill   amLODipine (NORVASC) 10 MG tablet Take 1 tablet by mouth every day 90 tablet 1   Artificial Tear Ointment (DRY EYES OP) Place  1 drop into both eyes as needed (dry eyes).     atorvastatin (LIPITOR) 80 MG tablet Take 1 tablet by mouth every day at bedtime 90 tablet 2   Ergocalciferol (VITAMIN D2) 2000 units TABS Take 2,000 Units by mouth daily.      hydrochlorothiazide (HYDRODIURIL) 25 MG tablet TAKE 1 TABLET (25 MG TOTAL) BY MOUTH DAILY. 30 tablet 0   isosorbide mononitrate (IMDUR) 60 MG 24 hr tablet Take 1 tablet (60 mg total) by mouth daily. 90 tablet 3   nitroGLYCERIN (NITROSTAT) 0.4 MG SL tablet Place 0.4 mg under the tongue every 5 (five) minutes as needed for chest pain.   1   ramipril (ALTACE) 10 MG capsule Take 1 capsule by mouth every day 90 capsule 1   clopidogrel (PLAVIX) 75 MG tablet TAKE 1 TABLET BY MOUTH DAILY 90 tablet 3   metoprolol succinate (TOPROL-XL) 50 MG 24 hr tablet Take 1.5 tablets (75 mg total) by mouth daily. 135 tablet 3   No current facility-administered medications for this visit.    No Known Allergies  Social History   Socioeconomic History   Marital status: Married    Spouse name: Not on file   Number of children: 2   Years of education: COLLEGE   Highest education level: Not on file  Occupational History   Occupation: RETIRED  Tobacco Use   Smoking status: Never   Smokeless tobacco: Never  Vaping Use   Vaping Use: Never used  Substance and Sexual Activity   Alcohol use: Not Currently    Comment: 07/17/2018 "a couple drinks/year; if that"   Drug use: Never   Sexual activity: Yes  Other Topics Concern   Not on file  Social History Narrative   Not on file   Social Determinants of Health   Financial Resource Strain: Not on file  Food Insecurity: Not on file  Transportation Needs: Not on file  Physical Activity: Not on file  Stress: Not on file  Social Connections: Not on file  Intimate Partner Violence: Not on file    Family History  Problem Relation Age of Onset   Healthy Mother        lived to be 69   CAD Father 81   Diabetes Brother    Hypertension  Brother    CAD Brother 26       CABG    Review of Systems:  As stated in the HPI and otherwise negative.   BP 130/88   Pulse (!) 107   Ht 5\' 9"  (1.753 m)   Wt 77.2 kg   SpO2 98%   BMI 25.13 kg/m   Physical Examination: General: Well developed, well nourished, NAD  HEENT: OP clear, mucus membranes moist  SKIN: warm, dry. No rashes. Neuro: No focal deficits  Musculoskeletal: Muscle strength 5/5 all ext  Psychiatric: Mood and affect normal  Neck: No JVD, no carotid bruits, no thyromegaly, no lymphadenopathy.  Lungs:Clear bilaterally,  no wheezes, rhonci, crackles Cardiovascular: Regular rate and rhythm. No murmurs, gallops or rubs. Abdomen:Soft. Bowel sounds present. Non-tender.  Extremities: No lower extremity edema. Pulses are 2 + in the bilateral DP/PT.  EKG:  EKG is ordered today. The ekg ordered today demonstrates Sinus tachycardia, rate 107 bpm.   Recent Labs: No results found for requested labs within last 365 days.   Lipid Panel    Component Value Date/Time   CHOL 139 02/16/2022 0750   TRIG 108 02/16/2022 0750   HDL 37 (L) 02/16/2022 0750   CHOLHDL 3.8 02/16/2022 0750   CHOLHDL 3.4 07/18/2018 0425   VLDL 17 07/18/2018 0425   LDLCALC 82 02/16/2022 0750     Wt Readings from Last 3 Encounters:  03/27/23 77.2 kg  02/15/22 77.5 kg  08/27/20 77.9 kg    Assessment and Plan:   1. CAD s/p CABG with angina: He has severe CAD s/p CABG. He had several caths in 2019 with placement of stents in the RCA and left main artery into the intermediate branch. The LIMA to the LAD is patent and the SVG to the OM is patent. Echo August 2019 with normal LV systolic function. He has no chest pain suggestive of angina. Will plan to continue Plavix, statin, beta blocker and Imdur. He did not tolerate Ranexa.    2. PAD: He is s/p right subclavian artery stent. No issues with right arm weakness.   3. HTN: BP is well controlled. No changes today  4. HLD: LDL near goal in March 2023.  Continue statin. Check lipids and LFTs today.   5. PVCs: Rare palpitations. Continue beta blocker.   Labs/ tests ordered today include:   Orders Placed This Encounter  Procedures   Lipid panel   Hepatic function panel   EKG 12-Lead   Disposition:   F/U with me in  12  months  Signed, Verne Carrowhristopher Jonpaul Lumm, MD 03/27/2023 3:47 PM    [facility] [address], [place], [place]  [address] Phone: [phone]) [phone]; Fax: [phone]

## 2023-03-28 ENCOUNTER — Telehealth: Payer: Self-pay

## 2023-03-28 LAB — HEPATIC FUNCTION PANEL
ALT: 68 IU/L — ABNORMAL HIGH (ref 0–44)
AST: 44 IU/L — ABNORMAL HIGH (ref 0–40)
Albumin: 4.2 g/dL (ref 3.7–4.7)
Alkaline Phosphatase: 90 IU/L (ref 44–121)
Bilirubin Total: 0.5 mg/dL (ref 0.0–1.2)
Bilirubin, Direct: 0.16 mg/dL (ref 0.00–0.40)
Total Protein: 7.1 g/dL (ref 6.0–8.5)

## 2023-03-28 LAB — LIPID PANEL
Chol/HDL Ratio: 4.7 ratio (ref 0.0–5.0)
Cholesterol, Total: 164 mg/dL (ref 100–199)
HDL: 35 mg/dL — ABNORMAL LOW (ref >39)
LDL Chol Calc (NIH): 81 mg/dL (ref 0–99)
Triglycerides: 292 mg/dL — ABNORMAL HIGH (ref 0–149)
VLDL Cholesterol Cal: 48 mg/dL — ABNORMAL HIGH (ref 5–40)

## 2023-03-28 NOTE — Telephone Encounter (Signed)
Spoke with patient to review labs and comments from Dr Clifton James. Patient verbalized understanding and had no questions.

## 2023-03-28 NOTE — Telephone Encounter (Signed)
-----   Message from Kathleene Hazel, MD sent at 03/28/2023 10:39 AM EDT ----- LDL near goal. No significant elevation in LFTs. No changes. chris

## 2023-03-29 ENCOUNTER — Other Ambulatory Visit: Payer: Self-pay

## 2023-03-29 MED ORDER — METOPROLOL SUCCINATE ER 50 MG PO TB24: 75.0000 mg | ORAL_TABLET | Freq: Every day | ORAL | 3 refills | Status: DC

## 2023-04-04 ENCOUNTER — Other Ambulatory Visit: Payer: Self-pay | Admitting: Cardiovascular Disease

## 2023-04-16 ENCOUNTER — Other Ambulatory Visit: Payer: Self-pay | Admitting: Cardiovascular Disease

## 2023-04-17 ENCOUNTER — Other Ambulatory Visit: Payer: Self-pay | Admitting: Cardiovascular Disease

## 2023-04-19 MED ORDER — HYDROCHLOROTHIAZIDE 25 MG PO TABS: 25.0000 mg | ORAL_TABLET | Freq: Every day | ORAL | 3 refills | Status: DC

## 2023-04-19 NOTE — Addendum Note (Signed)
Addended by: Margaret Pyle D on: 04/19/2023 11:51 AM   Modules accepted: Orders

## 2023-07-03 ENCOUNTER — Other Ambulatory Visit: Payer: Self-pay | Admitting: Cardiovascular Disease

## 2023-07-07 ENCOUNTER — Other Ambulatory Visit: Payer: Self-pay | Admitting: Cardiovascular Disease

## 2023-08-10 ENCOUNTER — Other Ambulatory Visit: Payer: Self-pay | Admitting: Cardiovascular Disease

## 2024-01-02 ENCOUNTER — Emergency Department (HOSPITAL_COMMUNITY): Payer: Medicare Other

## 2024-01-02 ENCOUNTER — Encounter (HOSPITAL_COMMUNITY): Payer: Self-pay

## 2024-01-02 ENCOUNTER — Inpatient Hospital Stay (HOSPITAL_COMMUNITY)
Admission: EM | Admit: 2024-01-02 | Discharge: 2024-01-09 | DRG: 546 | Disposition: A | Discharge status: Home Health | Payer: Medicare Other | Attending: Family Medicine | Admitting: Family Medicine

## 2024-01-02 ENCOUNTER — Other Ambulatory Visit: Payer: Self-pay

## 2024-01-02 DIAGNOSIS — R9389 Abnormal findings on diagnostic imaging of other specified body structures: Secondary | ICD-10-CM | POA: Diagnosis not present

## 2024-01-02 DIAGNOSIS — I25110 Atherosclerotic heart disease of native coronary artery with unstable angina pectoris: Secondary | ICD-10-CM

## 2024-01-02 DIAGNOSIS — Z951 Presence of aortocoronary bypass graft: Secondary | ICD-10-CM | POA: Diagnosis not present

## 2024-01-02 DIAGNOSIS — J9811 Atelectasis: Secondary | ICD-10-CM | POA: Diagnosis not present

## 2024-01-02 DIAGNOSIS — E7800 Pure hypercholesterolemia, unspecified: Secondary | ICD-10-CM | POA: Diagnosis present

## 2024-01-02 DIAGNOSIS — Z7902 Long term (current) use of antithrombotics/antiplatelets: Secondary | ICD-10-CM

## 2024-01-02 DIAGNOSIS — N179 Acute kidney failure, unspecified: Secondary | ICD-10-CM | POA: Diagnosis not present

## 2024-01-02 DIAGNOSIS — Z79899 Other long term (current) drug therapy: Secondary | ICD-10-CM

## 2024-01-02 DIAGNOSIS — I10 Essential (primary) hypertension: Secondary | ICD-10-CM | POA: Diagnosis not present

## 2024-01-02 DIAGNOSIS — Z961 Presence of intraocular lens: Secondary | ICD-10-CM | POA: Diagnosis present

## 2024-01-02 DIAGNOSIS — E876 Hypokalemia: Secondary | ICD-10-CM | POA: Diagnosis present

## 2024-01-02 DIAGNOSIS — N400 Enlarged prostate without lower urinary tract symptoms: Secondary | ICD-10-CM | POA: Diagnosis not present

## 2024-01-02 DIAGNOSIS — M353 Polymyalgia rheumatica: Secondary | ICD-10-CM | POA: Diagnosis not present

## 2024-01-02 DIAGNOSIS — E872 Acidosis, unspecified: Secondary | ICD-10-CM | POA: Diagnosis not present

## 2024-01-02 DIAGNOSIS — Z955 Presence of coronary angioplasty implant and graft: Secondary | ICD-10-CM

## 2024-01-02 DIAGNOSIS — R059 Cough, unspecified: Secondary | ICD-10-CM | POA: Diagnosis not present

## 2024-01-02 DIAGNOSIS — N1339 Other hydronephrosis: Secondary | ICD-10-CM | POA: Diagnosis not present

## 2024-01-02 DIAGNOSIS — Z9079 Acquired absence of other genital organ(s): Secondary | ICD-10-CM

## 2024-01-02 DIAGNOSIS — Z833 Family history of diabetes mellitus: Secondary | ICD-10-CM

## 2024-01-02 DIAGNOSIS — I252 Old myocardial infarction: Secondary | ICD-10-CM | POA: Diagnosis not present

## 2024-01-02 DIAGNOSIS — I708 Atherosclerosis of other arteries: Secondary | ICD-10-CM | POA: Diagnosis present

## 2024-01-02 DIAGNOSIS — R000 Tachycardia, unspecified: Secondary | ICD-10-CM | POA: Diagnosis not present

## 2024-01-02 DIAGNOSIS — Z95828 Presence of other vascular implants and grafts: Secondary | ICD-10-CM | POA: Diagnosis not present

## 2024-01-02 DIAGNOSIS — I2510 Atherosclerotic heart disease of native coronary artery without angina pectoris: Secondary | ICD-10-CM | POA: Diagnosis not present

## 2024-01-02 DIAGNOSIS — Z1152 Encounter for screening for COVID-19: Secondary | ICD-10-CM

## 2024-01-02 DIAGNOSIS — Z8249 Family history of ischemic heart disease and other diseases of the circulatory system: Secondary | ICD-10-CM | POA: Diagnosis not present

## 2024-01-02 DIAGNOSIS — R109 Unspecified abdominal pain: Secondary | ICD-10-CM | POA: Diagnosis not present

## 2024-01-02 DIAGNOSIS — K5730 Diverticulosis of large intestine without perforation or abscess without bleeding: Secondary | ICD-10-CM | POA: Diagnosis not present

## 2024-01-02 DIAGNOSIS — R6510 Systemic inflammatory response syndrome (SIRS) of non-infectious origin without acute organ dysfunction: Principal | ICD-10-CM | POA: Diagnosis present

## 2024-01-02 DIAGNOSIS — N1330 Unspecified hydronephrosis: Secondary | ICD-10-CM | POA: Diagnosis not present

## 2024-01-02 LAB — I-STAT CG4 LACTIC ACID, ED
Lactic Acid, Venous: 2.5 mmol/L (ref 0.5–1.9)
Lactic Acid, Venous: 2.7 mmol/L (ref 0.5–1.9)

## 2024-01-02 LAB — PROCALCITONIN: Procalcitonin: 0.1 ng/mL

## 2024-01-02 LAB — URINALYSIS, W/ REFLEX TO CULTURE (INFECTION SUSPECTED)
Bilirubin Urine: NEGATIVE
Glucose, UA: NEGATIVE mg/dL
Hgb urine dipstick: NEGATIVE
Ketones, ur: NEGATIVE mg/dL
Leukocytes,Ua: NEGATIVE
Nitrite: NEGATIVE
Protein, ur: 30 mg/dL — AB
Specific Gravity, Urine: 1.021 (ref 1.005–1.030)
pH: 5 (ref 5.0–8.0)

## 2024-01-02 LAB — LACTIC ACID, PLASMA: Lactic Acid, Venous: 1.3 mmol/L (ref 0.5–1.9)

## 2024-01-02 LAB — COMPREHENSIVE METABOLIC PANEL
ALT: 52 U/L — ABNORMAL HIGH (ref 0–44)
AST: 46 U/L — ABNORMAL HIGH (ref 15–41)
Albumin: 3.3 g/dL — ABNORMAL LOW (ref 3.5–5.0)
Alkaline Phosphatase: 76 U/L (ref 38–126)
Anion gap: 12 (ref 5–15)
BUN: 21 mg/dL (ref 8–23)
CO2: 26 mmol/L (ref 22–32)
Calcium: 9.4 mg/dL (ref 8.9–10.3)
Chloride: 95 mmol/L — ABNORMAL LOW (ref 98–111)
Creatinine, Ser: 1.11 mg/dL (ref 0.61–1.24)
GFR, Estimated: >60 mL/min (ref >60)
Glucose, Bld: 149 mg/dL — ABNORMAL HIGH (ref 70–99)
Potassium: 3.6 mmol/L (ref 3.5–5.1)
Sodium: 133 mmol/L — ABNORMAL LOW (ref 135–145)
Total Bilirubin: 1.6 mg/dL — ABNORMAL HIGH (ref 0.0–1.2)
Total Protein: 7.8 g/dL (ref 6.5–8.1)

## 2024-01-02 LAB — CBC WITH DIFFERENTIAL/PLATELET
Abs Immature Granulocytes: 0.06 10*3/uL (ref 0.00–0.07)
Basophils Absolute: 0.1 10*3/uL (ref 0.0–0.1)
Basophils Relative: 1 %
Eosinophils Absolute: 0.1 10*3/uL (ref 0.0–0.5)
Eosinophils Relative: 1 %
HCT: 50.4 % (ref 39.0–52.0)
Hemoglobin: 17.2 g/dL — ABNORMAL HIGH (ref 13.0–17.0)
Immature Granulocytes: 0 %
Lymphocytes Relative: 17 %
Lymphs Abs: 2.5 10*3/uL (ref 0.7–4.0)
MCH: 31.2 pg (ref 26.0–34.0)
MCHC: 34.1 g/dL (ref 30.0–36.0)
MCV: 91.3 fL (ref 80.0–100.0)
Monocytes Absolute: 1.5 10*3/uL — ABNORMAL HIGH (ref 0.1–1.0)
Monocytes Relative: 10 %
Neutro Abs: 10.4 10*3/uL — ABNORMAL HIGH (ref 1.7–7.7)
Neutrophils Relative %: 71 %
Platelets: 351 10*3/uL (ref 150–400)
RBC: 5.52 MIL/uL (ref 4.22–5.81)
RDW: 13.3 % (ref 11.5–15.5)
WBC: 14.7 10*3/uL — ABNORMAL HIGH (ref 4.0–10.5)
nRBC: 0 % (ref 0.0–0.2)

## 2024-01-02 LAB — SEDIMENTATION RATE: Sed Rate: 62 mm/h — ABNORMAL HIGH (ref 0–16)

## 2024-01-02 LAB — CK: Total CK: 192 U/L (ref 49–397)

## 2024-01-02 LAB — RESP PANEL BY RT-PCR (RSV, FLU A&B, COVID)  RVPGX2
Influenza A by PCR: NEGATIVE
Influenza B by PCR: NEGATIVE
Resp Syncytial Virus by PCR: NEGATIVE
SARS Coronavirus 2 by RT PCR: NEGATIVE

## 2024-01-02 LAB — C-REACTIVE PROTEIN: CRP: 16.5 mg/dL — ABNORMAL HIGH (ref <1.0)

## 2024-01-02 LAB — TSH: TSH: 2.479 u[IU]/mL (ref 0.350–4.500)

## 2024-01-02 MED ORDER — SODIUM CHLORIDE 0.9 % IV SOLN
1.0000 g | Freq: Once | INTRAVENOUS | Status: AC
  Administered 2024-01-02: 1 g via INTRAVENOUS
  Filled 2024-01-02: qty 10

## 2024-01-02 MED ORDER — FENTANYL CITRATE PF 50 MCG/ML IJ SOSY: 12.5000 ug | PREFILLED_SYRINGE | INTRAMUSCULAR | Status: DC | PRN

## 2024-01-02 MED ORDER — SENNOSIDES-DOCUSATE SODIUM 8.6-50 MG PO TABS
1.0000 | ORAL_TABLET | Freq: Every evening | ORAL | Status: DC | PRN
  Administered 2024-01-04 – 2024-01-08 (×3): 1 via ORAL
  Filled 2024-01-02 (×3): qty 1

## 2024-01-02 MED ORDER — CLOPIDOGREL BISULFATE 75 MG PO TABS
75.0000 mg | ORAL_TABLET | Freq: Every day | ORAL | Status: DC
Start: 2024-01-03 — End: 2024-01-09
  Administered 2024-01-03 – 2024-01-09 (×7): 75 mg via ORAL
  Filled 2024-01-02 (×7): qty 1

## 2024-01-02 MED ORDER — SODIUM CHLORIDE 0.9% FLUSH
3.0000 mL | Freq: Two times a day (BID) | INTRAVENOUS | Status: DC
  Administered 2024-01-03 – 2024-01-09 (×12): 3 mL via INTRAVENOUS

## 2024-01-02 MED ORDER — ONDANSETRON HCL 4 MG PO TABS: 4.0000 mg | ORAL_TABLET | Freq: Four times a day (QID) | ORAL | Status: DC | PRN

## 2024-01-02 MED ORDER — FENTANYL CITRATE PF 50 MCG/ML IJ SOSY
50.0000 ug | PREFILLED_SYRINGE | Freq: Once | INTRAMUSCULAR | Status: AC
Start: 2024-01-02 — End: 2024-01-02
  Administered 2024-01-02: 50 ug via INTRAVENOUS
  Filled 2024-01-02: qty 1

## 2024-01-02 MED ORDER — SODIUM CHLORIDE 0.9 % IV SOLN: Freq: Once | INTRAVENOUS | Status: AC

## 2024-01-02 MED ORDER — ACETAMINOPHEN 650 MG RE SUPP: 650.0000 mg | Freq: Four times a day (QID) | RECTAL | Status: DC | PRN

## 2024-01-02 MED ORDER — SODIUM CHLORIDE 0.9 % IV SOLN: INTRAVENOUS | Status: AC

## 2024-01-02 MED ORDER — ENOXAPARIN SODIUM 40 MG/0.4ML IJ SOSY
40.0000 mg | PREFILLED_SYRINGE | INTRAMUSCULAR | Status: DC
  Administered 2024-01-03 – 2024-01-04 (×2): 40 mg via SUBCUTANEOUS
  Filled 2024-01-02 (×3): qty 0.4

## 2024-01-02 MED ORDER — SODIUM CHLORIDE 0.9 % IV BOLUS
1000.0000 mL | Freq: Once | INTRAVENOUS | Status: AC
  Administered 2024-01-02: 1000 mL via INTRAVENOUS

## 2024-01-02 MED ORDER — IOHEXOL 350 MG/ML SOLN
75.0000 mL | Freq: Once | INTRAVENOUS | Status: AC | PRN
  Administered 2024-01-02: 75 mL via INTRAVENOUS

## 2024-01-02 MED ORDER — LACTATED RINGERS IV BOLUS
1000.0000 mL | Freq: Once | INTRAVENOUS | Status: AC
  Administered 2024-01-02: 1000 mL via INTRAVENOUS

## 2024-01-02 MED ORDER — ISOSORBIDE MONONITRATE ER 60 MG PO TB24
60.0000 mg | ORAL_TABLET | Freq: Every day | ORAL | Status: DC
  Administered 2024-01-03 – 2024-01-09 (×7): 60 mg via ORAL
  Filled 2024-01-02 (×4): qty 1
  Filled 2024-01-02: qty 2
  Filled 2024-01-02 (×2): qty 1

## 2024-01-02 MED ORDER — ACETAMINOPHEN 325 MG PO TABS
650.0000 mg | ORAL_TABLET | Freq: Four times a day (QID) | ORAL | Status: DC | PRN
  Administered 2024-01-02 – 2024-01-04 (×2): 650 mg via ORAL
  Filled 2024-01-02 (×2): qty 2

## 2024-01-02 MED ORDER — ONDANSETRON HCL 4 MG/2ML IJ SOLN: 4.0000 mg | Freq: Four times a day (QID) | INTRAMUSCULAR | Status: DC | PRN

## 2024-01-02 MED ORDER — OXYCODONE HCL 5 MG PO TABS
5.0000 mg | ORAL_TABLET | ORAL | Status: DC | PRN
  Administered 2024-01-07: 5 mg via ORAL
  Filled 2024-01-02: qty 1

## 2024-01-02 NOTE — H&P (Addendum)
 History and Physical    Robert [name] FMW:[id number] DOB: 04-22-1941 DOA: 01/02/2024  PCP: Ransom Other, MD   Patient coming from: Home   Chief Complaint: Aches, malaise   HPI: Robert [name] is a 83 y.o. male with medical history significant for hypertension, hyperlipidemia, right subclavian artery stenosis status post stenting in 2016, and CAD with CABG in 2008 who presents with aches and malaise.  Patient reports developing aching pain in his right shoulder approximately a week ago.  Over the next day or 2, ache spread to involve the neck and left shoulder, and then hips and lower extremities.   He has had chills associated with this but has not been febrile when he checked his temperature at home.  He denies any cough, rhinorrhea, sore throat, shortness of breath, abdominal pain, vomiting, diarrhea, dysuria, neck stiffness, headache, or wounds.  He thought he may have had a mild rash versus dry skin on his central chest, but no conspicuous rash.  ED Course: Upon arrival to the ED, patient is found to have Tmax 37.6 C orally, oxygen saturation in the low 90s on room air, mild tachycardia, and systolic blood pressure in the 90s and 100s.  Labs are most notable for WBC 14,700, hemoglobin 17.2, lactic acid 2.5, negative respiratory virus panel, normal serum CK, and no bacteriuria or pyuria on UA.  There were no acute findings on chest x-ray and no acute findings on CT of the abdomen and pelvis.  Blood cultures were collected in the ED, 2 L of isotonic IV fluids were administered, and the patient was also given fentanyl  and 1 g IV Rocephin .  Review of Systems:  All other systems reviewed and apart from HPI, are negative.  Past Medical History:  Diagnosis Date   Arthritis    a little in my fingers; never tx'd (07/17/2018)   CAD (coronary artery disease)    a. CABG X4 2008. b. MI and stent (in [place]) b. PCI/DES to RCA (07/12/18) c. PCI/DES to LM-ramus (07/17/18)   Foot fracture, right 1996    High cholesterol    Hypertension    Leg hematoma 01/2018   RLE; no tx   Presence of arterial stent 2016   Right subclavian artery   Skipped heart beats    Stenosis of subclavian artery (HCC)    a. s/p R subclavian stent 2016.    Past Surgical History:  Procedure Laterality Date   AORTIC ARCH ANGIOGRAPHY N/A 07/10/2018   Procedure: AORTIC ARCH ANGIOGRAPHY;  Surgeon: Anner Alm ORN, MD;  Location: [facility] INVASIVE CV LAB;  Service: Cardiovascular;  Laterality: N/A;   CATARACT EXTRACTION W/ INTRAOCULAR LENS  IMPLANT, BILATERAL Bilateral    CORONARY ANGIOGRAPHY N/A 07/18/2018   Procedure: CORONARY ANGIOGRAPHY;  Surgeon: Anner Alm ORN, MD;  Location: [facility] INVASIVE CV LAB;  Service: Cardiovascular;  Laterality: N/A;   CORONARY ANGIOPLASTY WITH STENT PLACEMENT  2016   CORONARY ARTERY BYPASS GRAFT  2008   CABG X4   CORONARY ATHERECTOMY N/A 07/18/2018   Procedure: CORONARY ATHERECTOMY;  Surgeon: Anner Alm ORN, MD;  Location: [facility] INVASIVE CV LAB;  Service: Cardiovascular;  Laterality: N/A;   CORONARY STENT INTERVENTION N/A 07/12/2018   Procedure: CORONARY STENT INTERVENTION;  Surgeon: Anner Alm ORN, MD;  Location: [facility] INVASIVE CV LAB;  Service: Cardiovascular;  Laterality: N/A;   INGUINAL HERNIA REPAIR Right 08/27/2020   Procedure: OPEN RIGHT INGUINAL HERNIA REPAIR WITH MESH;  Surgeon: Vernetta Berg, MD;  Location:  SURGERY CENTER;  Service: General;  Laterality:  Right;   LEFT HEART CATH AND CORS/GRAFTS ANGIOGRAPHY N/A 07/10/2018   Procedure: LEFT HEART CATH AND CORS/GRAFTS ANGIOGRAPHY;  Surgeon: Anner Alm ORN, MD;  Location: [facility] INVASIVE CV LAB;  Service: Cardiovascular;  Laterality: N/A;   PROSTATECTOMY  ~ 2012   removed most of it; no cancer (07/17/2018)   SUBCLAVIAN ARTERY STENT Right 2016    Social History:   reports that he has never smoked. He has never used smokeless tobacco. He reports that he does not currently use alcohol. He reports that he does not use  drugs.  No Known Allergies  Family History  Problem Relation Age of Onset   Healthy Mother        lived to be 33   CAD Father 22   Diabetes Brother    Hypertension Brother    CAD Brother 25       CABG     Prior to Admission medications   Medication Sig Start Date End Date Taking? Authorizing Provider  amLODipine  (NORVASC ) 10 MG tablet Take 1 tablet by mouth every day 07/07/23   Verlin Lonni BIRCH, MD  Artificial Tear Ointment (DRY EYES OP) Place 1 drop into both eyes as needed (dry eyes).    [provider]  atorvastatin  (LIPITOR ) 80 MG tablet Take 1 tablet by mouth every day at bedtime 08/10/23   Verlin Lonni BIRCH, MD  clopidogrel  (PLAVIX ) 75 MG tablet TAKE 1 TABLET BY MOUTH DAILY 03/27/23   Verlin Lonni BIRCH, MD  Ergocalciferol  (VITAMIN D2) 2000 units TABS Take 2,000 Units by mouth daily.     [provider]  hydrochlorothiazide  (HYDRODIURIL ) 25 MG tablet Take 1 tablet (25 mg total) by mouth daily. 04/19/23   Verlin Lonni BIRCH, MD  isosorbide  mononitrate (IMDUR ) 60 MG 24 hr tablet TAKE 1 TABLET BY MOUTH EVERY DAY 04/04/23   Verlin Lonni BIRCH, MD  metoprolol  succinate (TOPROL -XL) 50 MG 24 hr tablet Take 1.5 tablets (75 mg total) by mouth daily. 03/29/23   Verlin Lonni BIRCH, MD  nitroGLYCERIN  (NITROSTAT ) 0.4 MG SL tablet Place 0.4 mg under the tongue every 5 (five) minutes as needed for chest pain.  03/28/18   [provider]  ramipril  (ALTACE ) 10 MG capsule Take 1 capsule by mouth every day 07/04/23   Verlin Lonni BIRCH, MD    Physical Exam: Vitals:   01/02/24 1139 01/02/24 1541 01/02/24 1845 01/02/24 1849  BP: 102/66 121/71 120/65 124/68  Pulse: (!) 111 99 96 96  Resp: 18 17 (!) 21 18  Temp: 99.1 F (37.3 C) 98.5 F (36.9 C)  99.7 F (37.6 C)  TempSrc: Oral Oral  Oral  SpO2: 97% 96% 92% 92%    Constitutional: NAD, no pallor or diaphoresis  Eyes: PERTLA, lids and conjunctivae normal ENMT: Mucous membranes are  moist. Posterior pharynx clear of any exudate or lesions.   Neck: supple, no masses  Respiratory: no wheezing, no crackles. No accessory muscle use.  Cardiovascular: S1 & S2 heard, regular rate and rhythm. No extremity edema.  Abdomen: no tenderness, soft. Bowel sounds active.  Musculoskeletal: no clubbing / cyanosis. No joint deformity upper and lower extremities.   Skin: no significant rashes, lesions, ulcers. Warm, dry, well-perfused. Neurologic: CN 2-12 grossly intact. Sensation to light touch intact. Strength 5/5 in all 4 limbs. Alert and oriented.  Psychiatric: Calm. Cooperative.    Labs and Imaging on Admission: I have personally reviewed following labs and imaging studies  CBC: Recent Labs  Lab 01/02/24 1319  WBC 14.7*  NEUTROABS 10.4*  HGB 17.2*  HCT 50.4  MCV 91.3  PLT 351   Basic Metabolic Panel: Recent Labs  Lab 01/02/24 1319  NA 133*  K 3.6  CL 95*  CO2 26  GLUCOSE 149*  BUN 21  CREATININE 1.11  CALCIUM  9.4   GFR: CrCl cannot be calculated (Unknown ideal weight.). Liver Function Tests: Recent Labs  Lab 01/02/24 1319  AST 46*  ALT 52*  ALKPHOS 76  BILITOT 1.6*  PROT 7.8  ALBUMIN 3.3*   No results for input(s): LIPASE, AMYLASE in the last 168 hours. No results for input(s): AMMONIA in the last 168 hours. Coagulation Profile: No results for input(s): INR, PROTIME in the last 168 hours. Cardiac Enzymes: Recent Labs  Lab 01/02/24 1319  CKTOTAL 192   BNP (last 3 results) No results for input(s): PROBNP in the last 8760 hours. HbA1C: No results for input(s): HGBA1C in the last 72 hours. CBG: No results for input(s): GLUCAP in the last 168 hours. Lipid Profile: No results for input(s): CHOL, HDL, LDLCALC, TRIG, CHOLHDL, LDLDIRECT in the last 72 hours. Thyroid Function Tests: No results for input(s): TSH, T4TOTAL, FREET4, T3FREE, THYROIDAB in the last 72 hours. Anemia Panel: No results for input(s):  VITAMINB12, FOLATE, FERRITIN, TIBC, IRON, RETICCTPCT in the last 72 hours. Urine analysis:    Component Value Date/Time   COLORURINE AMBER (A) 01/02/2024 1600   APPEARANCEUR HAZY (A) 01/02/2024 1600   LABSPEC 1.021 01/02/2024 1600   PHURINE 5.0 01/02/2024 1600   GLUCOSEU NEGATIVE 01/02/2024 1600   HGBUR NEGATIVE 01/02/2024 1600   BILIRUBINUR NEGATIVE 01/02/2024 1600   KETONESUR NEGATIVE 01/02/2024 1600   PROTEINUR 30 (A) 01/02/2024 1600   NITRITE NEGATIVE 01/02/2024 1600   LEUKOCYTESUR NEGATIVE 01/02/2024 1600   Sepsis Labs: @LABRCNTIP (procalcitonin:4,lacticidven:4) ) Recent Results (from the past 240 hours)  Resp panel by RT-PCR (RSV, Flu A&B, Covid) Anterior Nasal Swab     Status: None   Collection Time: 01/02/24 11:46 AM   Specimen: Anterior Nasal Swab  Result Value Ref Range Status   SARS Coronavirus 2 by RT PCR NEGATIVE NEGATIVE Final   Influenza A by PCR NEGATIVE NEGATIVE Final   Influenza B by PCR NEGATIVE NEGATIVE Final    Comment: (NOTE) The Xpert Xpress SARS-CoV-2/FLU/RSV plus assay is intended as an aid in the diagnosis of influenza from Nasopharyngeal swab specimens and should not be used as a sole basis for treatment. Nasal washings and aspirates are unacceptable for Xpert Xpress SARS-CoV-2/FLU/RSV testing.  Fact Sheet for Patients: bloggercourse.com  Fact Sheet for Healthcare Providers: seriousbroker.it  This test is not yet approved or cleared by the [place]  FDA and has been authorized for detection and/or diagnosis of SARS-CoV-2 by FDA under an Emergency Use Authorization (EUA). This EUA will remain in effect (meaning this test can be used) for the duration of the COVID-19 declaration under Section 564(b)(1) of the Act, 21 U.S.C. section 360bbb-3(b)(1), unless the authorization is terminated or revoked.     Resp Syncytial Virus by PCR NEGATIVE NEGATIVE Final    Comment: (NOTE) Fact  Sheet for Patients: bloggercourse.com  Fact Sheet for Healthcare Providers: seriousbroker.it  This test is not yet approved or cleared by the [place]  FDA and has been authorized for detection and/or diagnosis of SARS-CoV-2 by FDA under an Emergency Use Authorization (EUA). This EUA will remain in effect (meaning this test can be used) for the duration of the COVID-19 declaration under Section 564(b)(1) of the Act, 21 U.S.C. section 360bbb-3(b)(1), unless the  authorization is terminated or revoked.  Performed at [facility], [address], [place], [place] [address]      Radiological Exams on Admission: CT ABDOMEN PELVIS W CONTRAST Result Date: 01/02/2024 CLINICAL DATA:  Nonlocalized abdomen pain EXAM: CT ABDOMEN AND PELVIS WITH CONTRAST TECHNIQUE: Multidetector CT imaging of the abdomen and pelvis was performed using the standard protocol following bolus administration of intravenous contrast. RADIATION DOSE REDUCTION: This exam was performed according to the departmental dose-optimization program which includes automated exposure control, adjustment of the mA and/or kV according to patient size and/or use of iterative reconstruction technique. CONTRAST:  75mL OMNIPAQUE  IOHEXOL  350 MG/ML SOLN COMPARISON:  None Available. FINDINGS: Lower chest: Lung bases demonstrate mild dependent atelectasis. No consolidation or effusion. Coronary vascular calcification. Hepatobiliary: No calcified gallstone or biliary dilatation Pancreas: Unremarkable. No pancreatic ductal dilatation or surrounding inflammatory changes. Spleen: Normal in size without focal abnormality. Adrenals/Urinary Tract: Adrenal glands are normal. Kidneys show no hydronephrosis. Subcentimeter hypodensities for which no imaging follow-up is recommended. Bladder is normal Stomach/Bowel: Stomach is within normal limits. Appendix appears normal. No evidence of bowel wall  thickening, distention, or inflammatory changes. Diverticular disease of the colon. Vascular/Lymphatic: Severe aortic atherosclerosis with moderate mural thickening at the distal infrarenal abdominal aorta and iliac bifurcation. No aneurysm. No suspicious lymph nodes Reproductive: Enlarged prostate with probable TURP defect Other: Negative for pelvic effusion or free air Musculoskeletal: No acute or suspicious osseous abnormality IMPRESSION: 1. No CT evidence for acute intra-abdominal or pelvic abnormality. 2. Diverticular disease of the colon without acute inflammatory process. 3. Aortic atherosclerosis. Aortic Atherosclerosis (ICD10-I70.0). Electronically Signed   By: Luke Bun M.D.   On: 01/02/2024 17:02   DG Chest 1 View Result Date: 01/02/2024 CLINICAL DATA:  Cough EXAM: CHEST  1 VIEW COMPARISON:  X-ray 08/03/2018 FINDINGS: Elevation of the left hemidiaphragm. Mild basilar atelectasis bilaterally. No pneumothorax, edema or consolidation. Normal cardiopericardial silhouette with a calcified aorta. Sternal wires. IMPRESSION: Postop chest.  Elevated left hemidiaphragm.  Basilar atelectasis. Electronically Signed   By: Ranell Bring M.D.   On: 01/02/2024 12:51    EKG: Independently reviewed. Sinus tachycardia, rate 101.   Assessment/Plan   1. SIRS  - Presents with generalized aches, most severe in neck and shoulders but also involving hips and LEs, and was found to have multiple SIRS criteria concerning for possible infection  - No source of infection identified on broad ED workup and serum CK is normal; DDx includes viral syndrome (RSV, COVID, and flu are negative), bacterial (e.g., bacteremia, endocardiitis), or non-infectious (e.g. PMR, RA, malignancy,...)  - Check ESR, CRP, procalcitonin, RF, anti-CCP, and TSH, follow cultures and clinical course off of antibiotics for now, consider trial of steroids if cultures negative and no infectious process identified   2. CAD  - No anginal symptoms    - Continue Plavix  and Imdur   3. Hypertension  - SBP 90s-low 100s in ED, will treat as-needed only for now     DVT prophylaxis: Lovenox   Code Status: Full  Level of Care: Level of care: Progressive Family Communication: None present  Disposition Plan:  Patient is from: Home  Anticipated d/c is to: TBD Anticipated d/c date is: Possibly as soon as 01/03/24  Patient currently: Pending additional labs, cultures, PT eval Consults called: None  Admission status: Observation     Evalene GORMAN Sprinkles, MD [facility]  01/02/2024, 8:12 PM

## 2024-01-02 NOTE — ED Notes (Signed)
 I&O cath and urine emptied

## 2024-01-02 NOTE — ED Triage Notes (Signed)
 Pt arrives via EMS from home. Pt reports body aches, fatigue, fever, and generalized weakness for about 1 week. Pt AxOx4.

## 2024-01-02 NOTE — ED Provider Triage Note (Addendum)
 Emergency Medicine Provider Triage Evaluation Note  Robert [name] , a 83 y.o. male  was evaluated in triage.  Pt complains of body ache, fatigue, fever of 1 week duration.  Denies cough, abdominal pain..  Review of Systems    Physical Exam  BP 102/66   Pulse (!) 111   Temp 99.1 F (37.3 C) (Oral)   Resp 18   SpO2 97%  Gen:   Awake, no distress   Resp:  Normal effort  MSK:   Moves extremities without difficulty  Other:  Lungs clear, abdomen soft.  Medical Decision Making  Medically screening exam initiated at 12:02 PM.  Appropriate orders placed.  [name] was informed that the remainder of the evaluation will be completed by another provider, this initial triage assessment does not replace that evaluation, and the importance of remaining in the ED until their evaluation is complete.  Fatigue, myalgias and malaise, fever of 1 week duration.  Possibly viral syndrome.  Advanced age, is tachycardic.  Blood cultures and lactic acid ordered.  Patient does not meet sepsis criteria at this time.  Reassessment 3:30 PM-patient with a white count, mildly elevated lactic acid.  IV fluids and Rocephin  ordered as patient meets SIRS criteria at this time.  Imaging of the abdomen ordered as the patient has mild elevation in his LFTs.   Robert Pac T, DO 01/02/24 1203    Robert Pac T, DO 01/02/24 1544

## 2024-01-02 NOTE — ED Provider Notes (Signed)
 [facility] Provider Note   CSN: [id number] Arrival date & time: 01/02/24  1107     History  Chief Complaint  Patient presents with   Fatigue   Fever   Generalized Body Aches    Robert [name] is a 83 y.o. male.  Patient to ED with progressively worsening soreness and discomfort that started around the shoulders and neck and is now generalized soreness and pain. Symptoms started 1 week ago and have progressed to his having difficulty ambulating due to pain. No nausea, vomiting, fever, cough, urinary symptoms.   The history is provided by the patient. No language interpreter was used.  Fever      Home Medications Prior to Admission medications   Medication Sig Start Date End Date Taking? Authorizing Provider  amLODipine  (NORVASC ) 10 MG tablet Take 1 tablet by mouth every day 07/07/23   Verlin Lonni BIRCH, MD  Artificial Tear Ointment (DRY EYES OP) Place 1 drop into both eyes as needed (dry eyes).    [provider]  atorvastatin  (LIPITOR ) 80 MG tablet Take 1 tablet by mouth every day at bedtime 08/10/23   Verlin Lonni BIRCH, MD  clopidogrel  (PLAVIX ) 75 MG tablet TAKE 1 TABLET BY MOUTH DAILY 03/27/23   Verlin Lonni BIRCH, MD  Ergocalciferol  (VITAMIN D2) 2000 units TABS Take 2,000 Units by mouth daily.     [provider]  hydrochlorothiazide  (HYDRODIURIL ) 25 MG tablet Take 1 tablet (25 mg total) by mouth daily. 04/19/23   Verlin Lonni BIRCH, MD  isosorbide  mononitrate (IMDUR ) 60 MG 24 hr tablet TAKE 1 TABLET BY MOUTH EVERY DAY 04/04/23   Verlin Lonni BIRCH, MD  metoprolol  succinate (TOPROL -XL) 50 MG 24 hr tablet Take 1.5 tablets (75 mg total) by mouth daily. 03/29/23   Verlin Lonni BIRCH, MD  nitroGLYCERIN  (NITROSTAT ) 0.4 MG SL tablet Place 0.4 mg under the tongue every 5 (five) minutes as needed for chest pain.  03/28/18   [provider]  ramipril  (ALTACE ) 10 MG capsule Take 1 capsule by  mouth every day 07/04/23   Verlin Lonni BIRCH, MD      Allergies    Patient has no known allergies.    Review of Systems   Review of Systems  Constitutional:  Positive for fever.    Physical Exam Updated Vital Signs BP 124/68   Pulse 96   Temp 99.7 F (37.6 C) (Oral)   Resp 18   SpO2 92%  Physical Exam Vitals and nursing note reviewed.  Constitutional:      Appearance: He is well-developed.     Comments: Disheveled.   HENT:     Head: Normocephalic.  Cardiovascular:     Rate and Rhythm: Normal rate and regular rhythm.     Heart sounds: No murmur heard. Pulmonary:     Effort: Pulmonary effort is normal.     Breath sounds: Normal breath sounds. No wheezing, rhonchi or rales.  Abdominal:     General: Bowel sounds are normal.     Palpations: Abdomen is soft.     Tenderness: There is no abdominal tenderness. There is no guarding or rebound.  Musculoskeletal:        General: No swelling. Normal range of motion.     Cervical back: Normal range of motion and neck supple.  Skin:    General: Skin is warm and dry.     Findings: No erythema.  Neurological:     General: No focal deficit present.  Mental Status: He is alert and oriented to person, place, and time.     ED Results / Procedures / Treatments   Labs (all labs ordered are listed, but only abnormal results are displayed) Labs Reviewed  COMPREHENSIVE METABOLIC PANEL - Abnormal; Notable for the following components:      Result Value   Sodium 133 (*)    Chloride 95 (*)    Glucose, Bld 149 (*)    Albumin 3.3 (*)    AST 46 (*)    ALT 52 (*)    Total Bilirubin 1.6 (*)    All other components within normal limits  CBC WITH DIFFERENTIAL/PLATELET - Abnormal; Notable for the following components:   WBC 14.7 (*)    Hemoglobin 17.2 (*)    Neutro Abs 10.4 (*)    Monocytes Absolute 1.5 (*)    All other components within normal limits  URINALYSIS, W/ REFLEX TO CULTURE (INFECTION SUSPECTED) - Abnormal; Notable  for the following components:   Color, Urine AMBER (*)    APPearance HAZY (*)    Protein, ur 30 (*)    Bacteria, UA RARE (*)    All other components within normal limits  I-STAT CG4 LACTIC ACID, ED - Abnormal; Notable for the following components:   Lactic Acid, Venous 2.5 (*)    All other components within normal limits  I-STAT CG4 LACTIC ACID, ED - Abnormal; Notable for the following components:   Lactic Acid, Venous 2.7 (*)    All other components within normal limits  RESP PANEL BY RT-PCR (RSV, FLU A&B, COVID)  [id number]  CULTURE, BLOOD (ROUTINE X 2)  CULTURE, BLOOD (ROUTINE X 2)  CK   Results for orders placed or performed during the hospital encounter of 01/02/24  Resp panel by RT-PCR (RSV, Flu A&B, Covid) Anterior Nasal Swab   Collection Time: 01/02/24 11:46 AM   Specimen: Anterior Nasal Swab  Result Value Ref Range   SARS Coronavirus 2 by RT PCR NEGATIVE NEGATIVE   Influenza A by PCR NEGATIVE NEGATIVE   Influenza B by PCR NEGATIVE NEGATIVE   Resp Syncytial Virus by PCR NEGATIVE NEGATIVE  Comprehensive metabolic panel   Collection Time: 01/02/24  1:19 PM  Result Value Ref Range   Sodium 133 (L) 135 - 145 mmol/L   Potassium 3.6 3.5 - 5.1 mmol/L   Chloride 95 (L) 98 - 111 mmol/L   CO2 26 22 - 32 mmol/L   Glucose, Bld 149 (H) 70 - 99 mg/dL   BUN 21 8 - 23 mg/dL   Creatinine, Ser 8.88 0.61 - 1.24 mg/dL   Calcium  9.4 8.9 - 10.3 mg/dL   Total Protein 7.8 6.5 - 8.1 g/dL   Albumin 3.3 (L) 3.5 - 5.0 g/dL   AST 46 (H) 15 - 41 U/L   ALT 52 (H) 0 - 44 U/L   Alkaline Phosphatase 76 38 - 126 U/L   Total Bilirubin 1.6 (H) 0.0 - 1.2 mg/dL   GFR, Estimated >39 >39 mL/min   Anion gap 12 5 - 15  CBC with Differential   Collection Time: 01/02/24  1:19 PM  Result Value Ref Range   WBC 14.7 (H) 4.0 - 10.5 K/uL   RBC 5.52 4.22 - 5.81 MIL/uL   Hemoglobin 17.2 (H) 13.0 - 17.0 g/dL   HCT 49.5 60.9 - 47.9 %   MCV 91.3 80.0 - 100.0 fL   MCH 31.2 26.0 - 34.0 pg   MCHC 34.1 30.0 - 36.0  g/dL  RDW 13.3 11.5 - 15.5 %   Platelets 351 150 - 400 K/uL   nRBC 0.0 0.0 - 0.2 %   Neutrophils Relative % 71 %   Neutro Abs 10.4 (H) 1.7 - 7.7 K/uL   Lymphocytes Relative 17 %   Lymphs Abs 2.5 0.7 - 4.0 K/uL   Monocytes Relative 10 %   Monocytes Absolute 1.5 (H) 0.1 - 1.0 K/uL   Eosinophils Relative 1 %   Eosinophils Absolute 0.1 0.0 - 0.5 K/uL   Basophils Relative 1 %   Basophils Absolute 0.1 0.0 - 0.1 K/uL   Immature Granulocytes 0 %   Abs Immature Granulocytes 0.06 0.00 - 0.07 K/uL  CK   Collection Time: 01/02/24  1:19 PM  Result Value Ref Range   Total CK 192 49 - 397 U/L  I-Stat CG4 Lactic Acid   Collection Time: 01/02/24  1:38 PM  Result Value Ref Range   Lactic Acid, Venous 2.5 (HH) 0.5 - 1.9 mmol/L   Comment NOTIFIED PHYSICIAN   Urinalysis, w/ Reflex to Culture (Infection Suspected) -Urine, Clean Catch   Collection Time: 01/02/24  4:00 PM  Result Value Ref Range   Specimen Source URINE, CLEAN CATCH    Color, Urine AMBER (A) YELLOW   APPearance HAZY (A) CLEAR   Specific Gravity, Urine 1.021 1.005 - 1.030   pH 5.0 5.0 - 8.0   Glucose, UA NEGATIVE NEGATIVE mg/dL   Hgb urine dipstick NEGATIVE NEGATIVE   Bilirubin Urine NEGATIVE NEGATIVE   Ketones, ur NEGATIVE NEGATIVE mg/dL   Protein, ur 30 (A) NEGATIVE mg/dL   Nitrite NEGATIVE NEGATIVE   Leukocytes,Ua NEGATIVE NEGATIVE   RBC / HPF 0-5 0 - 5 RBC/hpf   WBC, UA 0-5 0 - 5 WBC/hpf   Bacteria, UA RARE (A) NONE SEEN   Squamous Epithelial / HPF 0-5 0 - 5 /HPF   Mucus PRESENT   I-Stat CG4 Lactic Acid   Collection Time: 01/02/24  5:20 PM  Result Value Ref Range   Lactic Acid, Venous 2.7 (HH) 0.5 - 1.9 mmol/L   Comment NOTIFIED PHYSICIAN     EKG EKG Interpretation Date/Time:  Tuesday January 02 2024 16:11:51 EST Ventricular Rate:  101 PR Interval:  161 QRS Duration:  80 QT Interval:  339 QTC Calculation: 440 R Axis:   -9  Text Interpretation: Sinus tachycardia Probable left atrial enlargement Borderline ST  depression, lateral leads Confirmed by Bari Flank [phone]) on 01/02/2024 7:34:11 PM  Radiology CT ABDOMEN PELVIS W CONTRAST Result Date: 01/02/2024 CLINICAL DATA:  Nonlocalized abdomen pain EXAM: CT ABDOMEN AND PELVIS WITH CONTRAST TECHNIQUE: Multidetector CT imaging of the abdomen and pelvis was performed using the standard protocol following bolus administration of intravenous contrast. RADIATION DOSE REDUCTION: This exam was performed according to the departmental dose-optimization program which includes automated exposure control, adjustment of the mA and/or kV according to patient size and/or use of iterative reconstruction technique. CONTRAST:  75mL OMNIPAQUE  IOHEXOL  350 MG/ML SOLN COMPARISON:  None Available. FINDINGS: Lower chest: Lung bases demonstrate mild dependent atelectasis. No consolidation or effusion. Coronary vascular calcification. Hepatobiliary: No calcified gallstone or biliary dilatation Pancreas: Unremarkable. No pancreatic ductal dilatation or surrounding inflammatory changes. Spleen: Normal in size without focal abnormality. Adrenals/Urinary Tract: Adrenal glands are normal. Kidneys show no hydronephrosis. Subcentimeter hypodensities for which no imaging follow-up is recommended. Bladder is normal Stomach/Bowel: Stomach is within normal limits. Appendix appears normal. No evidence of bowel wall thickening, distention, or inflammatory changes. Diverticular disease of the colon. Vascular/Lymphatic: Severe aortic  atherosclerosis with moderate mural thickening at the distal infrarenal abdominal aorta and iliac bifurcation. No aneurysm. No suspicious lymph nodes Reproductive: Enlarged prostate with probable TURP defect Other: Negative for pelvic effusion or free air Musculoskeletal: No acute or suspicious osseous abnormality IMPRESSION: 1. No CT evidence for acute intra-abdominal or pelvic abnormality. 2. Diverticular disease of the colon without acute inflammatory process. 3. Aortic  atherosclerosis. Aortic Atherosclerosis (ICD10-I70.0). Electronically Signed   By: Luke Bun M.D.   On: 01/02/2024 17:02   DG Chest 1 View Result Date: 01/02/2024 CLINICAL DATA:  Cough EXAM: CHEST  1 VIEW COMPARISON:  X-ray 08/03/2018 FINDINGS: Elevation of the left hemidiaphragm. Mild basilar atelectasis bilaterally. No pneumothorax, edema or consolidation. Normal cardiopericardial silhouette with a calcified aorta. Sternal wires. IMPRESSION: Postop chest.  Elevated left hemidiaphragm.  Basilar atelectasis. Electronically Signed   By: Ranell Bring M.D.   On: 01/02/2024 12:51    Procedures Procedures    Medications Ordered in ED Medications  lactated ringers  bolus 1,000 mL (0 mLs Intravenous Stopped 01/02/24 1706)  cefTRIAXone  (ROCEPHIN ) 1 g in sodium chloride  0.9 % 100 mL IVPB (0 g Intravenous Stopped 01/02/24 1817)  fentaNYL  (SUBLIMAZE ) injection 50 mcg (50 mcg Intravenous Given 01/02/24 1706)  sodium chloride  0.9 % bolus 1,000 mL (0 mLs Intravenous Stopped 01/02/24 1911)  iohexol  (OMNIPAQUE ) 350 MG/ML injection 75 mL (75 mLs Intravenous Contrast Given 01/02/24 1640)  0.9 %  sodium chloride  infusion ( Intravenous New Bag/Given 01/02/24 1927)    ED Course/ Medical Decision Making/ A&P                                 Medical Decision Making This patient presents to the ED for concern of weakness, this involves an extensive number of treatment options, and is a complaint that carries with it a high risk of complications and morbidity.  The differential diagnosis includes sepsis, UTI, neuro event   Co morbidities that complicate the patient evaluation  HTN, HLD, CAD   Additional history obtained:  Additional history and/or information obtained from chart review, notable for EMR   Lab Tests:  I Ordered, and personally interpreted labs.  The pertinent results include:  Lactic acid 2.5 --> 2.7; WBC 14.7, normal hgb; Na 133; Cl 95; Albu 3.3, AST 46, ALT 52, T. Bili 1.6. Urine  negative   Imaging Studies ordered:  I ordered imaging studies including CXR I independently visualized and interpreted imaging which showed clear I agree with the radiologist interpretation CT abd/pel: No acute findings, per radiology interpretation   Cardiac Monitoring:  The patient was maintained on a cardiac monitor.  I personally viewed and interpreted the cardiac monitored which showed an underlying rhythm of: Sinus tachycardia    Critical Interventions:  Lactic acid elevation x 2 (2nd test after 2 liters fluids), blood pressure low (94 systolic) after fluids - patient considered undifferentiated sepsis. He has received Rocephin  IV. IV NS infusion started.    Problem List / ED Course:  Progressive MSK soreness x 1 week without other symptoms Has evidence pointing to sepsis with elevated lactic acid, hypotension despite fluids resuscitation Cultures pending, abx started   Reevaluation:  After the interventions noted above, I reevaluated the patient and found that they have :stayed the same   Social Determinants of Health:  Lives with son   Disposition:  After consideration of the diagnostic results and the patients response to treatment, I feel that the patient  would benefit from admit. Discussed with Dr. Charlton, [facility], who accepts for admission.   Amount and/or Complexity of Data Reviewed Labs: ordered.  Risk Prescription drug management. Decision regarding hospitalization.           Final Clinical Impression(s) / ED Diagnoses Final diagnoses:  SIRS (systemic inflammatory response syndrome) [facility])    Rx / DC Orders ED Discharge Orders     None         Odell Balls, PA-C 01/02/24 1938    Bari Roxie HERO, DO 01/02/24 2304

## 2024-01-03 DIAGNOSIS — Z961 Presence of intraocular lens: Secondary | ICD-10-CM | POA: Diagnosis present

## 2024-01-03 DIAGNOSIS — M353 Polymyalgia rheumatica: Secondary | ICD-10-CM | POA: Diagnosis present

## 2024-01-03 DIAGNOSIS — Z955 Presence of coronary angioplasty implant and graft: Secondary | ICD-10-CM | POA: Diagnosis not present

## 2024-01-03 DIAGNOSIS — Z1152 Encounter for screening for COVID-19: Secondary | ICD-10-CM | POA: Diagnosis not present

## 2024-01-03 DIAGNOSIS — E7800 Pure hypercholesterolemia, unspecified: Secondary | ICD-10-CM | POA: Diagnosis present

## 2024-01-03 DIAGNOSIS — Z79899 Other long term (current) drug therapy: Secondary | ICD-10-CM | POA: Diagnosis not present

## 2024-01-03 DIAGNOSIS — I2510 Atherosclerotic heart disease of native coronary artery without angina pectoris: Secondary | ICD-10-CM | POA: Diagnosis present

## 2024-01-03 DIAGNOSIS — Z95828 Presence of other vascular implants and grafts: Secondary | ICD-10-CM | POA: Diagnosis not present

## 2024-01-03 DIAGNOSIS — I252 Old myocardial infarction: Secondary | ICD-10-CM | POA: Diagnosis not present

## 2024-01-03 DIAGNOSIS — Z951 Presence of aortocoronary bypass graft: Secondary | ICD-10-CM | POA: Diagnosis not present

## 2024-01-03 DIAGNOSIS — R6510 Systemic inflammatory response syndrome (SIRS) of non-infectious origin without acute organ dysfunction: Secondary | ICD-10-CM | POA: Diagnosis present

## 2024-01-03 DIAGNOSIS — Z8249 Family history of ischemic heart disease and other diseases of the circulatory system: Secondary | ICD-10-CM | POA: Diagnosis not present

## 2024-01-03 DIAGNOSIS — Z7902 Long term (current) use of antithrombotics/antiplatelets: Secondary | ICD-10-CM | POA: Diagnosis not present

## 2024-01-03 DIAGNOSIS — I708 Atherosclerosis of other arteries: Secondary | ICD-10-CM | POA: Diagnosis present

## 2024-01-03 DIAGNOSIS — E876 Hypokalemia: Secondary | ICD-10-CM | POA: Diagnosis present

## 2024-01-03 DIAGNOSIS — N1339 Other hydronephrosis: Secondary | ICD-10-CM | POA: Diagnosis not present

## 2024-01-03 DIAGNOSIS — N179 Acute kidney failure, unspecified: Secondary | ICD-10-CM | POA: Diagnosis not present

## 2024-01-03 DIAGNOSIS — I10 Essential (primary) hypertension: Secondary | ICD-10-CM | POA: Diagnosis present

## 2024-01-03 DIAGNOSIS — Z833 Family history of diabetes mellitus: Secondary | ICD-10-CM | POA: Diagnosis not present

## 2024-01-03 DIAGNOSIS — Z9079 Acquired absence of other genital organ(s): Secondary | ICD-10-CM | POA: Diagnosis not present

## 2024-01-03 DIAGNOSIS — E872 Acidosis, unspecified: Secondary | ICD-10-CM | POA: Diagnosis present

## 2024-01-03 DIAGNOSIS — N1330 Unspecified hydronephrosis: Secondary | ICD-10-CM | POA: Diagnosis not present

## 2024-01-03 LAB — COMPREHENSIVE METABOLIC PANEL
ALT: 39 U/L (ref 0–44)
AST: 38 U/L (ref 15–41)
Albumin: 2.6 g/dL — ABNORMAL LOW (ref 3.5–5.0)
Alkaline Phosphatase: 62 U/L (ref 38–126)
Anion gap: 10 (ref 5–15)
BUN: 12 mg/dL (ref 8–23)
CO2: 25 mmol/L (ref 22–32)
Calcium: 8.7 mg/dL — ABNORMAL LOW (ref 8.9–10.3)
Chloride: 102 mmol/L (ref 98–111)
Creatinine, Ser: 0.84 mg/dL (ref 0.61–1.24)
GFR, Estimated: >60 mL/min (ref >60)
Glucose, Bld: 114 mg/dL — ABNORMAL HIGH (ref 70–99)
Potassium: 3.1 mmol/L — ABNORMAL LOW (ref 3.5–5.1)
Sodium: 137 mmol/L (ref 135–145)
Total Bilirubin: 1.5 mg/dL — ABNORMAL HIGH (ref 0.0–1.2)
Total Protein: 6.2 g/dL — ABNORMAL LOW (ref 6.5–8.1)

## 2024-01-03 LAB — CBC
HCT: 44.7 % (ref 39.0–52.0)
Hemoglobin: 15.2 g/dL (ref 13.0–17.0)
MCH: 31.2 pg (ref 26.0–34.0)
MCHC: 34 g/dL (ref 30.0–36.0)
MCV: 91.8 fL (ref 80.0–100.0)
Platelets: 283 10*3/uL (ref 150–400)
RBC: 4.87 MIL/uL (ref 4.22–5.81)
RDW: 13.4 % (ref 11.5–15.5)
WBC: 11.2 10*3/uL — ABNORMAL HIGH (ref 4.0–10.5)
nRBC: 0 % (ref 0.0–0.2)

## 2024-01-03 MED ORDER — POTASSIUM CHLORIDE 20 MEQ PO PACK
40.0000 meq | PACK | Freq: Once | ORAL | Status: AC
  Administered 2024-01-03: 40 meq via ORAL
  Filled 2024-01-03: qty 2

## 2024-01-03 NOTE — Progress Notes (Signed)
 Mobility Specialist Progress Note:    01/03/24 1600  Mobility  Activity Ambulated with assistance in room  Level of Assistance Minimal assist, patient does 75% or more  Assistive Device Front wheel walker  Distance Ambulated (ft) 18 ft  Activity Response Tolerated well  Mobility Referral Yes  Mobility visit 1 Mobility  Mobility Specialist Start Time (ACUTE ONLY) 1544  Mobility Specialist Stop Time (ACUTE ONLY) 1555  Mobility Specialist Time Calculation (min) (ACUTE ONLY) 11 min   Pt received in bed and agreeable. Able to stand and ambulate to sink w/ minA. Took x1 seated rest break. Able to ambulate back to bed. No complaints throughout. Pt left in bed w/ call bell and all needs met. Bed alarm on.  D'Vante Nolon Baxter Mobility Specialist Please contact via Special educational needs teacher or Rehab office at [phone]

## 2024-01-03 NOTE — ED Notes (Signed)
 Pt repositioned in bed. HOB elevated. Call light in reach. No needs at this time. Safety precautions maintained.

## 2024-01-03 NOTE — Evaluation (Signed)
 Physical Therapy Evaluation Patient Details Name: Robert [name] MRN: [id number] DOB: [birth year] Today's Date: 01/03/2024  History of Present Illness  83 y.o. male presents to [facility] hospital on 01/02/2024 aches and malaise. Respiratory panel was negative. PMH includes HTN, HLD, CABG.  Clinical Impression  Pt presents to PT with deficits in functional mobility, strength, power, endurance, gait, balance. Pt demonstrates generalized weakness, reporting continued aches with mobility. Pt has mobilized minimally over the last few days, largely remaining in the bed. Pt requires assistance for bed mobility and demonstrates a posterior lean in standing and when ambulating. Pt is at a high risk for falls at this time. Patient will benefit from continued inpatient follow up therapy, <3 hours/day. If the source of possible infection is identified and treated the pt may progress quickly.      If plan is discharge home, recommend the following: A little help with walking and/or transfers;A lot of help with bathing/dressing/bathroom;Assistance with cooking/housework;Assist for transportation   Can travel by private vehicle   Yes    Equipment Recommendations Rolling walker (2 wheels);BSC/3in1  Recommendations for Other Services       Functional Status Assessment Patient has had a recent decline in their functional status and demonstrates the ability to make significant improvements in function in a reasonable and predictable amount of time.     Precautions / Restrictions Precautions Precautions: Fall Restrictions Weight Bearing Restrictions Per Provider Order: No      Mobility  Bed Mobility Overal bed mobility: Needs Assistance Bed Mobility: Supine to Sit, Sit to Supine     Supine to sit: Min assist, HOB elevated Sit to supine: Min assist        Transfers Overall transfer level: Needs assistance Equipment used: Rolling walker (2 wheels) Transfers: Sit to/from Stand Sit to Stand: Min  assist           General transfer comment: posterior lean    Ambulation/Gait Ambulation/Gait assistance: Min assist Gait Distance (Feet): 3 Feet Assistive device: Rolling walker (2 wheels) Gait Pattern/deviations: Step-to pattern Gait velocity: reduced Gait velocity interpretation: <1.31 ft/sec, indicative of household ambulator   General Gait Details: slowed step-to gait, posterior lean  Stairs            Wheelchair Mobility     Tilt Bed    Modified Rankin (Stroke Patients Only)       Balance Overall balance assessment: Needs assistance Sitting-balance support: No upper extremity supported, Feet supported Sitting balance-Leahy Scale: Good     Standing balance support: Bilateral upper extremity supported, Reliant on assistive device for balance Standing balance-Leahy Scale: Poor                               Pertinent Vitals/Pain Pain Assessment Pain Assessment: Faces Faces Pain Scale: Hurts even more Pain Location: generalized aches Pain Descriptors / Indicators: Aching Pain Intervention(s): Monitored during session    Home Living Family/patient expects to be discharged to:: Private residence Living Arrangements: Children Available Help at Discharge: Family;Available 24 hours/day Type of Home: Apartment Home Access: Level entry       Home Layout: One level Home Equipment: Rollator (4 wheels);Wheelchair - manual      Prior Function Prior Level of Function : Independent/Modified Independent;Driving             Mobility Comments: was caring for his spouse who passed away in 2023-11-14      Extremity/Trunk Assessment   Upper  Extremity Assessment Upper Extremity Assessment: Generalized weakness    Lower Extremity Assessment Lower Extremity Assessment: Generalized weakness    Cervical / Trunk Assessment Cervical / Trunk Assessment: Kyphotic  Communication   Communication Communication: No apparent  difficulties Cueing Techniques: Verbal cues  Cognition Arousal: Alert Behavior During Therapy: WFL for tasks assessed/performed Overall Cognitive Status: Within Functional Limits for tasks assessed                                          General Comments General comments (skin integrity, edema, etc.): VSS on RA    Exercises     Assessment/Plan    PT Assessment Patient needs continued PT services  PT Problem List Decreased strength;Decreased activity tolerance;Decreased balance;Decreased mobility;Decreased knowledge of use of DME;Decreased safety awareness;Decreased knowledge of precautions;Pain       PT Treatment Interventions DME instruction;Gait training;Functional mobility training;Therapeutic activities;Therapeutic exercise;Balance training;Neuromuscular re-education;Patient/family education    PT Goals (Current goals can be found in the Care Plan section)  Acute Rehab PT Goals Patient Stated Goal: to return to independence PT Goal Formulation: With patient Time For Goal Achievement: 01/17/24 Potential to Achieve Goals: Good    Frequency Min 1X/week     Co-evaluation               AM-PAC PT "6 Clicks" Mobility  Outcome Measure Help needed turning from your back to your side while in a flat bed without using bedrails?: A Little Help needed moving from lying on your back to sitting on the side of a flat bed without using bedrails?: A Little Help needed moving to and from a bed to a chair (including a wheelchair)?: A Little Help needed standing up from a chair using your arms (e.g., wheelchair or bedside chair)?: A Little Help needed to walk in hospital room?: Total Help needed climbing 3-5 steps with a railing? : Total 6 Click Score: 14    End of Session Equipment Utilized During Treatment: Gait belt Activity Tolerance: Patient limited by fatigue Patient left: in bed;with call bell/phone within reach Nurse Communication: Mobility status PT  Visit Diagnosis: Other abnormalities of gait and mobility (R26.89);Muscle weakness (generalized) (M62.81)    Time: 4098-1191 PT Time Calculation (min) (ACUTE ONLY): 16 min   Charges:   PT Evaluation $PT Eval Low Complexity: 1 Low   PT General Charges $$ ACUTE PT VISIT: 1 Visit         Rexie Catena, PT, DPT Acute Rehabilitation Office [phone]   Rexie Catena 01/03/2024, 9:15 AM

## 2024-01-03 NOTE — Progress Notes (Addendum)
 PROGRESS NOTE    Robert [name]  ZOX:[id number] DOB: Feb 11, 1941 DOA: 01/02/2024 PCP: Jearldine Mina, MD   Brief Narrative: This 83 yrs old Male with PMH significant for hypertension, hyperlipidemia, right subclavian artery stenosis status post stenting in 2016, CAD with CABG in 2008 who presents with generalized aches and malaise.  Patient has developed right shoulder pain a week ago and then it spread to involve the neck , left shoulder , hip and lower extremities.  Labs notable for WBC 14.7, Hb 17.2, lactic acid 2.5, normal CK, UA no bacteriuria or pyuria.  Chest x-ray no acute findings, CT A&P unremarkable.  Patient was given a dose of IV Rocephin ,  started on IV fluids and admitted for further evaluation with a concern for polymyalgia rheumatica.  Assessment & Plan:   Principal Problem:   SIRS (systemic inflammatory response syndrome) (HCC) Active Problems:   CAD (coronary artery disease)   Essential (primary) hypertension  SIRS: Generalized aches / Malaise: Patient presented with generalized body ache, malaise, more severe in the neck and shoulders but also involving hip and lower extremities.   He was found to have multiple SIRS criteria concerning for possible infection.  So far no infection has been found on broad ED workup.   Serum CK level is normal. RSV, COVID, flu negative, Follow-up blood cultures,  CRP mildly elevated, procalcitonin normal, Check ESR, RF, anti-CCP, TSH,  follow-up blood and urine culture.   Follow-up clinical course.  Consider steroids if cultures negative and no infectious process identified. Needs PT and OT evaluation  CAD: Patient denies any chest pain, Continue Plavix  and Imdur   Essential hypertension: Hold blood pressure medication his blood pressure is on the soft side.  Hypokalemia: Replaced.  Continue to monitor  DVT prophylaxis: Lovenox  Code Status: Full code Family Communication: No family at bedside Disposition Plan:Status is:  Observation The patient remains OBS appropriate and will d/c before 2 midnights.  Admitted for generalized malaise, generalized body ache concerning for polymyalgia rheumatica workup in progress.  Needs PT and OT evaluation   Consultants:  None  Procedures: CT A/P  Antimicrobials: Anti-infectives (From admission, onward)    Start     Dose/Rate Route Frequency Ordered Stop   01/02/24 1515  cefTRIAXone  (ROCEPHIN ) 1 g in sodium chloride  0.9 % 100 mL IVPB        1 g 200 mL/hr over 30 Minutes Intravenous  Once 01/02/24 1509 01/02/24 1817      Subjective: Patient was seen and examined at bedside.  Overnight events noted. Patient reports having pain in the shoulders and hips.  Patient denies any fall or injury.  Objective: Vitals:   01/03/24 0500 01/03/24 0529 01/03/24 0715 01/03/24 0945  BP: (!) 147/83  139/77   Pulse: (!) 101  93   Resp:   17   Temp:  98.3 F (36.8 C)  98.6 F (37 C)  TempSrc:  Oral  Oral  SpO2: 95%  94%     Intake/Output Summary (Last 24 hours) at 01/03/2024 1058 Last data filed at 01/02/2024 1927 Gross per 24 hour  Intake 2100 ml  Output 210 ml  Net 1890 ml   There were no vitals filed for this visit.  Examination:  General exam: Appears calm and comfortable, deconditioned, not in any acute distress. Respiratory system: Clear to auscultation. Respiratory effort normal.  RR 15 Cardiovascular system: S1 & S2 heard, RRR. No JVD, murmurs, rubs, gallops or clicks. Gastrointestinal system: Abdomen is non distended, soft and non tender.  Normal  bowel sounds heard. Central nervous system: Alert and oriented X 3. No focal neurological deficits. Extremities: No edema, no cyanosis, no clubbing Skin: No rashes, lesions or ulcers Psychiatry: Judgement and insight appear normal. Mood & affect appropriate.     Data Reviewed: I have personally reviewed following labs and imaging studies  CBC: Recent Labs  Lab 01/02/24 1319 01/03/24 0519  WBC 14.7* 11.2*   NEUTROABS 10.4*  --   HGB 17.2* 15.2  HCT 50.4 44.7  MCV 91.3 91.8  PLT 351 283   Basic Metabolic Panel: Recent Labs  Lab 01/02/24 1319 01/03/24 0519  NA 133* 137  K 3.6 3.1*  CL 95* 102  CO2 26 25  GLUCOSE 149* 114*  BUN 21 12  CREATININE 1.11 0.84  CALCIUM  9.4 8.7*   GFR: CrCl cannot be calculated (Unknown ideal weight.). Liver Function Tests: Recent Labs  Lab 01/02/24 1319 01/03/24 0519  AST 46* 38  ALT 52* 39  ALKPHOS 76 62  BILITOT 1.6* 1.5*  PROT 7.8 6.2*  ALBUMIN 3.3* 2.6*   No results for input(s): "LIPASE", "AMYLASE" in the last 168 hours. No results for input(s): "AMMONIA" in the last 168 hours. Coagulation Profile: No results for input(s): "INR", "PROTIME" in the last 168 hours. Cardiac Enzymes: Recent Labs  Lab 01/02/24 1319  CKTOTAL 192   BNP (last 3 results) No results for input(s): "PROBNP" in the last 8760 hours. HbA1C: No results for input(s): "HGBA1C" in the last 72 hours. CBG: No results for input(s): "GLUCAP" in the last 168 hours. Lipid Profile: No results for input(s): "CHOL", "HDL", "LDLCALC", "TRIG", "CHOLHDL", "LDLDIRECT" in the last 72 hours. Thyroid Function Tests: Recent Labs    01/02/24 2120  TSH 2.479   Anemia Panel: No results for input(s): "VITAMINB12", "FOLATE", "FERRITIN", "TIBC", "IRON", "RETICCTPCT" in the last 72 hours. Sepsis Labs: Recent Labs  Lab 01/02/24 1338 01/02/24 1720 01/02/24 2120  PROCALCITON  --   --  0.10  LATICACIDVEN 2.5* 2.7* 1.3    Recent Results (from the past 240 hours)  Resp panel by RT-PCR (RSV, Flu A&B, Covid) Anterior Nasal Swab     Status: None   Collection Time: 01/02/24 11:46 AM   Specimen: Anterior Nasal Swab  Result Value Ref Range Status   SARS Coronavirus 2 by RT PCR NEGATIVE NEGATIVE Final   Influenza A by PCR NEGATIVE NEGATIVE Final   Influenza B by PCR NEGATIVE NEGATIVE Final    Comment: (NOTE) The Xpert Xpress SARS-CoV-2/FLU/RSV plus assay is intended as an aid in  the diagnosis of influenza from Nasopharyngeal swab specimens and should not be used as a sole basis for treatment. Nasal washings and aspirates are unacceptable for Xpert Xpress SARS-CoV-2/FLU/RSV testing.  Fact Sheet for Patients: BloggerCourse.com  Fact Sheet for Healthcare Providers: SeriousBroker.it  This test is not yet approved or cleared by the [place]  FDA and has been authorized for detection and/or diagnosis of SARS-CoV-2 by FDA under an Emergency Use Authorization (EUA). This EUA will remain in effect (meaning this test can be used) for the duration of the COVID-19 declaration under Section 564(b)(1) of the Act, 21 U.S.C. section 360bbb-3(b)(1), unless the authorization is terminated or revoked.     Resp Syncytial Virus by PCR NEGATIVE NEGATIVE Final    Comment: (NOTE) Fact Sheet for Patients: BloggerCourse.com  Fact Sheet for Healthcare Providers: SeriousBroker.it  This test is not yet approved or cleared by the [place]  FDA and has been authorized for detection and/or diagnosis of SARS-CoV-2 by FDA  under an Emergency Use Authorization (EUA). This EUA will remain in effect (meaning this test can be used) for the duration of the COVID-19 declaration under Section 564(b)(1) of the Act, 21 U.S.C. section 360bbb-3(b)(1), unless the authorization is terminated or revoked.  Performed at [facility], [address] [place], [place] [address]   Blood culture (routine x 2)     Status: None (Preliminary result)   Collection Time: 01/02/24 12:03 PM   Specimen: BLOOD  Result Value Ref Range Status   Specimen Description BLOOD LEFT ANTECUBITAL  Final   Special Requests   Final    BOTTLES DRAWN AEROBIC AND ANAEROBIC Blood Culture adequate volume   Culture   Final    NO GROWTH < 24 HOURS Performed at [facility], [address], [place], [place]  [address]    Report Status PENDING  Incomplete  Blood culture (routine x 2)     Status: None (Preliminary result)   Collection Time: 01/02/24  1:33 PM   Specimen: BLOOD  Result Value Ref Range Status   Specimen Description BLOOD RIGHT ANTECUBITAL  Final   Special Requests   Final    BOTTLES DRAWN AEROBIC AND ANAEROBIC Blood Culture results may not be optimal due to an inadequate volume of blood received in culture bottles   Culture   Final    NO GROWTH < 24 HOURS Performed at [facility], [address] [place], [place] [address]    Report Status PENDING  Incomplete    Radiology Studies: CT ABDOMEN PELVIS W CONTRAST Result Date: 01/02/2024 CLINICAL DATA:  Nonlocalized abdomen pain EXAM: CT ABDOMEN AND PELVIS WITH CONTRAST TECHNIQUE: Multidetector CT imaging of the abdomen and pelvis was performed using the standard protocol following bolus administration of intravenous contrast. RADIATION DOSE REDUCTION: This exam was performed according to the departmental dose-optimization program which includes automated exposure control, adjustment of the mA and/or kV according to patient size and/or use of iterative reconstruction technique. CONTRAST:  75mL OMNIPAQUE  IOHEXOL  350 MG/ML SOLN COMPARISON:  None Available. FINDINGS: Lower chest: Lung bases demonstrate mild dependent atelectasis. No consolidation or effusion. Coronary vascular calcification. Hepatobiliary: No calcified gallstone or biliary dilatation Pancreas: Unremarkable. No pancreatic ductal dilatation or surrounding inflammatory changes. Spleen: Normal in size without focal abnormality. Adrenals/Urinary Tract: Adrenal glands are normal. Kidneys show no hydronephrosis. Subcentimeter hypodensities for which no imaging follow-up is recommended. Bladder is normal Stomach/Bowel: Stomach is within normal limits. Appendix appears normal. No evidence of bowel wall thickening, distention, or inflammatory changes. Diverticular disease of the colon.  Vascular/Lymphatic: Severe aortic atherosclerosis with moderate mural thickening at the distal infrarenal abdominal aorta and iliac bifurcation. No aneurysm. No suspicious lymph nodes Reproductive: Enlarged prostate with probable TURP defect Other: Negative for pelvic effusion or free air Musculoskeletal: No acute or suspicious osseous abnormality IMPRESSION: 1. No CT evidence for acute intra-abdominal or pelvic abnormality. 2. Diverticular disease of the colon without acute inflammatory process. 3. Aortic atherosclerosis. Aortic Atherosclerosis (ICD10-I70.0). Electronically Signed   By: Esmeralda Hedge M.D.   On: 01/02/2024 17:02   DG Chest 1 View Result Date: 01/02/2024 CLINICAL DATA:  Cough EXAM: CHEST  1 VIEW COMPARISON:  X-ray 08/03/2018 FINDINGS: Elevation of the left hemidiaphragm. Mild basilar atelectasis bilaterally. No pneumothorax, edema or consolidation. Normal cardiopericardial silhouette with a calcified aorta. Sternal wires. IMPRESSION: Postop chest.  Elevated left hemidiaphragm.  Basilar atelectasis. Electronically Signed   By: Adrianna Horde M.D.   On: 01/02/2024 12:51   Scheduled  Meds:  clopidogrel   75 mg Oral Daily   enoxaparin  (LOVENOX ) injection  40 mg Subcutaneous Q24H   isosorbide  mononitrate  60 mg Oral Daily   sodium chloride  flush  3 mL Intravenous Q12H   Continuous Infusions:   LOS: 0 days    Time spent: 50 mins    Magdalene School, MD [facility]   If 7PM-7AM, please contact night-coverage

## 2024-01-03 NOTE — ED Notes (Signed)
 ED TO INPATIENT HANDOFF REPORT  ED Nurse Name and Phone #: Ethyl Hering [phone]  S Name/Age/Gender Robert [name] 83 y.o. male Room/Bed: 011C/011C  Code Status   Code Status: Full Code  Home/SNF/Other Home Patient oriented to: self, place, time, and situation Is this baseline? Yes   Triage Complete: Triage complete  Chief Complaint SIRS (systemic inflammatory response syndrome) (HCC) [R65.10]  Triage Note Pt arrives via EMS from home. Pt reports body aches, fatigue, fever, and generalized weakness for about 1 week. Pt AxOx4.    Allergies No Known Allergies  Level of Care/Admitting Diagnosis ED Disposition     ED Disposition  Admit   Condition  --   Comment  Hospital Area: [facility] [[id number]]  Level of Care: Telemetry Medical [104]  May place patient in observation at [facility] or [facility] if equivalent level of care is available:: Yes  Covid Evaluation: Confirmed COVID Negative  Diagnosis: SIRS (systemic inflammatory response syndrome) [facility]) [[id number]]  Admitting Physician: Walton Guppy [[id number]]  Attending Physician: Walton Guppy [[id number]]          B Medical/Surgery History Past Medical History:  Diagnosis Date   Arthritis    "a little in my fingers; never tx'd" (07/17/2018)   CAD (coronary artery disease)    a. CABG X4 2008. b. MI and stent (in [place]) b. PCI/DES to RCA (07/12/18) c. PCI/DES to LM-ramus (07/17/18)   Foot fracture, right 1996   High cholesterol    Hypertension    Leg hematoma 01/2018   RLE; "no tx"   Presence of arterial stent 2016   Right subclavian artery   Skipped heart beats    Stenosis of subclavian artery (HCC)    a. s/p R subclavian stent 2016.   Past Surgical History:  Procedure Laterality Date   AORTIC ARCH ANGIOGRAPHY N/A 07/10/2018   Procedure: AORTIC ARCH ANGIOGRAPHY;  Surgeon: Arleen Lacer, MD;  Location: [facility] INVASIVE CV LAB;  Service: Cardiovascular;  Laterality: N/A;   CATARACT EXTRACTION W/  INTRAOCULAR LENS  IMPLANT, BILATERAL Bilateral    CORONARY ANGIOGRAPHY N/A 07/18/2018   Procedure: CORONARY ANGIOGRAPHY;  Surgeon: Arleen Lacer, MD;  Location: [facility] INVASIVE CV LAB;  Service: Cardiovascular;  Laterality: N/A;   CORONARY ANGIOPLASTY WITH STENT PLACEMENT  2016   CORONARY ARTERY BYPASS GRAFT  2008   "CABG X4"   CORONARY ATHERECTOMY N/A 07/18/2018   Procedure: CORONARY ATHERECTOMY;  Surgeon: Arleen Lacer, MD;  Location: [facility] INVASIVE CV LAB;  Service: Cardiovascular;  Laterality: N/A;   CORONARY STENT INTERVENTION N/A 07/12/2018   Procedure: CORONARY STENT INTERVENTION;  Surgeon: Arleen Lacer, MD;  Location: [facility] INVASIVE CV LAB;  Service: Cardiovascular;  Laterality: N/A;   INGUINAL HERNIA REPAIR Right 08/27/2020   Procedure: OPEN RIGHT INGUINAL HERNIA REPAIR WITH MESH;  Surgeon: Oza Blumenthal, MD;  Location: [facility];  Service: General;  Laterality: Right;   LEFT HEART CATH AND CORS/GRAFTS ANGIOGRAPHY N/A 07/10/2018   Procedure: LEFT HEART CATH AND CORS/GRAFTS ANGIOGRAPHY;  Surgeon: Arleen Lacer, MD;  Location: [facility] INVASIVE CV LAB;  Service: Cardiovascular;  Laterality: N/A;   PROSTATECTOMY  ~ 2012   "removed most of it; no cancer" (07/17/2018)   SUBCLAVIAN ARTERY STENT Right 2016     A IV Location/Drains/Wounds Patient Lines/Drains/Airways Status     Active Line/Drains/Airways     Name Placement date Placement time Site Days   Peripheral IV 01/02/24 20 G Left Antecubital 01/02/24  1312  Antecubital  1   Peripheral IV 01/03/24 22 G 1" Distal;Posterior;Right Forearm 01/03/24  --  Forearm  less than 1   Incision (Closed) 08/27/20 Groin Right 08/27/20  1117  -- 1224            Intake/Output Last 24 hours  Intake/Output Summary (Last 24 hours) at 01/03/2024 1315 Last data filed at 01/02/2024 1927 Gross per 24 hour  Intake 2100 ml  Output 210 ml  Net 1890 ml    Labs/Imaging Results for orders placed or performed during the hospital  encounter of 01/02/24 (from the past 48 hours)  Resp panel by RT-PCR (RSV, Flu A&B, Covid) Anterior Nasal Swab     Status: None   Collection Time: 01/02/24 11:46 AM   Specimen: Anterior Nasal Swab  Result Value Ref Range   SARS Coronavirus 2 by RT PCR NEGATIVE NEGATIVE   Influenza A by PCR NEGATIVE NEGATIVE   Influenza B by PCR NEGATIVE NEGATIVE    Comment: (NOTE) The Xpert Xpress SARS-CoV-2/FLU/RSV plus assay is intended as an aid in the diagnosis of influenza from Nasopharyngeal swab specimens and should not be used as a sole basis for treatment. Nasal washings and aspirates are unacceptable for Xpert Xpress SARS-CoV-2/FLU/RSV testing.  Fact Sheet for Patients: BloggerCourse.com  Fact Sheet for Healthcare Providers: SeriousBroker.it  This test is not yet approved or cleared by the [place]  FDA and has been authorized for detection and/or diagnosis of SARS-CoV-2 by FDA under an Emergency Use Authorization (EUA). This EUA will remain in effect (meaning this test can be used) for the duration of the COVID-19 declaration under Section 564(b)(1) of the Act, 21 U.S.C. section 360bbb-3(b)(1), unless the authorization is terminated or revoked.     Resp Syncytial Virus by PCR NEGATIVE NEGATIVE    Comment: (NOTE) Fact Sheet for Patients: BloggerCourse.com  Fact Sheet for Healthcare Providers: SeriousBroker.it  This test is not yet approved or cleared by the [place]  FDA and has been authorized for detection and/or diagnosis of SARS-CoV-2 by FDA under an Emergency Use Authorization (EUA). This EUA will remain in effect (meaning this test can be used) for the duration of the COVID-19 declaration under Section 564(b)(1) of the Act, 21 U.S.C. section 360bbb-3(b)(1), unless the authorization is terminated or revoked.  Performed at [facility], [address]  [place], [place] [address]   Blood culture (routine x 2)     Status: None (Preliminary result)   Collection Time: 01/02/24 12:03 PM   Specimen: BLOOD  Result Value Ref Range   Specimen Description BLOOD LEFT ANTECUBITAL    Special Requests      BOTTLES DRAWN AEROBIC AND ANAEROBIC Blood Culture adequate volume   Culture      NO GROWTH < 24 HOURS Performed at [facility], [address], [place], [place] [address]    Report Status PENDING   Comprehensive metabolic panel     Status: Abnormal   Collection Time: 01/02/24  1:19 PM  Result Value Ref Range   Sodium 133 (L) 135 - 145 mmol/L   Potassium 3.6 3.5 - 5.1 mmol/L   Chloride 95 (L) 98 - 111 mmol/L   CO2 26 22 - 32 mmol/L   Glucose, Bld 149 (H) 70 - 99 mg/dL    Comment: Glucose reference range applies only to samples taken after fasting for at least 8 hours.   BUN 21 8 - 23 mg/dL   Creatinine, Ser 0.98 0.61 - 1.24 mg/dL   Calcium  9.4 8.9 -  10.3 mg/dL   Total Protein 7.8 6.5 - 8.1 g/dL   Albumin 3.3 (L) 3.5 - 5.0 g/dL   AST 46 (H) 15 - 41 U/L   ALT 52 (H) 0 - 44 U/L   Alkaline Phosphatase 76 38 - 126 U/L   Total Bilirubin 1.6 (H) 0.0 - 1.2 mg/dL   GFR, Estimated >16 >10 mL/min    Comment: (NOTE) Calculated using the CKD-EPI Creatinine Equation (2021)    Anion gap 12 5 - 15    Comment: Performed at [facility], [address], [place], [place] [address]  CBC with Differential     Status: Abnormal   Collection Time: 01/02/24  1:19 PM  Result Value Ref Range   WBC 14.7 (H) 4.0 - 10.5 K/uL   RBC 5.52 4.22 - 5.81 MIL/uL   Hemoglobin 17.2 (H) 13.0 - 17.0 g/dL   HCT 40.9 81.1 - 91.4 %   MCV 91.3 80.0 - 100.0 fL   MCH 31.2 26.0 - 34.0 pg   MCHC 34.1 30.0 - 36.0 g/dL   RDW 78.2 95.6 - 21.3 %   Platelets 351 150 - 400 K/uL   nRBC 0.0 0.0 - 0.2 %   Neutrophils Relative % 71 %   Neutro Abs 10.4 (H) 1.7 - 7.7 K/uL   Lymphocytes Relative 17 %   Lymphs Abs 2.5 0.7 - 4.0 K/uL   Monocytes Relative 10 %   Monocytes  Absolute 1.5 (H) 0.1 - 1.0 K/uL   Eosinophils Relative 1 %   Eosinophils Absolute 0.1 0.0 - 0.5 K/uL   Basophils Relative 1 %   Basophils Absolute 0.1 0.0 - 0.1 K/uL   Immature Granulocytes 0 %   Abs Immature Granulocytes 0.06 0.00 - 0.07 K/uL    Comment: Performed at [facility], [address] [place], [place] [address]  CK     Status: None   Collection Time: 01/02/24  1:19 PM  Result Value Ref Range   Total CK 192 49 - 397 U/L    Comment: Performed at [facility], [address], [place], [place] [address]  Blood culture (routine x 2)     Status: None (Preliminary result)   Collection Time: 01/02/24  1:33 PM   Specimen: BLOOD  Result Value Ref Range   Specimen Description BLOOD RIGHT ANTECUBITAL    Special Requests      BOTTLES DRAWN AEROBIC AND ANAEROBIC Blood Culture results may not be optimal due to an inadequate volume of blood received in culture bottles   Culture      NO GROWTH < 24 HOURS Performed at [facility], [address] [place], [place] [address]    Report Status PENDING   I-Stat CG4 Lactic Acid     Status: Abnormal   Collection Time: 01/02/24  1:38 PM  Result Value Ref Range   Lactic Acid, Venous 2.5 (HH) 0.5 - 1.9 mmol/L   Comment NOTIFIED PHYSICIAN   Urinalysis, w/ Reflex to Culture (Infection Suspected) -Urine, Clean Catch     Status: Abnormal   Collection Time: 01/02/24  4:00 PM  Result Value Ref Range   Specimen Source URINE, CLEAN CATCH    Color, Urine AMBER (A) YELLOW    Comment: BIOCHEMICALS MAY BE AFFECTED BY COLOR   APPearance HAZY (A) CLEAR   Specific Gravity, Urine 1.021 1.005 - 1.030   pH 5.0 5.0 - 8.0   Glucose, UA NEGATIVE NEGATIVE mg/dL   Hgb urine dipstick  NEGATIVE NEGATIVE   Bilirubin Urine NEGATIVE NEGATIVE   Ketones, ur NEGATIVE NEGATIVE mg/dL   Protein, ur 30 (A) NEGATIVE mg/dL   Nitrite NEGATIVE NEGATIVE   Leukocytes,Ua NEGATIVE NEGATIVE   RBC / HPF 0-5 0 - 5 RBC/hpf   WBC, UA 0-5 0 - 5 WBC/hpf    Comment:         Reflex urine culture not performed if WBC <=10, OR if Squamous epithelial cells >5. If Squamous epithelial cells >5 suggest recollection.    Bacteria, UA RARE (A) NONE SEEN   Squamous Epithelial / HPF 0-5 0 - 5 /HPF   Mucus PRESENT     Comment: Performed at [facility], [address] [place], [place] [address]  I-Stat CG4 Lactic Acid     Status: Abnormal   Collection Time: 01/02/24  5:20 PM  Result Value Ref Range   Lactic Acid, Venous 2.7 (HH) 0.5 - 1.9 mmol/L   Comment NOTIFIED PHYSICIAN   Lactic acid, plasma     Status: None   Collection Time: 01/02/24  9:20 PM  Result Value Ref Range   Lactic Acid, Venous 1.3 0.5 - 1.9 mmol/L    Comment: Performed at [facility], [address], [place], [place] [address]  Sedimentation rate     Status: Abnormal   Collection Time: 01/02/24  9:20 PM  Result Value Ref Range   Sed Rate 62 (H) 0 - 16 mm/hr    Comment: Performed at [facility], [address], [place], [place] [address]  C-reactive protein     Status: Abnormal   Collection Time: 01/02/24  9:20 PM  Result Value Ref Range   CRP 16.5 (H) <1.0 mg/dL    Comment: Performed at [facility], [address] [place], [place] [address]  TSH     Status: None   Collection Time: 01/02/24  9:20 PM  Result Value Ref Range   TSH 2.479 0.350 - 4.500 uIU/mL    Comment: Performed by a 3rd Generation assay with a functional sensitivity of <=0.01 uIU/mL. Performed at [facility], [address], [place], [place] [address]   Procalcitonin     Status: None   Collection Time: 01/02/24  9:20 PM  Result Value Ref Range   Procalcitonin 0.10 ng/mL    Comment:        Interpretation: PCT (Procalcitonin) <= 0.5 ng/mL: Systemic infection (sepsis) is not likely. Local bacterial infection is possible. (NOTE)       Sepsis PCT Algorithm           Lower Respiratory Tract                                      Infection PCT Algorithm    ----------------------------      ----------------------------         PCT < 0.25 ng/mL                PCT < 0.10 ng/mL          Strongly encourage             Strongly discourage   discontinuation of antibiotics    initiation of antibiotics    ----------------------------     -----------------------------       PCT 0.25 - 0.50 ng/mL            PCT 0.10 -  0.25 ng/mL               OR       >80% decrease in PCT            Discourage initiation of                                            antibiotics      Encourage discontinuation           of antibiotics    ----------------------------     -----------------------------         PCT >= 0.50 ng/mL              PCT 0.26 - 0.50 ng/mL               AND        <80% decrease in PCT             Encourage initiation of                                             antibiotics       Encourage continuation           of antibiotics    ----------------------------     -----------------------------        PCT >= 0.50 ng/mL                  PCT > 0.50 ng/mL               AND         increase in PCT                  Strongly encourage                                      initiation of antibiotics    Strongly encourage escalation           of antibiotics                                     -----------------------------                                           PCT <= 0.25 ng/mL                                                 OR                                        > 80% decrease in PCT  Discontinue / Do not initiate                                             antibiotics  Performed at [facility], [address] [place], [place] [address]   Comprehensive metabolic panel     Status: Abnormal   Collection Time: 01/03/24  5:19 AM  Result Value Ref Range   Sodium 137 135 - 145 mmol/L   Potassium 3.1 (L) 3.5 - 5.1 mmol/L   Chloride 102 98 - 111 mmol/L   CO2 25 22 - 32 mmol/L   Glucose, Bld 114 (H) 70 - 99 mg/dL    Comment: Glucose  reference range applies only to samples taken after fasting for at least 8 hours.   BUN 12 8 - 23 mg/dL   Creatinine, Ser 6.04 0.61 - 1.24 mg/dL   Calcium  8.7 (L) 8.9 - 10.3 mg/dL   Total Protein 6.2 (L) 6.5 - 8.1 g/dL   Albumin 2.6 (L) 3.5 - 5.0 g/dL   AST 38 15 - 41 U/L   ALT 39 0 - 44 U/L   Alkaline Phosphatase 62 38 - 126 U/L   Total Bilirubin 1.5 (H) 0.0 - 1.2 mg/dL   GFR, Estimated >54 >09 mL/min    Comment: (NOTE) Calculated using the CKD-EPI Creatinine Equation (2021)    Anion gap 10 5 - 15    Comment: Performed at [facility], [address], [place], [place] [address]  CBC     Status: Abnormal   Collection Time: 01/03/24  5:19 AM  Result Value Ref Range   WBC 11.2 (H) 4.0 - 10.5 K/uL   RBC 4.87 4.22 - 5.81 MIL/uL   Hemoglobin 15.2 13.0 - 17.0 g/dL   HCT 47.8 29.5 - 62.1 %   MCV 91.8 80.0 - 100.0 fL   MCH 31.2 26.0 - 34.0 pg   MCHC 34.0 30.0 - 36.0 g/dL   RDW 30.8 65.7 - 84.6 %   Platelets 283 150 - 400 K/uL   nRBC 0.0 0.0 - 0.2 %    Comment: Performed at [facility], [address], [place], [place] [address]   CT ABDOMEN PELVIS W CONTRAST Result Date: 01/02/2024 CLINICAL DATA:  Nonlocalized abdomen pain EXAM: CT ABDOMEN AND PELVIS WITH CONTRAST TECHNIQUE: Multidetector CT imaging of the abdomen and pelvis was performed using the standard protocol following bolus administration of intravenous contrast. RADIATION DOSE REDUCTION: This exam was performed according to the departmental dose-optimization program which includes automated exposure control, adjustment of the mA and/or kV according to patient size and/or use of iterative reconstruction technique. CONTRAST:  75mL OMNIPAQUE  IOHEXOL  350 MG/ML SOLN COMPARISON:  None Available. FINDINGS: Lower chest: Lung bases demonstrate mild dependent atelectasis. No consolidation or effusion. Coronary vascular calcification. Hepatobiliary: No calcified gallstone or biliary dilatation Pancreas: Unremarkable. No pancreatic  ductal dilatation or surrounding inflammatory changes. Spleen: Normal in size without focal abnormality. Adrenals/Urinary Tract: Adrenal glands are normal. Kidneys show no hydronephrosis. Subcentimeter hypodensities for which no imaging follow-up is recommended. Bladder is normal Stomach/Bowel: Stomach is within normal limits. Appendix appears normal. No evidence of bowel wall thickening, distention, or inflammatory changes. Diverticular disease of the colon. Vascular/Lymphatic: Severe aortic atherosclerosis with moderate mural thickening at the distal infrarenal abdominal aorta and iliac bifurcation. No aneurysm. No suspicious lymph nodes Reproductive: Enlarged prostate with probable  TURP defect Other: Negative for pelvic effusion or free air Musculoskeletal: No acute or suspicious osseous abnormality IMPRESSION: 1. No CT evidence for acute intra-abdominal or pelvic abnormality. 2. Diverticular disease of the colon without acute inflammatory process. 3. Aortic atherosclerosis. Aortic Atherosclerosis (ICD10-I70.0). Electronically Signed   By: Esmeralda Hedge M.D.   On: 01/02/2024 17:02   DG Chest 1 View Result Date: 01/02/2024 CLINICAL DATA:  Cough EXAM: CHEST  1 VIEW COMPARISON:  X-ray 08/03/2018 FINDINGS: Elevation of the left hemidiaphragm. Mild basilar atelectasis bilaterally. No pneumothorax, edema or consolidation. Normal cardiopericardial silhouette with a calcified aorta. Sternal wires. IMPRESSION: Postop chest.  Elevated left hemidiaphragm.  Basilar atelectasis. Electronically Signed   By: Adrianna Horde M.D.   On: 01/02/2024 12:51    Pending Labs Unresulted Labs (From admission, onward)     Start     Ordered   01/09/24 0500  Creatinine, serum  (enoxaparin  (LOVENOX )    CrCl >/= 30 ml/min)  Weekly,   R     Comments: while on enoxaparin  therapy    01/02/24 2011   01/04/24 0500  Magnesium   Tomorrow morning,   R        01/03/24 1059   01/04/24 0500  Phosphorus  Tomorrow morning,   R        01/03/24  1059   01/03/24 0500  Comprehensive metabolic panel  Daily,   R      01/02/24 2011   01/03/24 0500  CBC  Daily,   R      01/02/24 2011   01/02/24 2011  Rheumatoid factor  Once,   R        01/02/24 2011   01/02/24 2011  CYCLIC CITRUL PEPTIDE ANTIBODY, IGG/IGA  Once,   R        01/02/24 2011            Vitals/Pain Today's Vitals   01/03/24 0529 01/03/24 0715 01/03/24 0945 01/03/24 1201  BP:  139/77  124/68  Pulse:  93  (!) 104  Resp:  17  (!) 24  Temp: 98.3 F (36.8 C)  98.6 F (37 C) 98.7 F (37.1 C)  TempSrc: Oral  Oral   SpO2:  94%  91%  PainSc:        Isolation Precautions No active isolations  Medications Medications  enoxaparin  (LOVENOX ) injection 40 mg (40 mg Subcutaneous Not Given 01/02/24 2118)  sodium chloride  flush (NS) 0.9 % injection 3 mL (3 mLs Intravenous Given 01/03/24 0913)  acetaminophen  (TYLENOL ) tablet 650 mg (650 mg Oral Given 01/02/24 2140)    Or  acetaminophen  (TYLENOL ) suppository 650 mg ( Rectal See Alternative 01/02/24 2140)  oxyCODONE  (Oxy IR/ROXICODONE ) immediate release tablet 5 mg (has no administration in time range)  fentaNYL  (SUBLIMAZE ) injection 12.5-50 mcg (has no administration in time range)  senna-docusate (Senokot-S) tablet 1 tablet (has no administration in time range)  ondansetron  (ZOFRAN ) tablet 4 mg (has no administration in time range)    Or  ondansetron  (ZOFRAN ) injection 4 mg (has no administration in time range)  0.9 %  sodium chloride  infusion ( Intravenous Infusion Verify 01/03/24 0705)  isosorbide  mononitrate (IMDUR ) 24 hr tablet 60 mg (60 mg Oral Given 01/03/24 0910)  clopidogrel  (PLAVIX ) tablet 75 mg (75 mg Oral Given 01/03/24 0910)  lactated ringers  bolus 1,000 mL (0 mLs Intravenous Stopped 01/02/24 1706)  cefTRIAXone  (ROCEPHIN ) 1 g in sodium chloride  0.9 % 100 mL IVPB (0 g Intravenous Stopped 01/02/24 1817)  fentaNYL  (SUBLIMAZE ) injection 50 mcg (50  mcg Intravenous Given 01/02/24 1706)  sodium chloride  0.9 % bolus 1,000 mL  (0 mLs Intravenous Stopped 01/02/24 1911)  iohexol  (OMNIPAQUE ) 350 MG/ML injection 75 mL (75 mLs Intravenous Contrast Given 01/02/24 1640)  0.9 %  sodium chloride  infusion (0 mLs Intravenous Stopped 01/02/24 2109)  potassium chloride  (KLOR-CON ) packet 40 mEq (40 mEq Oral Given 01/03/24 0910)    Mobility walks     Focused Assessments Pulmonary Assessment Handoff:  Lung sounds:   O2 Device: Nasal Cannula O2 Flow Rate (L/min): 2 L/min    R Recommendations: See Admitting Provider Note  Report given to:   Additional Notes: Pt is AO, able to feed himself, use a urinal, seems sad

## 2024-01-03 NOTE — ED Notes (Signed)
 Patient left the floor in stable condition, with his belongings and staff.

## 2024-01-03 NOTE — Plan of Care (Signed)
   Problem: Education: Goal: Knowledge of General Education information will improve Description: Including pain rating scale, medication(s)/side effects and non-pharmacologic comfort measures Outcome: Completed/Met

## 2024-01-04 DIAGNOSIS — R6510 Systemic inflammatory response syndrome (SIRS) of non-infectious origin without acute organ dysfunction: Secondary | ICD-10-CM | POA: Diagnosis not present

## 2024-01-04 LAB — CBC
HCT: 41.9 % (ref 39.0–52.0)
Hemoglobin: 14 g/dL (ref 13.0–17.0)
MCH: 30.7 pg (ref 26.0–34.0)
MCHC: 33.4 g/dL (ref 30.0–36.0)
MCV: 91.9 fL (ref 80.0–100.0)
Platelets: 287 10*3/uL (ref 150–400)
RBC: 4.56 MIL/uL (ref 4.22–5.81)
RDW: 13.3 % (ref 11.5–15.5)
WBC: 12.4 10*3/uL — ABNORMAL HIGH (ref 4.0–10.5)
nRBC: 0 % (ref 0.0–0.2)

## 2024-01-04 LAB — COMPREHENSIVE METABOLIC PANEL
ALT: 41 U/L (ref 0–44)
AST: 51 U/L — ABNORMAL HIGH (ref 15–41)
Albumin: 2.3 g/dL — ABNORMAL LOW (ref 3.5–5.0)
Alkaline Phosphatase: 55 U/L (ref 38–126)
Anion gap: 9 (ref 5–15)
BUN: 13 mg/dL (ref 8–23)
CO2: 25 mmol/L (ref 22–32)
Calcium: 8.4 mg/dL — ABNORMAL LOW (ref 8.9–10.3)
Chloride: 101 mmol/L (ref 98–111)
Creatinine, Ser: 0.98 mg/dL (ref 0.61–1.24)
GFR, Estimated: >60 mL/min (ref >60)
Glucose, Bld: 108 mg/dL — ABNORMAL HIGH (ref 70–99)
Potassium: 3.3 mmol/L — ABNORMAL LOW (ref 3.5–5.1)
Sodium: 135 mmol/L (ref 135–145)
Total Bilirubin: 1.4 mg/dL — ABNORMAL HIGH (ref 0.0–1.2)
Total Protein: 6 g/dL — ABNORMAL LOW (ref 6.5–8.1)

## 2024-01-04 LAB — CYCLIC CITRUL PEPTIDE ANTIBODY, IGG/IGA: CCP Antibodies IgG/IgA: 3 U (ref 0–19)

## 2024-01-04 LAB — MAGNESIUM: Magnesium: 2.1 mg/dL (ref 1.7–2.4)

## 2024-01-04 LAB — PHOSPHORUS: Phosphorus: 2.6 mg/dL (ref 2.5–4.6)

## 2024-01-04 MED ORDER — ORAL CARE MOUTH RINSE: 15.0000 mL | OROMUCOSAL | Status: DC | PRN

## 2024-01-04 MED ORDER — POTASSIUM CHLORIDE 20 MEQ PO PACK
40.0000 meq | PACK | Freq: Once | ORAL | Status: AC
Start: 2024-01-04 — End: 2024-01-04
  Administered 2024-01-04: 40 meq via ORAL
  Filled 2024-01-04: qty 2

## 2024-01-04 NOTE — Plan of Care (Signed)
  Problem: Health Behavior/Discharge Planning: Goal: Ability to manage health-related needs will improve Outcome: Progressing   

## 2024-01-04 NOTE — Progress Notes (Signed)
Mobility Specialist Progress Note:    01/04/24 1000  Mobility  Activity Ambulated with assistance in room;Transferred from bed to chair  Level of Assistance Minimal assist, patient does 75% or more  Assistive Device Front wheel walker  Distance Ambulated (ft) 40 ft  Activity Response Tolerated well  Mobility Referral Yes  Mobility visit 1 Mobility  Mobility Specialist Start Time (ACUTE ONLY) 0847  Mobility Specialist Stop Time (ACUTE ONLY) 0904  Mobility Specialist Time Calculation (min) (ACUTE ONLY) 17 min   Pt received in bed and agreeable w/ encouragement. C/o fatigue and lack of sleep last night. Required minA to come EOB and stand. Ambulated to sink w/ CG and took x1 seated rest break. Then able to ambulate around bed to chair. No complaints throughout. Pt left in chair with call bell and all needs met.  D'Vante Earlene Plater Mobility Specialist Please contact via Special educational needs teacher or Rehab office at [phone]

## 2024-01-04 NOTE — TOC Progression Note (Addendum)
Transition of Care [facility]) - Progression Note    Patient Details  Name: Robert [name] MRN: [id number] Date of Birth: December 07, 1941  Transition of Care [facility]) CM/SW Contact  Lockie Pares, RN Phone Number: 01/04/2024, 12:35 PM  Clinical Narrative:     Received word from CSW that patient had refused Snf and wants to go home with home health. His son takes care of him at home. Called son left confidential voice message to call this RNCM back regarding discharge planning.  1300 Met with patients son . Discussed  SNF versus HH with him He will speak to his dad about it, told him the recommendations were SNF.Marland Kitchen He will call this RNCM back about what the decide. 1640 Spoke with Greig Castilla , patients son about options. He understands his father wants to come home with home health, but will want to make sure he has the right care. Want to follow up with [facility] tomorrow and discuss with dad again. Went over the differences between SNF and [facility].  Expected Discharge Plan: Home w Home Health Services Barriers to Discharge: Continued Medical Work up  Expected Discharge Plan and Services                        Home with home health and family                       Social Determinants of Health (SDOH) Interventions SDOH Screenings   Food Insecurity: No Food Insecurity (01/03/2024)  Housing: Low Risk  (01/03/2024)  Transportation Needs: No Transportation Needs (01/03/2024)  Utilities: Not At Risk (01/03/2024)  Social Connections: Moderately Isolated (01/03/2024)  Tobacco Use: Low Risk  (01/02/2024)    Readmission Risk Interventions     No data to display

## 2024-01-04 NOTE — Progress Notes (Signed)
PROGRESS NOTE    Asan Stlouis  YQM:[id number] DOB: 1941-10-05 DOA: 01/02/2024 PCP: Georgann Housekeeper, MD   Brief Narrative: This 83 yrs old Male with PMH significant for hypertension, hyperlipidemia, right subclavian artery stenosis status post stenting in 2016, CAD with CABG in 2008 who presents with generalized aches and malaise.  Patient has developed right shoulder pain a week ago and then it spread to involve the neck , left shoulder , hip and lower extremities.  Labs notable for WBC 14.7, Hb 17.2, lactic acid 2.5, normal CK, UA no bacteriuria or pyuria.  Chest x-ray no acute findings, CT A&P unremarkable.  Patient was given a dose of IV Rocephin,  started on IV fluids and admitted for further evaluation with a concern for polymyalgia rheumatica.  Assessment & Plan:   Principal Problem:   SIRS (systemic inflammatory response syndrome) (HCC) Active Problems:   CAD (coronary artery disease)   Essential (primary) hypertension   Polymyalgia rheumatica (HCC)  SIRS: Generalized ache / Malaise: Patient presented with generalized body ache, malaise, more severe in the neck and shoulders but also involving hip and lower extremities.   He was found to have multiple SIRS criteria concerning for possible infection.  So far no infection has been found on broad ED workup.   Serum CK level is normal. RSV, COVID, flu negative, blood cultures no growth so far. CRP mildly elevated, procalcitonin normal, Check ESR, RF, anti-CCP, TSH, blood cultures no growth so far Follow-up clinical course.  Consider steroids if cultures negative and no infectious process identified. Needs PT and OT evaluation.  CAD: Patient denies any chest pain, SOB, dizziness. Continue Plavix and Imdur.  Essential hypertension: Hold blood pressure medications,  his blood pressure is on the soft side.  Hypokalemia: Replaced.  Continue to monitor.  DVT prophylaxis: Lovenox Code Status: Full code Family Communication: No  family at bedside Disposition Plan:    Status is: Inpatient Remains inpatient appropriate because:     Admitted for generalized malaise, generalized body ache concerning for polymyalgia rheumatica workup in progress.  Needs PT and OT evaluation   Consultants:  None  Procedures: CT A/P  Antimicrobials: Anti-infectives (From admission, onward)    Start     Dose/Rate Route Frequency Ordered Stop   01/02/24 1515  cefTRIAXone (ROCEPHIN) 1 g in sodium chloride 0.9 % 100 mL IVPB        1 g 200 mL/hr over 30 Minutes Intravenous  Once 01/02/24 1509 01/02/24 1817      Subjective: Patient was seen and examined at bedside. Overnight events noted. Patient reports feeling better,  still reports pain in the hips and shoulders.  Denies any fall or injury.  Objective: Vitals:   01/03/24 2144 01/04/24 0609 01/04/24 0637 01/04/24 0933  BP: 130/69 110/68  112/76  Pulse: (!) 102 (!) 113 98 (!) 55  Resp: 18 18  18   Temp: 100.3 F (37.9 C) 98.2 F (36.8 C)    TempSrc: Oral     SpO2: 90% 94%  92%  Weight:        Intake/Output Summary (Last 24 hours) at 01/04/2024 1354 Last data filed at 01/04/2024 1100 Gross per 24 hour  Intake 490 ml  Output 1750 ml  Net -1260 ml   Filed Weights   01/03/24 1414  Weight: 74.8 kg    Examination:  General exam: Appears comfortable, deconditioned, not in any acute distress. Respiratory system: CTA bilaterally . Respiratory effort normal.  RR 15 Cardiovascular system: S1 & S2 heard, RRR.  No JVD, murmurs, rubs, gallops or clicks. Gastrointestinal system: Abdomen is non distended, soft and non tender.  Normal bowel sounds heard. Central nervous system: Alert and oriented X 3. No focal neurological deficits. Extremities: No edema, no cyanosis, no clubbing Skin: No rashes, lesions or ulcers Psychiatry: Judgement and insight appear normal. Mood & affect appropriate.     Data Reviewed: I have personally reviewed following labs and imaging  studies  CBC: Recent Labs  Lab 01/02/24 1319 01/03/24 0519 01/04/24 0314  WBC 14.7* 11.2* 12.4*  NEUTROABS 10.4*  --   --   HGB 17.2* 15.2 14.0  HCT 50.4 44.7 41.9  MCV 91.3 91.8 91.9  PLT 351 283 287   Basic Metabolic Panel: Recent Labs  Lab 01/02/24 1319 01/03/24 0519 01/04/24 0314  NA 133* 137 135  K 3.6 3.1* 3.3*  CL 95* 102 101  CO2 26 25 25   GLUCOSE 149* 114* 108*  BUN 21 12 13   CREATININE 1.11 0.84 0.98  CALCIUM 9.4 8.7* 8.4*  MG  --   --  2.1  PHOS  --   --  2.6   GFR: CrCl cannot be calculated (Unknown ideal weight.). Liver Function Tests: Recent Labs  Lab 01/02/24 1319 01/03/24 0519 01/04/24 0314  AST 46* 38 51*  ALT 52* 39 41  ALKPHOS 76 62 55  BILITOT 1.6* 1.5* 1.4*  PROT 7.8 6.2* 6.0*  ALBUMIN 3.3* 2.6* 2.3*   No results for input(s): "LIPASE", "AMYLASE" in the last 168 hours. No results for input(s): "AMMONIA" in the last 168 hours. Coagulation Profile: No results for input(s): "INR", "PROTIME" in the last 168 hours. Cardiac Enzymes: Recent Labs  Lab 01/02/24 1319  CKTOTAL 192   BNP (last 3 results) No results for input(s): "PROBNP" in the last 8760 hours. HbA1C: No results for input(s): "HGBA1C" in the last 72 hours. CBG: No results for input(s): "GLUCAP" in the last 168 hours. Lipid Profile: No results for input(s): "CHOL", "HDL", "LDLCALC", "TRIG", "CHOLHDL", "LDLDIRECT" in the last 72 hours. Thyroid Function Tests: Recent Labs    01/02/24 2120  TSH 2.479   Anemia Panel: No results for input(s): "VITAMINB12", "FOLATE", "FERRITIN", "TIBC", "IRON", "RETICCTPCT" in the last 72 hours. Sepsis Labs: Recent Labs  Lab 01/02/24 1338 01/02/24 1720 01/02/24 2120  PROCALCITON  --   --  0.10  LATICACIDVEN 2.5* 2.7* 1.3    Recent Results (from the past 240 hours)  Resp panel by RT-PCR (RSV, Flu A&B, Covid) Anterior Nasal Swab     Status: None   Collection Time: 01/02/24 11:46 AM   Specimen: Anterior Nasal Swab  Result Value Ref  Range Status   SARS Coronavirus 2 by RT PCR NEGATIVE NEGATIVE Final   Influenza A by PCR NEGATIVE NEGATIVE Final   Influenza B by PCR NEGATIVE NEGATIVE Final    Comment: (NOTE) The Xpert Xpress SARS-CoV-2/FLU/RSV plus assay is intended as an aid in the diagnosis of influenza from Nasopharyngeal swab specimens and should not be used as a sole basis for treatment. Nasal washings and aspirates are unacceptable for Xpert Xpress SARS-CoV-2/FLU/RSV testing.  Fact Sheet for Patients: BloggerCourse.com  Fact Sheet for Healthcare Providers: SeriousBroker.it  This test is not yet approved or cleared by the [place] FDA and has been authorized for detection and/or diagnosis of SARS-CoV-2 by FDA under an Emergency Use Authorization (EUA). This EUA will remain in effect (meaning this test can be used) for the duration of the COVID-19 declaration under Section 564(b)(1) of the Act, 21  U.S.C. section 360bbb-3(b)(1), unless the authorization is terminated or revoked.     Resp Syncytial Virus by PCR NEGATIVE NEGATIVE Final    Comment: (NOTE) Fact Sheet for Patients: BloggerCourse.com  Fact Sheet for Healthcare Providers: SeriousBroker.it  This test is not yet approved or cleared by the [place] FDA and has been authorized for detection and/or diagnosis of SARS-CoV-2 by FDA under an Emergency Use Authorization (EUA). This EUA will remain in effect (meaning this test can be used) for the duration of the COVID-19 declaration under Section 564(b)(1) of the Act, 21 U.S.C. section 360bbb-3(b)(1), unless the authorization is terminated or revoked.  Performed at [facility], [address] [place], [place] [address]   Blood culture (routine x 2)     Status: None (Preliminary result)   Collection Time: 01/02/24 12:03 PM   Specimen: BLOOD  Result Value Ref Range Status   Specimen  Description BLOOD LEFT ANTECUBITAL  Final   Special Requests   Final    BOTTLES DRAWN AEROBIC AND ANAEROBIC Blood Culture adequate volume   Culture   Final    NO GROWTH 2 DAYS Performed at [facility], [address], [place], [place] [address]    Report Status PENDING  Incomplete  Blood culture (routine x 2)     Status: None (Preliminary result)   Collection Time: 01/02/24  1:33 PM   Specimen: BLOOD  Result Value Ref Range Status   Specimen Description BLOOD RIGHT ANTECUBITAL  Final   Special Requests   Final    BOTTLES DRAWN AEROBIC AND ANAEROBIC Blood Culture results may not be optimal due to an inadequate volume of blood received in culture bottles   Culture   Final    NO GROWTH 2 DAYS Performed at [facility], [address], [place], [place] [address]    Report Status PENDING  Incomplete    Radiology Studies: CT ABDOMEN PELVIS W CONTRAST Result Date: 01/02/2024 CLINICAL DATA:  Nonlocalized abdomen pain EXAM: CT ABDOMEN AND PELVIS WITH CONTRAST TECHNIQUE: Multidetector CT imaging of the abdomen and pelvis was performed using the standard protocol following bolus administration of intravenous contrast. RADIATION DOSE REDUCTION: This exam was performed according to the departmental dose-optimization program which includes automated exposure control, adjustment of the mA and/or kV according to patient size and/or use of iterative reconstruction technique. CONTRAST:  75mL OMNIPAQUE IOHEXOL 350 MG/ML SOLN COMPARISON:  None Available. FINDINGS: Lower chest: Lung bases demonstrate mild dependent atelectasis. No consolidation or effusion. Coronary vascular calcification. Hepatobiliary: No calcified gallstone or biliary dilatation Pancreas: Unremarkable. No pancreatic ductal dilatation or surrounding inflammatory changes. Spleen: Normal in size without focal abnormality. Adrenals/Urinary Tract: Adrenal glands are normal. Kidneys show no hydronephrosis. Subcentimeter hypodensities for  which no imaging follow-up is recommended. Bladder is normal Stomach/Bowel: Stomach is within normal limits. Appendix appears normal. No evidence of bowel wall thickening, distention, or inflammatory changes. Diverticular disease of the colon. Vascular/Lymphatic: Severe aortic atherosclerosis with moderate mural thickening at the distal infrarenal abdominal aorta and iliac bifurcation. No aneurysm. No suspicious lymph nodes Reproductive: Enlarged prostate with probable TURP defect Other: Negative for pelvic effusion or free air Musculoskeletal: No acute or suspicious osseous abnormality IMPRESSION: 1. No CT evidence for acute intra-abdominal or pelvic abnormality. 2. Diverticular disease of the colon without acute inflammatory process. 3. Aortic atherosclerosis. Aortic Atherosclerosis (ICD10-I70.0). Electronically Signed   By: Jasmine Pang M.D.   On: 01/02/2024 17:02   Scheduled Meds:  clopidogrel  75 mg Oral  Daily   enoxaparin (LOVENOX) injection  40 mg Subcutaneous Q24H   isosorbide mononitrate  60 mg Oral Daily   sodium chloride flush  3 mL Intravenous Q12H   Continuous Infusions:   LOS: 1 day    Time spent: 35 mins    Willeen Niece, MD [facility]   If 7PM-7AM, please contact night-coverage

## 2024-01-04 NOTE — TOC Initial Note (Signed)
Transition of Care [facility]) - Initial/Assessment Note    Patient Details  Name: Robert [name] MRN: [id number] Date of Birth: 01-Aug-1941  [facility] CM/SW Contact:    Erin Sons, LCSW Phone Number: 01/04/2024, 12:24 PM  Clinical Narrative:                  CSW met with pt to discuss SNF recommendation. Pt lives at home with his son. He states that he does not want to go to SNF. His son takes care of him and does not work. Pt is aware that [facility] would involve PT/OT and that PCS type care would have to be provided by family. Pt states he has a walker and a wheelchair at home. He does not have a 3in1. RNCM notified.   Expected Discharge Plan: Home w Home Health Services Barriers to Discharge: Continued Medical Work up   Patient Goals and CMS Choice      To return home            Prior Living Arrangements/Services   Lives with:: Adult Children Patient language and need for interpreter reviewed:: Yes        Need for Family Participation in Patient Care: Yes (Comment) Care giver support system in place?: Yes (comment)   Criminal Activity/Legal Involvement Pertinent to Current Situation/Hospitalization: No - Comment as needed  Activities of Daily Living   ADL Screening (condition at time of admission) Independently performs ADLs?: Yes (appropriate for developmental age) Is the patient deaf or have difficulty hearing?: No Does the patient have difficulty seeing, even when wearing glasses/contacts?: No Does the patient have difficulty concentrating, remembering, or making decisions?: No  Permission Sought/Granted   Permission granted to share information with : Yes, Verbal Permission Granted  Share Information with NAME: Son, [name]           Emotional Assessment Appearance:: Appears stated age Attitude/Demeanor/Rapport: Lethargic Affect (typically observed): Appropriate Orientation: : Oriented to Self, Oriented to Place, Oriented to Situation Alcohol /  Substance Use: Not Applicable Psych Involvement: No (comment)  Admission diagnosis:  Polymyalgia rheumatica (HCC) [M35.3] SIRS (systemic inflammatory response syndrome) (HCC) [R65.10] Patient Active Problem List   Diagnosis Date Noted   Polymyalgia rheumatica (HCC) 01/03/2024   SIRS (systemic inflammatory response syndrome) (HCC) 01/02/2024   Palpitations    Unstable angina (HCC)    Chest pain 07/09/2018   CAD (coronary artery disease) 05/25/2018   Essential (primary) hypertension 05/25/2018   Hyperlipidemia LDL goal <70 05/25/2018   PCP:  Georgann Housekeeper, MD Pharmacy:   [id number] - [place], [place] - [address] AT [address] [facility] [address] [place] [address] Phone: [phone] Fax: [phone]  [facility] [place] - [address] [place] [address] Phone: [phone] Fax: [phone]     Social Drivers of Health (SDOH) Social History: SDOH Screenings   Food Insecurity: No Food Insecurity (01/03/2024)  Housing: Low Risk  (01/03/2024)  Transportation Needs: No Transportation Needs (01/03/2024)  Utilities: Not At Risk (01/03/2024)  Social Connections: Moderately Isolated (01/03/2024)  Tobacco Use: Low Risk  (01/02/2024)   SDOH Interventions:     Readmission Risk Interventions     No data to display

## 2024-01-05 DIAGNOSIS — R6510 Systemic inflammatory response syndrome (SIRS) of non-infectious origin without acute organ dysfunction: Secondary | ICD-10-CM | POA: Diagnosis not present

## 2024-01-05 LAB — CBC
HCT: 42.4 % (ref 39.0–52.0)
Hemoglobin: 14.2 g/dL (ref 13.0–17.0)
MCH: 30.9 pg (ref 26.0–34.0)
MCHC: 33.5 g/dL (ref 30.0–36.0)
MCV: 92.2 fL (ref 80.0–100.0)
Platelets: 333 10*3/uL (ref 150–400)
RBC: 4.6 MIL/uL (ref 4.22–5.81)
RDW: 13.4 % (ref 11.5–15.5)
WBC: 11.8 10*3/uL — ABNORMAL HIGH (ref 4.0–10.5)
nRBC: 0 % (ref 0.0–0.2)

## 2024-01-05 LAB — COMPREHENSIVE METABOLIC PANEL
ALT: 62 U/L — ABNORMAL HIGH (ref 0–44)
AST: 70 U/L — ABNORMAL HIGH (ref 15–41)
Albumin: 2.3 g/dL — ABNORMAL LOW (ref 3.5–5.0)
Alkaline Phosphatase: 71 U/L (ref 38–126)
Anion gap: 9 (ref 5–15)
BUN: 24 mg/dL — ABNORMAL HIGH (ref 8–23)
CO2: 25 mmol/L (ref 22–32)
Calcium: 8.6 mg/dL — ABNORMAL LOW (ref 8.9–10.3)
Chloride: 102 mmol/L (ref 98–111)
Creatinine, Ser: 2.34 mg/dL — ABNORMAL HIGH (ref 0.61–1.24)
GFR, Estimated: 27 mL/min — ABNORMAL LOW (ref >60)
Glucose, Bld: 112 mg/dL — ABNORMAL HIGH (ref 70–99)
Potassium: 3.6 mmol/L (ref 3.5–5.1)
Sodium: 136 mmol/L (ref 135–145)
Total Bilirubin: 1 mg/dL (ref 0.0–1.2)
Total Protein: 6.3 g/dL — ABNORMAL LOW (ref 6.5–8.1)

## 2024-01-05 LAB — RHEUMATOID FACTOR: Rheumatoid fact SerPl-aCnc: 21.2 [IU]/mL — ABNORMAL HIGH (ref <14.0)

## 2024-01-05 MED ORDER — METOPROLOL SUCCINATE ER 50 MG PO TB24
75.0000 mg | ORAL_TABLET | Freq: Every day | ORAL | Status: DC
  Administered 2024-01-05 – 2024-01-09 (×5): 75 mg via ORAL
  Filled 2024-01-05 (×5): qty 1

## 2024-01-05 MED ORDER — SODIUM CHLORIDE 0.9 % IV SOLN: INTRAVENOUS | Status: DC

## 2024-01-05 MED ORDER — ENOXAPARIN SODIUM 30 MG/0.3ML IJ SOSY
30.0000 mg | PREFILLED_SYRINGE | INTRAMUSCULAR | Status: DC
  Administered 2024-01-05 – 2024-01-06 (×2): 30 mg via SUBCUTANEOUS
  Filled 2024-01-05 (×2): qty 0.3

## 2024-01-05 NOTE — Plan of Care (Signed)

## 2024-01-05 NOTE — Progress Notes (Signed)
Mobility Specialist Progress Note:    01/05/24 1000  Mobility  Activity Ambulated with assistance in room;Transferred from bed to chair  Level of Assistance Minimal assist, patient does 75% or more  Assistive Device Front wheel walker  Distance Ambulated (ft) 40 ft  Activity Response Tolerated well  Mobility Referral Yes  Mobility visit 1 Mobility  Mobility Specialist Start Time (ACUTE ONLY) [id number]  Mobility Specialist Stop Time (ACUTE ONLY) 0848  Mobility Specialist Time Calculation (min) (ACUTE ONLY) 16 min   Pt received in bed and agreeable. C/o R knee pain. Able to ambulate around bed to chair w/ minA. Asymptomatic throughout. Pt left in chair with call bell and all needs met. RN aware.  D'Vante Earlene Plater Mobility Specialist Please contact via Special educational needs teacher or Rehab office at [phone]

## 2024-01-05 NOTE — Progress Notes (Signed)
HR noted to be 130's on telemetry, pt. Aa+ox4, asymptomatic, HR varies from 90's to 140's back to 90's within seconds, regular rhythm, T 99.3, BP 111/77, MD made aware, awaiting orders  Margarita Grizzle

## 2024-01-05 NOTE — TOC Progression Note (Addendum)
Transition of Care [facility]) - Progression Note    Patient Details  Name: Robert [name] MRN: [id number] Date of Birth: 01-23-1941  Transition of [facility]) CM/SW Contact  Lockie Pares, RN Phone Number: 01/05/2024, 11:24 AM  Clinical Narrative:     Spoke to son  Greig Castilla regarding Home health options and choices. Emailed Medicare.gov list per request. Plan is [facility].  [address] Son Greig Castilla called back and they are going to go with [facility]. Called Becky Sax and she accepted for PT OT RN and social work. Messaged with MD to place F2F orders.  Expected Discharge Plan: Home w Home Health Services Barriers to Discharge: Continued Medical Work up  Expected Discharge Plan and Services        Home with [facility]                                       Social Determinants of Health (SDOH) Interventions SDOH Screenings   Food Insecurity: No Food Insecurity (01/03/2024)  Housing: Low Risk  (01/03/2024)  Transportation Needs: No Transportation Needs (01/03/2024)  Utilities: Not At Risk (01/03/2024)  Social Connections: Moderately Isolated (01/03/2024)  Tobacco Use: Low Risk  (01/02/2024)    Readmission Risk Interventions     No data to display

## 2024-01-05 NOTE — Progress Notes (Signed)
Physical Therapy Treatment Patient Details Name: Robert [name] MRN: [id number] DOB: Jul 13, 1941 Today's Date: 01/05/2024   History of Present Illness 83 y.o. male presents to [facility] hospital on 01/02/2024 aches and malaise. Admitted for management of SIRS. Respiratory panel was negative. PMH includes HTN, HLD, CABG.    PT Comments  The pt was agreeable to session with focus on progressing ambulation distance. He was able to complete multiple short bouts of ambulation in the room, but is dependent on UE support and minA. He had no overt buckling or LOB but reports R knee pain (soreness) and had slightly antalgic gait due to this. Pt reports continued fatigue, educated on importance of continued challenge with exercise and mobility to regain strength and endurance. Continue to recommend follow up therapies to progress strength and endurance and inpatient therapies after discharge to return to greatest level of independence.    If plan is discharge home, recommend the following: A little help with walking and/or transfers;A lot of help with bathing/dressing/bathroom;Assistance with cooking/housework;Assist for transportation   Can travel by private vehicle     Yes  Equipment Recommendations  Rolling walker (2 wheels);BSC/3in1    Recommendations for Other Services       Precautions / Restrictions Precautions Precautions: Fall Precaution Comments: poor endurance Restrictions Weight Bearing Restrictions Per Provider Order: No     Mobility  Bed Mobility Overal bed mobility: Needs Assistance             General bed mobility comments: pt OOB at start and end of session    Transfers Overall transfer level: Needs assistance Equipment used: Rolling walker (2 wheels) Transfers: Sit to/from Stand Sit to Stand: Min assist, Mod assist           General transfer comment: minA with posterior lean initially, modA from low chair without armrests, minA from [facility] with cues for hand placement.  poor eccentric control    Ambulation/Gait Ambulation/Gait assistance: Min assist Gait Distance (Feet): 15 Feet (+ 18 ft + 15 ft) Assistive device: Rolling walker (2 wheels) Gait Pattern/deviations: Step-through pattern, Decreased stride length, Trunk flexed Gait velocity: reduced Gait velocity interpretation: <1.31 ft/sec, indicative of household ambulator   General Gait Details: slowed gait, trunk flexed, poor tolerance due to R knee pain      Balance Overall balance assessment: Needs assistance Sitting-balance support: No upper extremity supported, Feet supported Sitting balance-Leahy Scale: Good     Standing balance support: Bilateral upper extremity supported, Reliant on assistive device for balance Standing balance-Leahy Scale: Poor Standing balance comment: dependent on BUE support and minA                            Cognition Arousal: Alert Behavior During Therapy: WFL for tasks assessed/performed Overall Cognitive Status: Within Functional Limits for tasks assessed                                 General Comments: no family present to [employer]        Exercises General Exercises - Lower Extremity Long Arc Quad: AROM, Both, 5 reps Heel Raises: AROM, Both, 15 reps    General Comments General comments (skin integrity, edema, etc.): VSS on RA      Pertinent Vitals/Pain Pain Assessment Pain Assessment: Faces Faces Pain Scale: Hurts even more Pain Location: R knee and low back Pain Descriptors / Indicators: Aching, Discomfort, Grimacing, Sore Pain  Intervention(s): Limited activity within patient's tolerance, Monitored during session, Repositioned     PT Goals (current goals can now be found in the care plan section) Acute Rehab PT Goals Patient Stated Goal: to return to independence PT Goal Formulation: With patient Time For Goal Achievement: 01/17/24 Potential to Achieve Goals: Good Progress towards PT goals: Progressing toward  goals    Frequency    Min 1X/week       AM-PAC PT "6 Clicks" Mobility   Outcome Measure  Help needed turning from your back to your side while in a flat bed without using bedrails?: A Little Help needed moving from lying on your back to sitting on the side of a flat bed without using bedrails?: A Little Help needed moving to and from a bed to a chair (including a wheelchair)?: A Little Help needed standing up from a chair using your arms (e.g., wheelchair or bedside chair)?: A Little Help needed to walk in hospital room?: A Lot Help needed climbing 3-5 steps with a railing? : Total 6 Click Score: 15    End of Session Equipment Utilized During Treatment: Gait belt Activity Tolerance: Patient limited by fatigue Patient left: with call bell/phone within reach;in chair;with chair alarm set Nurse Communication: Mobility status PT Visit Diagnosis: Other abnormalities of gait and mobility (R26.89);Muscle weakness (generalized) (M62.81)     Time: 8469-6295 PT Time Calculation (min) (ACUTE ONLY): 22 min  Charges:    $Therapeutic Exercise: 8-22 mins PT General Charges $$ ACUTE PT VISIT: 1 Visit                     Vickki Muff, PT, DPT   Acute Rehabilitation Department Office [phone] Secure Chat Communication Preferred   Ronnie Derby 01/05/2024, 12:15 PM

## 2024-01-05 NOTE — Progress Notes (Signed)
PROGRESS NOTE    Robert [name]  ZOX:[id number] DOB: [birth year] DOA: 01/02/2024 PCP: Georgann Housekeeper, MD   Brief Narrative: This 83 yrs old Male with PMH significant for hypertension, hyperlipidemia, right subclavian artery stenosis status post stenting in 2016, CAD with CABG in 2008 who presents with generalized aches and malaise.  Patient has developed right shoulder pain a week ago and then it spread to involve the neck , left shoulder , hip and lower extremities.  Labs notable for WBC 14.7, Hb 17.2, lactic acid 2.5, normal CK, UA no bacteriuria or pyuria.  Chest x-ray no acute findings, CT A&P unremarkable.  Patient was given a dose of IV Rocephin,  started on IV fluids and admitted for further evaluation with a concern for polymyalgia rheumatica.  Assessment & Plan:   Principal Problem:   SIRS (systemic inflammatory response syndrome) (HCC) Active Problems:   CAD (coronary artery disease)   Essential (primary) hypertension   Polymyalgia rheumatica (HCC)  SIRS: Generalized ache / Malaise: Patient presented with generalized body ache, malaise, more severe in the neck and shoulders but also involving hip and lower extremities.   He was found to have multiple SIRS criteria concerning for possible infection.  So far no infection has been found on broad ED workup.   Serum CK level is normal. RSV, COVID, flu negative, blood cultures no growth so far. CRP mildly elevated, procalcitonin normal, Check ESR, RF, anti-CCP, TSH, blood cultures no growth so far Follow-up clinical course.  Consider steroids if cultures negative and no infectious process identified. Needs PT and OT evaluation.  CAD: Patient denies any chest pain, SOB, dizziness. Continue Plavix and Imdur.  Essential hypertension: Hold blood pressure medications,  his blood pressure is on the soft side.  Hypokalemia: Replaced.  Continue to monitor.  Acute kidney injury: Serum creatinine jumped from 0.98 >2.34. Obtain renal  ultrasound, continue IV hydration. Avoid nephrotoxic medications  DVT prophylaxis: Lovenox Code Status: Full code Family Communication: No family at bedside Disposition Plan:    Status is: Inpatient Remains inpatient appropriate because:     Admitted for generalized malaise, generalized body ache concerning for polymyalgia rheumatica workup in progress.  Needs PT and OT evaluation   Consultants:  None  Procedures: CT A/P  Antimicrobials: Anti-infectives (From admission, onward)    Start     Dose/Rate Route Frequency Ordered Stop   01/02/24 1515  cefTRIAXone (ROCEPHIN) 1 g in sodium chloride 0.9 % 100 mL IVPB        1 g 200 mL/hr over 30 Minutes Intravenous  Once 01/02/24 1509 01/02/24 1817      Subjective: Patient was seen and examined at bedside. Overnight events noted. Patient reports feeling weak and tired,  still reports pain in hips and shoulders. Denies any fall or injury.  Objective: Vitals:   01/05/24 0200 01/05/24 0300 01/05/24 0708 01/05/24 1137  BP:  124/68 129/73 115/70  Pulse: 91  94 (!) 104  Resp:      Temp:  99.5 F (37.5 C) 98.3 F (36.8 C) 98.4 F (36.9 C)  TempSrc:  Oral Oral Oral  SpO2:  94% 94% 93%  Weight:        Intake/Output Summary (Last 24 hours) at 01/05/2024 1400 Last data filed at 01/05/2024 1300 Gross per 24 hour  Intake 600 ml  Output 100 ml  Net 500 ml   Filed Weights   01/03/24 1414  Weight: 74.8 kg    Examination:  General exam: Appears comfortable, deconditioned, not in any  acute distress. Respiratory system: CTA bilaterally . Respiratory effort normal.  RR 15 Cardiovascular system: S1 & S2 heard, RRR. No JVD, murmurs, rubs, gallops or clicks. Gastrointestinal system: Abdomen is non distended, soft and non tender.  Normal bowel sounds heard. Central nervous system: Alert and oriented X 3. No focal neurological deficits. Extremities: No edema, no cyanosis, no clubbing Skin: No rashes, lesions or ulcers Psychiatry:  Judgement and insight appear normal. Mood & affect appropriate.     Data Reviewed: I have personally reviewed following labs and imaging studies  CBC: Recent Labs  Lab 01/02/24 1319 01/03/24 0519 01/04/24 0314 01/05/24 0725  WBC 14.7* 11.2* 12.4* 11.8*  NEUTROABS 10.4*  --   --   --   HGB 17.2* 15.2 14.0 14.2  HCT 50.4 44.7 41.9 42.4  MCV 91.3 91.8 91.9 92.2  PLT 351 283 287 333   Basic Metabolic Panel: Recent Labs  Lab 01/02/24 1319 01/03/24 0519 01/04/24 0314 01/05/24 0725  NA 133* 137 135 136  K 3.6 3.1* 3.3* 3.6  CL 95* 102 101 102  CO2 26 25 25 25   GLUCOSE 149* 114* 108* 112*  BUN 21 12 13  24*  CREATININE 1.11 0.84 0.98 2.34*  CALCIUM 9.4 8.7* 8.4* 8.6*  MG  --   --  2.1  --   PHOS  --   --  2.6  --    GFR: CrCl cannot be calculated (Unknown ideal weight.). Liver Function Tests: Recent Labs  Lab 01/02/24 1319 01/03/24 0519 01/04/24 0314 01/05/24 0725  AST 46* 38 51* 70*  ALT 52* 39 41 62*  ALKPHOS 76 62 55 71  BILITOT 1.6* 1.5* 1.4* 1.0  PROT 7.8 6.2* 6.0* 6.3*  ALBUMIN 3.3* 2.6* 2.3* 2.3*   No results for input(s): "LIPASE", "AMYLASE" in the last 168 hours. No results for input(s): "AMMONIA" in the last 168 hours. Coagulation Profile: No results for input(s): "INR", "PROTIME" in the last 168 hours. Cardiac Enzymes: Recent Labs  Lab 01/02/24 1319  CKTOTAL 192   BNP (last 3 results) No results for input(s): "PROBNP" in the last 8760 hours. HbA1C: No results for input(s): "HGBA1C" in the last 72 hours. CBG: No results for input(s): "GLUCAP" in the last 168 hours. Lipid Profile: No results for input(s): "CHOL", "HDL", "LDLCALC", "TRIG", "CHOLHDL", "LDLDIRECT" in the last 72 hours. Thyroid Function Tests: Recent Labs    01/02/24 2120  TSH 2.479   Anemia Panel: No results for input(s): "VITAMINB12", "FOLATE", "FERRITIN", "TIBC", "IRON", "RETICCTPCT" in the last 72 hours. Sepsis Labs: Recent Labs  Lab 01/02/24 1338 01/02/24 1720  01/02/24 2120  PROCALCITON  --   --  0.10  LATICACIDVEN 2.5* 2.7* 1.3    Recent Results (from the past 240 hours)  Resp panel by RT-PCR (RSV, Flu A&B, Covid) Anterior Nasal Swab     Status: None   Collection Time: 01/02/24 11:46 AM   Specimen: Anterior Nasal Swab  Result Value Ref Range Status   SARS Coronavirus 2 by RT PCR NEGATIVE NEGATIVE Final   Influenza A by PCR NEGATIVE NEGATIVE Final   Influenza B by PCR NEGATIVE NEGATIVE Final    Comment: (NOTE) The Xpert Xpress SARS-CoV-2/FLU/RSV plus assay is intended as an aid in the diagnosis of influenza from Nasopharyngeal swab specimens and should not be used as a sole basis for treatment. Nasal washings and aspirates are unacceptable for Xpert Xpress SARS-CoV-2/FLU/RSV testing.  Fact Sheet for Patients: BloggerCourse.com  Fact Sheet for Healthcare Providers: SeriousBroker.it  This test is  not yet approved or cleared by the [place] and has been authorized for detection and/or diagnosis of SARS-CoV-2 by FDA under an Emergency Use Authorization (EUA). This EUA will remain in effect (meaning this test can be used) for the duration of the COVID-19 declaration under Section 564(b)(1) of the Act, 21 U.S.C. section 360bbb-3(b)(1), unless the authorization is terminated or revoked.     Resp Syncytial Virus by PCR NEGATIVE NEGATIVE Final    Comment: (NOTE) Fact Sheet for Patients: BloggerCourse.com  Fact Sheet for Healthcare Providers: SeriousBroker.it  This test is not yet approved or cleared by the [place] FDA and has been authorized for detection and/or diagnosis of SARS-CoV-2 by FDA under an Emergency Use Authorization (EUA). This EUA will remain in effect (meaning this test can be used) for the duration of the COVID-19 declaration under Section 564(b)(1) of the Act, 21 U.S.C. section 360bbb-3(b)(1), unless the  authorization is terminated or revoked.  Performed at [facility], [address] [place], [place] [address]   Blood culture (routine x 2)     Status: None (Preliminary result)   Collection Time: 01/02/24 12:03 PM   Specimen: BLOOD  Result Value Ref Range Status   Specimen Description BLOOD LEFT ANTECUBITAL  Final   Special Requests   Final    BOTTLES DRAWN AEROBIC AND ANAEROBIC Blood Culture adequate volume   Culture   Final    NO GROWTH 3 DAYS Performed at [facility], [address], [place], [place] [address]    Report Status PENDING  Incomplete  Blood culture (routine x 2)     Status: None (Preliminary result)   Collection Time: 01/02/24  1:33 PM   Specimen: BLOOD  Result Value Ref Range Status   Specimen Description BLOOD RIGHT ANTECUBITAL  Final   Special Requests   Final    BOTTLES DRAWN AEROBIC AND ANAEROBIC Blood Culture results may not be optimal due to an inadequate volume of blood received in culture bottles   Culture   Final    NO GROWTH 3 DAYS Performed at [facility], [address], [place], [place] [address]    Report Status PENDING  Incomplete  Culture, blood (Routine X 2) w Reflex to ID Panel     Status: None (Preliminary result)   Collection Time: 01/04/24 10:14 PM   Specimen: BLOOD RIGHT ARM  Result Value Ref Range Status   Specimen Description BLOOD RIGHT ARM  Final   Special Requests   Final    BOTTLES DRAWN AEROBIC AND ANAEROBIC Blood Culture adequate volume   Culture   Final    NO GROWTH < 12 HOURS Performed at [facility], [address], [place], [place] [address]    Report Status PENDING  Incomplete  Culture, blood (Routine X 2) w Reflex to ID Panel     Status: None (Preliminary result)   Collection Time: 01/04/24 10:19 PM   Specimen: BLOOD LEFT ARM  Result Value Ref Range Status   Specimen Description BLOOD LEFT ARM  Final   Special Requests   Final    BOTTLES DRAWN AEROBIC AND ANAEROBIC Blood Culture adequate  volume   Culture   Final    NO GROWTH < 12 HOURS Performed at [facility], [address], [place], [place] [address]    Report Status PENDING  Incomplete    Radiology Studies: No results found.  Scheduled Meds:  clopidogrel  75 mg Oral Daily  enoxaparin (LOVENOX) injection  40 mg Subcutaneous Q24H   isosorbide mononitrate  60 mg Oral Daily   metoprolol succinate  75 mg Oral Daily   sodium chloride flush  3 mL Intravenous Q12H   Continuous Infusions:   LOS: 2 days    Time spent: 35 mins    Willeen Niece, MD [facility]   If 7PM-7AM, please contact night-coverage

## 2024-01-05 NOTE — Plan of Care (Signed)
  Problem: Clinical Measurements: Goal: Diagnostic test results will improve Outcome: Completed/Met Goal: Respiratory complications will improve Outcome: Completed/Met   

## 2024-01-06 ENCOUNTER — Inpatient Hospital Stay (HOSPITAL_COMMUNITY): Payer: Medicare Other

## 2024-01-06 DIAGNOSIS — M353 Polymyalgia rheumatica: Secondary | ICD-10-CM

## 2024-01-06 DIAGNOSIS — I10 Essential (primary) hypertension: Secondary | ICD-10-CM | POA: Diagnosis not present

## 2024-01-06 DIAGNOSIS — R6510 Systemic inflammatory response syndrome (SIRS) of non-infectious origin without acute organ dysfunction: Secondary | ICD-10-CM | POA: Diagnosis not present

## 2024-01-06 DIAGNOSIS — E876 Hypokalemia: Secondary | ICD-10-CM

## 2024-01-06 DIAGNOSIS — N179 Acute kidney failure, unspecified: Secondary | ICD-10-CM

## 2024-01-06 LAB — CBC
HCT: 41.7 % (ref 39.0–52.0)
Hemoglobin: 14.2 g/dL (ref 13.0–17.0)
MCH: 30.9 pg (ref 26.0–34.0)
MCHC: 34.1 g/dL (ref 30.0–36.0)
MCV: 90.7 fL (ref 80.0–100.0)
Platelets: 342 10*3/uL (ref 150–400)
RBC: 4.6 MIL/uL (ref 4.22–5.81)
RDW: 13.2 % (ref 11.5–15.5)
WBC: 11.1 10*3/uL — ABNORMAL HIGH (ref 4.0–10.5)
nRBC: 0 % (ref 0.0–0.2)

## 2024-01-06 LAB — BASIC METABOLIC PANEL
Anion gap: 10 (ref 5–15)
BUN: 29 mg/dL — ABNORMAL HIGH (ref 8–23)
CO2: 23 mmol/L (ref 22–32)
Calcium: 8.8 mg/dL — ABNORMAL LOW (ref 8.9–10.3)
Chloride: 103 mmol/L (ref 98–111)
Creatinine, Ser: 2.1 mg/dL — ABNORMAL HIGH (ref 0.61–1.24)
GFR, Estimated: 31 mL/min — ABNORMAL LOW (ref >60)
Glucose, Bld: 104 mg/dL — ABNORMAL HIGH (ref 70–99)
Potassium: 3.8 mmol/L (ref 3.5–5.1)
Sodium: 136 mmol/L (ref 135–145)

## 2024-01-06 LAB — MAGNESIUM: Magnesium: 2.4 mg/dL (ref 1.7–2.4)

## 2024-01-06 LAB — PHOSPHORUS: Phosphorus: 3.6 mg/dL (ref 2.5–4.6)

## 2024-01-06 MED ORDER — SODIUM CHLORIDE 0.9 % IV SOLN
INTRAVENOUS | Status: DC
Start: 2024-01-06 — End: 2024-01-07

## 2024-01-06 MED ORDER — POLYETHYLENE GLYCOL 3350 17 G PO PACK
17.0000 g | PACK | Freq: Every day | ORAL | Status: DC
  Administered 2024-01-07 – 2024-01-09 (×3): 17 g via ORAL
  Filled 2024-01-06 (×3): qty 1

## 2024-01-06 NOTE — Progress Notes (Signed)
No BM documented since 01/01/24, no laxatives ordered, MD aware  Margarita Grizzle

## 2024-01-06 NOTE — Progress Notes (Signed)
Physical Therapy Treatment Patient Details Name: Robert [name] MRN: [id number] DOB: Apr 09, 1941 Today's Date: 01/06/2024   History of Present Illness 83 y.o. male presents to [facility] on 01/02/2024 aches and malaise. Admitted for management of SIRS. Respiratory panel was negative. PMH includes HTN, HLD, CABG.    PT Comments  The pt reports continued fatigue throughout the day, but was agreeable to session with focus on progressing household distance ambulation and strength. Pt reports significant limitation by back soreness, used soft tissue massage, seated ROM exercises and heat to alleviate some pain and improve mobility. The pt was able to progress ambulation distance well this session, continues to need physical assist to complete sit-stand transfers and BUE support for gait as well as max cues for posture, positioning in RW, and max encouragement. Noted pt and his son declined SNF, will benefit from max HHPT and pt will need assist for all OOB mobility as he lacks the power to stand on his own and strength, balance, and endurance to ambulate household distances without assistance at this time. Will continue to benefit from skilled PT to achieve these goals.     If plan is discharge home, recommend the following: A little help with walking and/or transfers;A lot of help with bathing/dressing/bathroom;Assistance with cooking/housework;Assist for transportation   Can travel by private vehicle     Yes  Equipment Recommendations  Rolling walker (2 wheels);BSC/3in1    Recommendations for Other Services       Precautions / Restrictions Precautions Precautions: Fall Precaution Comments: poor endurance Restrictions Weight Bearing Restrictions Per Provider Order: No     Mobility  Bed Mobility Overal bed mobility: Needs Assistance Bed Mobility: Supine to Sit     Supine to sit: Min assist, HOB elevated     General bed mobility comments: minA to elevate trunk, pt needs assist for RLE,  assist to scoot to EOB    Transfers Overall transfer level: Needs assistance Equipment used: Rolling walker (2 wheels) Transfers: Sit to/from Stand Sit to Stand: Min assist           General transfer comment: minA with posterior lean initially, cues for hand placement and pt using some momentum    Ambulation/Gait Ambulation/Gait assistance: Min assist Gait Distance (Feet): 45 Feet Assistive device: Rolling walker (2 wheels) Gait Pattern/deviations: Step-through pattern, Decreased stride length, Trunk flexed Gait velocity: reduced Gait velocity interpretation: <1.31 ft/sec, indicative of household ambulator   General Gait Details: slowed gait, trunk flexed, poor tolerance due to bilateral foot pain today, encouraged to bring in shoes. max cues for had and neck posture, pt making little change      Balance Overall balance assessment: Needs assistance Sitting-balance support: No upper extremity supported, Feet supported Sitting balance-Leahy Scale: Good     Standing balance support: Bilateral upper extremity supported, Reliant on assistive device for balance Standing balance-Leahy Scale: Poor Standing balance comment: dependent on BUE support and minA                            Cognition Arousal: Alert Behavior During Therapy: WFL for tasks assessed/performed Overall Cognitive Status: Within Functional Limits for tasks assessed                                 General Comments: pt flat due to fatigue, able to follow commands well. slightly increased time due to slow moving due to fatigue/pain  Exercises Other Exercises Other Exercises: soft tissue massage to L thoracic paraspinals Other Exercises: seated trunk rotations x10 each direction Other Exercises: seated trunk flexion and extension x 10 each direction Other Exercises: cervical rotation and sidebending stretches x5 in each direction    General Comments        Pertinent  Vitals/Pain Pain Assessment Pain Assessment: Faces Pain Score: 4  Faces Pain Scale: Hurts little more Pain Location: lower thoracic area, paraspinal muscle soreness Pain Descriptors / Indicators: Aching, Discomfort, Grimacing, Sore Pain Intervention(s): Limited activity within patient's tolerance, Monitored during session, Other (comment), Heat applied (massage)     PT Goals (current goals can now be found in the care plan section) Acute Rehab PT Goals Patient Stated Goal: to return to independence PT Goal Formulation: With patient Time For Goal Achievement: 01/17/24 Potential to Achieve Goals: Good Progress towards PT goals: Progressing toward goals    Frequency    Min 1X/week       AM-PAC PT "6 Clicks" Mobility   Outcome Measure  Help needed turning from your back to your side while in a flat bed without using bedrails?: A Little Help needed moving from lying on your back to sitting on the side of a flat bed without using bedrails?: A Little Help needed moving to and from a bed to a chair (including a wheelchair)?: A Little Help needed standing up from a chair using your arms (e.g., wheelchair or bedside chair)?: A Little Help needed to walk in hospital room?: A Lot Help needed climbing 3-5 steps with a railing? : A Lot 6 Click Score: 16    End of Session Equipment Utilized During Treatment: Gait belt Activity Tolerance: Patient limited by fatigue Patient left: with call bell/phone within reach;in chair;with chair alarm set Nurse Communication: Mobility status PT Visit Diagnosis: Other abnormalities of gait and mobility (R26.89);Muscle weakness (generalized) (M62.81)     Time: 1610-9604 PT Time Calculation (min) (ACUTE ONLY): 29 min  Charges:    $Gait Training: 8-22 mins $Therapeutic Exercise: 8-22 mins PT General Charges $$ ACUTE PT VISIT: 1 Visit                     Vickki Muff, PT, DPT   Acute Rehabilitation Department Office [phone] Secure Chat  Communication Preferred   Ronnie Derby 01/06/2024, 4:51 PM

## 2024-01-06 NOTE — Progress Notes (Addendum)
[facility]                                                                               Robert [name], is a 83 y.o. male, DOB - 01/25/1941, JWJ:[id number] Admit date - 01/02/2024    Outpatient Primary MD for the patient is Georgann Housekeeper, MD  LOS - 3  days    Brief summary  83 yrs old Male with PMH significant for hypertension, hyperlipidemia, right subclavian artery stenosis status post stenting in 2016, CAD with CABG in 2008 who presents with generalized aches and malaise.  Patient has developed right shoulder pain a week ago and then it spread to involve the neck , left shoulder , hip and lower extremities.  Labs notable for WBC 14.7, Hb 17.2, lactic acid 2.5, normal CK, UA no bacteriuria or pyuria.  Chest x-ray no acute findings, CT A&P unremarkable.  Patient was given a dose of IV Rocephin,  started on IV fluids and admitted for further evaluation with a concern for polymyalgia rheumatica.    Assessment & Plan    Assessment and Plan:  SIRS Generalized weakness/ Aches/ Malaise No signs of infection.  Blood cultures are negative.  CK level wnl.  RSV, COVID, flu negative, blood cultures no growth so far.  CRP elevated at 16.5. TSH wnl Anti CCP ordered for tomorrow.  Therapy evaluations ordered. Awaiting recommendations.     Aki suspect from urinary retention Baseline creatinine around 0.9, admitted with a creatinine of 2.34.  US showed Distended urinary bladder which patient could not empty. Mild hydronephrosis since 01/02/2024 is presumably related. Continue with IV FLUIDS.  Place foley catheter . Recheck renal parameters in am.    Hypokalemia replaced   Hypertension:  Well controlled. Continue with metoprolol and imdur.    Elevated lactic acid Normalized.    Estimated body mass index is 24.35 kg/m as calculated from the following:   Height as of 03/27/23: 5\' 9"  (1.753 m).   Weight as of this encounter: 74.8 kg.  Code Status: full code.  DVT  Prophylaxis:  enoxaparin (LOVENOX) injection 30 mg Start: 01/05/24 2200   Level of Care: Level of care: Telemetry Medical Family Communication: none at bedside, called his son, unable to reach him.   Disposition Plan:     Remains inpatient appropriate:  urinary retention  Procedures:  None.   Consultants:   none  Antimicrobials:   Anti-infectives (From admission, onward)    Start     Dose/Rate Route Frequency Ordered Stop   01/02/24 1515  cefTRIAXone (ROCEPHIN) 1 g in sodium chloride 0.9 % 100 mL IVPB        1 g 200 mL/hr over 30 Minutes Intravenous  Once 01/02/24 1509 01/02/24 1817        Medications  Scheduled Meds:  clopidogrel  75 mg Oral Daily   enoxaparin (LOVENOX) injection  30 mg Subcutaneous Q24H   isosorbide mononitrate  60 mg Oral Daily   metoprolol succinate  75 mg Oral Daily   polyethylene glycol  17 g Oral Daily   sodium chloride flush  3 mL Intravenous Q12H   Continuous Infusions:  sodium chloride     PRN  Meds:.acetaminophen **OR** acetaminophen, fentaNYL (SUBLIMAZE) injection, ondansetron **OR** ondansetron (ZOFRAN) IV, mouth rinse, oxyCODONE, senna-docusate    Subjective:   Robert [name] was seen and examined today.  Difficulty urinating overnight.   Objective:   Vitals:   01/05/24 1817 01/05/24 2026 01/06/24 0435 01/06/24 0712  BP: 111/77 117/64 115/69 127/73  Pulse: 99 (!) 101 (!) 108 88  Resp:  20 20   Temp: 99.3 F (37.4 C) 98.6 F (37 C) 98 F (36.7 C) 98.2 F (36.8 C)  TempSrc: Oral Oral Oral Oral  SpO2: 98% 92% 93% 98%  Weight:        Intake/Output Summary (Last 24 hours) at 01/06/2024 1127 Last data filed at 01/06/2024 4098 Gross per 24 hour  Intake 974.82 ml  Output 1550 ml  Net -575.18 ml   Filed Weights   01/03/24 1414  Weight: 74.8 kg     Exam General: Alert and oriented x 3, NAD Cardiovascular: S1 S2 auscultated, no murmurs, RRR Respiratory: Clear to auscultation bilaterally, no wheezing, rales or  rhonchi Gastrointestinal: Soft, nontender, nondistended, + bowel sounds Ext: no pedal edema bilaterally Neuro: AAOx3, Cr N's II- XII. Strength 5/5 upper and lower extremities bilaterally Skin: No rashes Psych: Normal affect and demeanor, alert and oriented x3    Data Reviewed:  I have personally reviewed following labs and imaging studies   CBC Lab Results  Component Value Date   WBC 11.1 (H) 01/06/2024   RBC 4.60 01/06/2024   HGB 14.2 01/06/2024   HCT 41.7 01/06/2024   MCV 90.7 01/06/2024   MCH 30.9 01/06/2024   PLT 342 01/06/2024   MCHC 34.1 01/06/2024   RDW 13.2 01/06/2024   LYMPHSABS 2.5 01/02/2024   MONOABS 1.5 (H) 01/02/2024   EOSABS 0.1 01/02/2024   BASOSABS 0.1 01/02/2024     Last metabolic panel Lab Results  Component Value Date   NA 136 01/06/2024   K 3.8 01/06/2024   CL 103 01/06/2024   CO2 23 01/06/2024   BUN 29 (H) 01/06/2024   CREATININE 2.10 (H) 01/06/2024   GLUCOSE 104 (H) 01/06/2024   GFRNONAA 31 (L) 01/06/2024   GFRAA >60 08/25/2020   CALCIUM 8.8 (L) 01/06/2024   PHOS 3.6 01/06/2024   PROT 6.3 (L) 01/05/2024   ALBUMIN 2.3 (L) 01/05/2024   BILITOT 1.0 01/05/2024   ALKPHOS 71 01/05/2024   AST 70 (H) 01/05/2024   ALT 62 (H) 01/05/2024   ANIONGAP 10 01/06/2024    CBG (last 3)  No results for input(s): "GLUCAP" in the last 72 hours.    Coagulation Profile: No results for input(s): "INR", "PROTIME" in the last 168 hours.   Radiology Studies: US RENAL Result Date: 01/06/2024 CLINICAL DATA:  Acute kidney injury EXAM: RENAL / URINARY TRACT ULTRASOUND COMPLETE COMPARISON:  Abdominal CT from 4 days prior FINDINGS: Right Kidney: Renal measurements: 12 x 7 x 7 cm = volume: 310 mL. Mild hydronephrosis. No focal cortical finding or abnormal echogenicity. Left Kidney: Renal measurements: 11.5 x 6.5 x 6 cm = volume: 230 mL. Mild pelviectasis. No focal cortical finding Bladder: Distended bladder, volume not calculated, without focal abnormality or  debris. Reportedly the patient was unable to void. IMPRESSION: Distended urinary bladder which patient could not empty. Mild hydronephrosis since 01/02/2024 is presumably related. Electronically Signed   By: Tiburcio Pea M.D.   On: 01/06/2024 06:49       Kathlen Mody M.D. [facility] 01/06/2024, 11:27 AM  Available via Epic secure chat 7am-7pm After 7 pm, please  refer to night coverage provider listed on amion.

## 2024-01-06 NOTE — Plan of Care (Signed)
  Problem: Health Behavior/Discharge Planning: Goal: Ability to manage health-related needs will improve Outcome: Progressing   Problem: Clinical Measurements: Goal: Ability to maintain clinical measurements within normal limits will improve Outcome: Progressing Goal: Will remain free from infection Outcome: Progressing Goal: Cardiovascular complication will be avoided Outcome: Progressing   Problem: Activity: Goal: Risk for activity intolerance will decrease Outcome: Progressing   Problem: Nutrition: Goal: Adequate nutrition will be maintained Outcome: Progressing   Problem: Coping: Goal: Level of anxiety will decrease Outcome: Progressing   Problem: Elimination: Goal: Will not experience complications related to bowel motility Outcome: Progressing Goal: Will not experience complications related to urinary retention Outcome: Progressing   Problem: Pain Managment: Goal: General experience of comfort will improve and/or be controlled Outcome: Progressing   Problem: Safety: Goal: Ability to remain free from injury will improve Outcome: Progressing   Problem: Skin Integrity: Goal: Risk for impaired skin integrity will decrease Outcome: Progressing

## 2024-01-07 DIAGNOSIS — I10 Essential (primary) hypertension: Secondary | ICD-10-CM | POA: Diagnosis not present

## 2024-01-07 DIAGNOSIS — R6510 Systemic inflammatory response syndrome (SIRS) of non-infectious origin without acute organ dysfunction: Secondary | ICD-10-CM | POA: Diagnosis not present

## 2024-01-07 DIAGNOSIS — M353 Polymyalgia rheumatica: Secondary | ICD-10-CM | POA: Diagnosis not present

## 2024-01-07 DIAGNOSIS — E876 Hypokalemia: Secondary | ICD-10-CM | POA: Diagnosis not present

## 2024-01-07 LAB — CULTURE, BLOOD (ROUTINE X 2)
Culture: NO GROWTH
Culture: NO GROWTH
Special Requests: ADEQUATE

## 2024-01-07 LAB — BASIC METABOLIC PANEL
Anion gap: 8 (ref 5–15)
BUN: 11 mg/dL (ref 8–23)
CO2: 24 mmol/L (ref 22–32)
Calcium: 8.5 mg/dL — ABNORMAL LOW (ref 8.9–10.3)
Chloride: 105 mmol/L (ref 98–111)
Creatinine, Ser: 0.88 mg/dL (ref 0.61–1.24)
GFR, Estimated: >60 mL/min (ref >60)
Glucose, Bld: 91 mg/dL (ref 70–99)
Potassium: 4 mmol/L (ref 3.5–5.1)
Sodium: 137 mmol/L (ref 135–145)

## 2024-01-07 MED ORDER — ENOXAPARIN SODIUM 40 MG/0.4ML IJ SOSY
40.0000 mg | PREFILLED_SYRINGE | INTRAMUSCULAR | Status: DC
  Administered 2024-01-07 – 2024-01-08 (×2): 40 mg via SUBCUTANEOUS
  Filled 2024-01-07 (×2): qty 0.4

## 2024-01-07 MED ORDER — CHLORHEXIDINE GLUCONATE CLOTH 2 % EX PADS
6.0000 | MEDICATED_PAD | Freq: Every day | CUTANEOUS | Status: DC
  Administered 2024-01-07 – 2024-01-09 (×3): 6 via TOPICAL

## 2024-01-07 NOTE — Progress Notes (Signed)
Mobility Specialist: Progress Note   01/07/24 1202  Mobility  Activity Ambulated with assistance in room  Level of Assistance Contact guard assist, steadying assist  Assistive Device Front wheel walker  Distance Ambulated (ft) 20 ft  Activity Response Tolerated well  Mobility Referral Yes  Mobility visit 1 Mobility  Mobility Specialist Start Time (ACUTE ONLY) 1016  Mobility Specialist Stop Time (ACUTE ONLY) 1028  Mobility Specialist Time Calculation (min) (ACUTE ONLY) 12 min    Pt was agreeable to mobility session - received in bed. C/o RLE pain that improved once he was up moving. CG throughout. Ambulated to the door and then to the chair. Left in chair with all needs met, call bell in reach. Chair alarm on.   Maurene Capes Mobility Specialist Please contact via SecureChat or Rehab office at [phone]

## 2024-01-07 NOTE — Progress Notes (Signed)
[facility]                                                                               Robert [name], is a 83 y.o. male, DOB - 08/09/1941, UJW:[id number] Admit date - 01/02/2024    Outpatient Primary MD for the patient is Georgann Housekeeper, MD  LOS - 4  days    Brief summary  83 yrs old Male with PMH significant for hypertension, hyperlipidemia, right subclavian artery stenosis status post stenting in 2016, CAD with CABG in 2008 who presents with generalized aches and malaise.  Patient has developed right shoulder pain a week ago and then it spread to involve the neck , left shoulder , hip and lower extremities.  Labs notable for WBC 14.7, Hb 17.2, lactic acid 2.5, normal CK, UA no bacteriuria or pyuria.  Chest x-ray no acute findings, CT A&P unremarkable.  Patient was given a dose of IV Rocephin,  started on IV fluids and admitted for further evaluation with a concern for polymyalgia rheumatica.    Assessment & Plan    Assessment and Plan:  SIRS Generalized weakness/ Aches/ Malaise No signs of infection.  Blood cultures are negative.  CK level wnl.  RSV, COVID, flu negative, blood cultures no growth so far.  CRP elevated at 16.5. TSH wnl Anti CCP ordered and pending.  Pt reports all his symptoms of body aches and weakness have improved.     AKI suspect from urinary retention Baseline creatinine around 0.9, admitted with a creatinine of 2.34.  US showed Distended urinary bladder which patient could not empty. Mild hydronephrosis since 01/02/2024 is presumably related. Started on IV fluids and foley catheter placed. Repeat renal parameters have improved.  He might need to be discharged to SNF with foley and follow up with Urology as outpatient for voiding trial.     Hypokalemia replaced, repeat level wnl.    Hypertension:  Well controlled. Continue with metoprolol and imdur.    Elevated lactic acid Normalized.    Estimated body mass index is 24.35  kg/m as calculated from the following:   Height as of this encounter: 5\' 9"  (1.753 m).   Weight as of this encounter: 74.8 kg.  Code Status: full code.  DVT Prophylaxis:  enoxaparin (LOVENOX) injection 40 mg Start: 01/07/24 2200   Level of Care: Level of care: Telemetry Medical Family Communication: none at bedside, called his son, unable to reach him.   Disposition Plan:     Remains inpatient appropriate:  urinary retention  Procedures:  None.   Consultants:   none  Antimicrobials:   Anti-infectives (From admission, onward)    Start     Dose/Rate Route Frequency Ordered Stop   01/02/24 1515  cefTRIAXone (ROCEPHIN) 1 g in sodium chloride 0.9 % 100 mL IVPB        1 g 200 mL/hr over 30 Minutes Intravenous  Once 01/02/24 1509 01/02/24 1817        Medications  Scheduled Meds:  Chlorhexidine Gluconate Cloth  6 each Topical Daily   clopidogrel  75 mg Oral Daily   enoxaparin (LOVENOX) injection  40 mg Subcutaneous Q24H   isosorbide mononitrate  60  mg Oral Daily   metoprolol succinate  75 mg Oral Daily   polyethylene glycol  17 g Oral Daily   sodium chloride flush  3 mL Intravenous Q12H   Continuous Infusions:   PRN Meds:.acetaminophen **OR** acetaminophen, fentaNYL (SUBLIMAZE) injection, ondansetron **OR** ondansetron (ZOFRAN) IV, mouth rinse, oxyCODONE, senna-docusate    Subjective:   [name] was seen and examined today.  Difficulty urinating overnight.   Objective:   Vitals:   01/06/24 2304 01/07/24 0537 01/07/24 0818 01/07/24 1510  BP: 114/72 137/86 126/79 117/68  Pulse: 91 (!) 103 83 85  Resp: 18 18 18 18   Temp: 98.9 F (37.2 C) 98.4 F (36.9 C) 98.3 F (36.8 C) 98 F (36.7 C)  TempSrc: Oral Oral Oral Oral  SpO2: 94% 97% 98% 98%  Weight:      Height: 5\' 9"  (1.753 m)       Intake/Output Summary (Last 24 hours) at 01/07/2024 1641 Last data filed at 01/07/2024 1500 Gross per 24 hour  Intake 3454.68 ml  Output 1900 ml  Net 1554.68 ml   Filed  Weights   01/03/24 1414  Weight: 74.8 kg     Exam General exam: Appears calm and comfortable  Respiratory system: Clear to auscultation. Respiratory effort normal. Cardiovascular system: S1 & S2 heard, RRR. Gastrointestinal system: Abdomen is nondistended, soft and nontender. Central nervous system: Alert and oriented.  Extremities: Symmetric 5 x 5 power. Skin: No rashes, lesions or ulcers Psychiatry:  mood is appropriate.    Data Reviewed:  I have personally reviewed following labs and imaging studies   CBC Lab Results  Component Value Date   WBC 11.1 (H) 01/06/2024   RBC 4.60 01/06/2024   HGB 14.2 01/06/2024   HCT 41.7 01/06/2024   MCV 90.7 01/06/2024   MCH 30.9 01/06/2024   PLT 342 01/06/2024   MCHC 34.1 01/06/2024   RDW 13.2 01/06/2024   LYMPHSABS 2.5 01/02/2024   MONOABS 1.5 (H) 01/02/2024   EOSABS 0.1 01/02/2024   BASOSABS 0.1 01/02/2024     Last metabolic panel Lab Results  Component Value Date   NA 137 01/07/2024   K 4.0 01/07/2024   CL 105 01/07/2024   CO2 24 01/07/2024   BUN 11 01/07/2024   CREATININE 0.88 01/07/2024   GLUCOSE 91 01/07/2024   GFRNONAA >60 01/07/2024   GFRAA >60 08/25/2020   CALCIUM 8.5 (L) 01/07/2024   PHOS 3.6 01/06/2024   PROT 6.3 (L) 01/05/2024   ALBUMIN 2.3 (L) 01/05/2024   BILITOT 1.0 01/05/2024   ALKPHOS 71 01/05/2024   AST 70 (H) 01/05/2024   ALT 62 (H) 01/05/2024   ANIONGAP 8 01/07/2024    CBG (last 3)  No results for input(s): "GLUCAP" in the last 72 hours.    Coagulation Profile: No results for input(s): "INR", "PROTIME" in the last 168 hours.   Radiology Studies: US RENAL Result Date: 01/06/2024 CLINICAL DATA:  Acute kidney injury EXAM: RENAL / URINARY TRACT ULTRASOUND COMPLETE COMPARISON:  Abdominal CT from 4 days prior FINDINGS: Right Kidney: Renal measurements: 12 x 7 x 7 cm = volume: 310 mL. Mild hydronephrosis. No focal cortical finding or abnormal echogenicity. Left Kidney: Renal measurements: 11.5 x  6.5 x 6 cm = volume: 230 mL. Mild pelviectasis. No focal cortical finding Bladder: Distended bladder, volume not calculated, without focal abnormality or debris. Reportedly the patient was unable to void. IMPRESSION: Distended urinary bladder which patient could not empty. Mild hydronephrosis since 01/02/2024 is presumably related. Electronically Signed  By: Tiburcio Pea M.D.   On: 01/06/2024 06:49       Kathlen Mody M.D. [facility] 01/07/2024, 4:41 PM  Available via Epic secure chat 7am-7pm After 7 pm, please refer to night coverage provider listed on amion.

## 2024-01-07 NOTE — Plan of Care (Signed)
  Problem: Health Behavior/Discharge Planning: Goal: Ability to manage health-related needs will improve Outcome: Progressing   Problem: Clinical Measurements: Goal: Ability to maintain clinical measurements within normal limits will improve Outcome: Progressing Goal: Will remain free from infection Outcome: Progressing Goal: Cardiovascular complication will be avoided Outcome: Progressing   Problem: Activity: Goal: Risk for activity intolerance will decrease Outcome: Progressing   Problem: Nutrition: Goal: Adequate nutrition will be maintained Outcome: Progressing   Problem: Coping: Goal: Level of anxiety will decrease Outcome: Progressing   Problem: Elimination: Goal: Will not experience complications related to bowel motility Outcome: Progressing Goal: Will not experience complications related to urinary retention Outcome: Progressing   Problem: Pain Managment: Goal: General experience of comfort will improve and/or be controlled Outcome: Progressing   Problem: Safety: Goal: Ability to remain free from injury will improve Outcome: Progressing   Problem: Skin Integrity: Goal: Risk for impaired skin integrity will decrease Outcome: Progressing

## 2024-01-08 DIAGNOSIS — R6510 Systemic inflammatory response syndrome (SIRS) of non-infectious origin without acute organ dysfunction: Secondary | ICD-10-CM | POA: Diagnosis not present

## 2024-01-08 LAB — BASIC METABOLIC PANEL
Anion gap: 10 (ref 5–15)
BUN: 10 mg/dL (ref 8–23)
CO2: 22 mmol/L (ref 22–32)
Calcium: 8.3 mg/dL — ABNORMAL LOW (ref 8.9–10.3)
Chloride: 105 mmol/L (ref 98–111)
Creatinine, Ser: 0.76 mg/dL (ref 0.61–1.24)
GFR, Estimated: >60 mL/min (ref >60)
Glucose, Bld: 135 mg/dL — ABNORMAL HIGH (ref 70–99)
Potassium: 3.8 mmol/L (ref 3.5–5.1)
Sodium: 137 mmol/L (ref 135–145)

## 2024-01-08 LAB — CYCLIC CITRUL PEPTIDE ANTIBODY, IGG/IGA: CCP Antibodies IgG/IgA: 4 U (ref 0–19)

## 2024-01-08 NOTE — Care Management Important Message (Signed)
Important Message  Patient Details  Name: Robert [name] MRN: [id number] Date of Birth: May 05, 1941   Important Message Given:  Yes - Medicare IM     Dorena Bodo 01/08/2024, 4:09 PM

## 2024-01-08 NOTE — Progress Notes (Signed)
PROGRESS NOTE   Robert [name]  YIR:[id number] DOB: 02/13/1941 DOA: 01/02/2024 PCP: Georgann Housekeeper, MD    Brief Narrative:  This 83 yrs old Male with PMH significant for hypertension, hyperlipidemia, right subclavian artery stenosis status post stenting in 2016, CAD with CABG in 2008 who presents with generalized aches and malaise.  Patient has developed right shoulder pain a week ago and then it spread to involve the neck , left shoulder , hip and lower extremities.  Labs notable for WBC 14.7, Hb 17.2, lactic acid 2.5, normal CK, UA no bacteriuria or pyuria.  Chest x-ray no acute findings, CT A&P unremarkable.  Patient was given a dose of IV Rocephin,  started on IV fluids and admitted for further evaluation with a concern for polymyalgia rheumatica.   Assessment & Plan:   Principal Problem:   SIRS (systemic inflammatory response syndrome) (HCC) Active Problems:   CAD (coronary artery disease)   Essential (primary) hypertension   Polymyalgia rheumatica (HCC)   SIRS: Generalized weakness/ Aches/ Malaise: No signs of infection.  Blood cultures are negative.  CK level wnl.  RSV, COVID, flu negative, blood cultures no growth so far.  CRP elevated at 16.5. TSH wnl Anti CCP ordered and pending.  Pt reports all his symptoms of body aches and weakness have improved.    AKI suspect from urinary retention: Baseline creatinine around 0.9, up trended creatinine to 2.34.  US showed Distended urinary bladder which patient could not empty. Mild hydronephrosis since 01/02/2024 is presumably related. Started on IV fluids and foley catheter placed. Repeat renal parameters have improved.  He might need to be discharged to SNF with foley and follow up with Urology as outpatient for voiding trial.    Hypokalemia: Replaced, continue to monitor  Hypertension:  Well controlled. Continue with metoprolol and imdur.   Elevated lactic acid: Normalized.      Estimated body mass index is 24.35 kg/m  as calculated from the following:   Height as of this encounter: 5\' 9"  (1.753 m).   Weight as of this encounter: 74.8 kg.   DVT prophylaxis: Lovenox Code Status: Full code Family Communication: No family at bed side. Disposition Plan:    Status is: Inpatient Remains inpatient appropriate because: Ongoing urinary retention requiring Foley catheterization. Pending SNF placement.    Consultants:  None  Procedures: Foley insertion  Antimicrobials:  Anti-infectives (From admission, onward)    Start     Dose/Rate Route Frequency Ordered Stop   01/02/24 1515  cefTRIAXone (ROCEPHIN) 1 g in sodium chloride 0.9 % 100 mL IVPB        1 g 200 mL/hr over 30 Minutes Intravenous  Once 01/02/24 1509 01/02/24 1817      Subjective: Patient was seen and examined at bedside.  Overnight events noted.   Patient reports doing much better,  states he does not want to go to skilled nursing facility but  wants to go home.  Objective: Vitals:   01/07/24 1510 01/07/24 2040 01/08/24 0431 01/08/24 0748  BP: 117/68 138/80 (!) 148/84 (!) 140/78  Pulse: 85 84 81 87  Resp: 18 17 18 16   Temp: 98 F (36.7 C) 97.9 F (36.6 C) 98 F (36.7 C) 98.2 F (36.8 C)  TempSrc: Oral Oral Oral Oral  SpO2: 98% 96% 98% 95%  Weight:  74.6 kg    Height:  5\' 9"  (1.753 m)      Intake/Output Summary (Last 24 hours) at 01/08/2024 1210 Last data filed at 01/08/2024 0600 Gross per 24  hour  Intake 1207.97 ml  Output 2475 ml  Net -1267.03 ml   Filed Weights   01/03/24 1414 01/07/24 2040  Weight: 74.8 kg 74.6 kg    Examination:  General exam: Appears calm and comfortable, deconditioned, not in any acute distress. Respiratory system: Clear to auscultation. Respiratory effort normal.  RR 16 Cardiovascular system: S1 & S2 heard, RRR. No JVD, murmurs, rubs, gallops or clicks.  Gastrointestinal system: Abdomen is non distended, soft and non tender.  Normal bowel sounds heard. Central nervous system: Alert and  oriented x 2. No focal neurological deficits. Extremities: No edema, no cyanosis, no clubbing Skin: No rashes, lesions or ulcers Psychiatry: Judgement and insight appear normal. Mood & affect appropriate.     Data Reviewed: I have personally reviewed following labs and imaging studies  CBC: Recent Labs  Lab 01/02/24 1319 01/03/24 0519 01/04/24 0314 01/05/24 0725 01/06/24 0317  WBC 14.7* 11.2* 12.4* 11.8* 11.1*  NEUTROABS 10.4*  --   --   --   --   HGB 17.2* 15.2 14.0 14.2 14.2  HCT 50.4 44.7 41.9 42.4 41.7  MCV 91.3 91.8 91.9 92.2 90.7  PLT 351 283 287 333 342   Basic Metabolic Panel: Recent Labs  Lab 01/04/24 0314 01/05/24 0725 01/06/24 0317 01/07/24 0734 01/08/24 0102  NA 135 136 136 137 137  K 3.3* 3.6 3.8 4.0 3.8  CL 101 102 103 105 105  CO2 25 25 23 24 22   GLUCOSE 108* 112* 104* 91 135*  BUN 13 24* 29* 11 10  CREATININE 0.98 2.34* 2.10* 0.88 0.76  CALCIUM 8.4* 8.6* 8.8* 8.5* 8.3*  MG 2.1  --  2.4  --   --   PHOS 2.6  --  3.6  --   --    GFR: Estimated Creatinine Clearance: 71.2 mL/min (by C-G formula based on SCr of 0.76 mg/dL). Liver Function Tests: Recent Labs  Lab 01/02/24 1319 01/03/24 0519 01/04/24 0314 01/05/24 0725  AST 46* 38 51* 70*  ALT 52* 39 41 62*  ALKPHOS 76 62 55 71  BILITOT 1.6* 1.5* 1.4* 1.0  PROT 7.8 6.2* 6.0* 6.3*  ALBUMIN 3.3* 2.6* 2.3* 2.3*   No results for input(s): "LIPASE", "AMYLASE" in the last 168 hours. No results for input(s): "AMMONIA" in the last 168 hours. Coagulation Profile: No results for input(s): "INR", "PROTIME" in the last 168 hours. Cardiac Enzymes: Recent Labs  Lab 01/02/24 1319  CKTOTAL 192   BNP (last 3 results) No results for input(s): "PROBNP" in the last 8760 hours. HbA1C: No results for input(s): "HGBA1C" in the last 72 hours. CBG: No results for input(s): "GLUCAP" in the last 168 hours. Lipid Profile: No results for input(s): "CHOL", "HDL", "LDLCALC", "TRIG", "CHOLHDL", "LDLDIRECT" in the  last 72 hours. Thyroid Function Tests: No results for input(s): "TSH", "T4TOTAL", "FREET4", "T3FREE", "THYROIDAB" in the last 72 hours. Anemia Panel: No results for input(s): "VITAMINB12", "FOLATE", "FERRITIN", "TIBC", "IRON", "RETICCTPCT" in the last 72 hours. Sepsis Labs: Recent Labs  Lab 01/02/24 1338 01/02/24 1720 01/02/24 2120  PROCALCITON  --   --  0.10  LATICACIDVEN 2.5* 2.7* 1.3    Recent Results (from the past 240 hours)  Resp panel by RT-PCR (RSV, Flu A&B, Covid) Anterior Nasal Swab     Status: None   Collection Time: 01/02/24 11:46 AM   Specimen: Anterior Nasal Swab  Result Value Ref Range Status   SARS Coronavirus 2 by RT PCR NEGATIVE NEGATIVE Final   Influenza A by PCR  NEGATIVE NEGATIVE Final   Influenza B by PCR NEGATIVE NEGATIVE Final    Comment: (NOTE) The Xpert Xpress SARS-CoV-2/FLU/RSV plus assay is intended as an aid in the diagnosis of influenza from Nasopharyngeal swab specimens and should not be used as a sole basis for treatment. Nasal washings and aspirates are unacceptable for Xpert Xpress SARS-CoV-2/FLU/RSV testing.  Fact Sheet for Patients: BloggerCourse.com  Fact Sheet for Healthcare Providers: SeriousBroker.it  This test is not yet approved or cleared by the [place] FDA and has been authorized for detection and/or diagnosis of SARS-CoV-2 by FDA under an Emergency Use Authorization (EUA). This EUA will remain in effect (meaning this test can be used) for the duration of the COVID-19 declaration under Section 564(b)(1) of the Act, 21 U.S.C. section 360bbb-3(b)(1), unless the authorization is terminated or revoked.     Resp Syncytial Virus by PCR NEGATIVE NEGATIVE Final    Comment: (NOTE) Fact Sheet for Patients: BloggerCourse.com  Fact Sheet for Healthcare Providers: SeriousBroker.it  This test is not yet approved or cleared by the  [place] FDA and has been authorized for detection and/or diagnosis of SARS-CoV-2 by FDA under an Emergency Use Authorization (EUA). This EUA will remain in effect (meaning this test can be used) for the duration of the COVID-19 declaration under Section 564(b)(1) of the Act, 21 U.S.C. section 360bbb-3(b)(1), unless the authorization is terminated or revoked.  Performed at [facility], [address], [place], [place] [address]   Blood culture (routine x 2)     Status: None   Collection Time: 01/02/24 12:03 PM   Specimen: BLOOD  Result Value Ref Range Status   Specimen Description BLOOD LEFT ANTECUBITAL  Final   Special Requests   Final    BOTTLES DRAWN AEROBIC AND ANAEROBIC Blood Culture adequate volume   Culture   Final    NO GROWTH 5 DAYS Performed at [facility], [address] [place], [place] [address]    Report Status 01/07/2024 FINAL  Final  Blood culture (routine x 2)     Status: None   Collection Time: 01/02/24  1:33 PM   Specimen: BLOOD  Result Value Ref Range Status   Specimen Description BLOOD RIGHT ANTECUBITAL  Final   Special Requests   Final    BOTTLES DRAWN AEROBIC AND ANAEROBIC Blood Culture results may not be optimal due to an inadequate volume of blood received in culture bottles   Culture   Final    NO GROWTH 5 DAYS Performed at [facility], [address], [place], [place] [address]    Report Status 01/07/2024 FINAL  Final  Culture, blood (Routine X 2) w Reflex to ID Panel     Status: None (Preliminary result)   Collection Time: 01/04/24 10:14 PM   Specimen: BLOOD RIGHT ARM  Result Value Ref Range Status   Specimen Description BLOOD RIGHT ARM  Final   Special Requests   Final    BOTTLES DRAWN AEROBIC AND ANAEROBIC Blood Culture adequate volume   Culture   Final    NO GROWTH 4 DAYS Performed at [facility], [address] [place], [place] [address]    Report Status PENDING  Incomplete  Culture, blood (Routine X 2)  w Reflex to ID Panel     Status: None (Preliminary result)   Collection Time: 01/04/24 10:19 PM   Specimen: BLOOD LEFT ARM  Result Value Ref Range Status   Specimen Description BLOOD LEFT ARM  Final  Special Requests   Final    BOTTLES DRAWN AEROBIC AND ANAEROBIC Blood Culture adequate volume   Culture   Final    NO GROWTH 4 DAYS Performed at [facility], [address] [place], [place] [address]    Report Status PENDING  Incomplete    Radiology Studies: No results found.  Scheduled Meds:  Chlorhexidine Gluconate Cloth  6 each Topical Daily   clopidogrel  75 mg Oral Daily   enoxaparin (LOVENOX) injection  40 mg Subcutaneous Q24H   isosorbide mononitrate  60 mg Oral Daily   metoprolol succinate  75 mg Oral Daily   polyethylene glycol  17 g Oral Daily   sodium chloride flush  3 mL Intravenous Q12H   Continuous Infusions:   LOS: 5 days    Time spent: 35 mins    Willeen Niece, MD [facility]   If 7PM-7AM, please contact night-coverage

## 2024-01-08 NOTE — TOC Progression Note (Signed)
Transition of Care [facility] - Progression Note    Patient Details  Name: Robert [name] MRN: [id number] Date of Birth: 01-09-1941  Transition of Care [facility]) CM/SW Contact  Kermit Balo, RN Phone Number: 01/08/2024, 2:22 PM  Clinical Narrative:     Plan continues to be for home when medically ready. [facility] for [facility].  TOC following.  Expected Discharge Plan: Home w Home Health Services Barriers to Discharge: Continued Medical Work up  Expected Discharge Plan and Services                                               Social Determinants of Health (SDOH) Interventions SDOH Screenings   Food Insecurity: No Food Insecurity (01/03/2024)  Housing: Low Risk  (01/03/2024)  Transportation Needs: No Transportation Needs (01/03/2024)  Utilities: Not At Risk (01/03/2024)  Social Connections: Moderately Isolated (01/03/2024)  Tobacco Use: Low Risk  (01/02/2024)    Readmission Risk Interventions     No data to display

## 2024-01-08 NOTE — Plan of Care (Signed)
  Problem: Health Behavior/Discharge Planning: Goal: Ability to manage health-related needs will improve Outcome: Progressing   Problem: Clinical Measurements: Goal: Ability to maintain clinical measurements within normal limits will improve Outcome: Progressing   Problem: Clinical Measurements: Goal: Will remain free from infection Outcome: Progressing   Problem: Clinical Measurements: Goal: Cardiovascular complication will be avoided Outcome: Progressing   Problem: Activity: Goal: Risk for activity intolerance will decrease Outcome: Progressing   Problem: Nutrition: Goal: Adequate nutrition will be maintained Outcome: Progressing   Problem: Coping: Goal: Level of anxiety will decrease Outcome: Progressing   Problem: Pain Managment: Goal: General experience of comfort will improve and/or be controlled Outcome: Progressing   Problem: Safety: Goal: Ability to remain free from injury will improve Outcome: Progressing   Problem: Skin Integrity: Goal: Risk for impaired skin integrity will decrease Outcome: Progressing

## 2024-01-08 NOTE — Progress Notes (Signed)
Mobility Specialist Progress Note:    01/08/24 0900  Mobility  Activity Ambulated with assistance in room;Transferred from bed to chair  Level of Assistance Contact guard assist, steadying assist  Assistive Device Front wheel walker  Distance Ambulated (ft) 40 ft  Activity Response Tolerated well  Mobility Referral Yes  Mobility visit 1 Mobility  Mobility Specialist Start Time (ACUTE ONLY) [id number]  Mobility Specialist Stop Time (ACUTE ONLY) 0839  Mobility Specialist Time Calculation (min) (ACUTE ONLY) 13 min   Pt received in bed and agreeable. C/o BLE pain. Able to stand w/ supervision and ambulated in room to chair w/ CG. No complaints throughout ambulation. Pt left in chair with call bell and all needs met. Chair alarm on.  D'Vante Earlene Plater Mobility Specialist Please contact via Special educational needs teacher or Rehab office at [phone]

## 2024-01-09 ENCOUNTER — Other Ambulatory Visit: Payer: Self-pay

## 2024-01-09 DIAGNOSIS — R6510 Systemic inflammatory response syndrome (SIRS) of non-infectious origin without acute organ dysfunction: Secondary | ICD-10-CM | POA: Diagnosis not present

## 2024-01-09 LAB — CBC
HCT: 42.6 % (ref 39.0–52.0)
Hemoglobin: 14.2 g/dL (ref 13.0–17.0)
MCH: 30.7 pg (ref 26.0–34.0)
MCHC: 33.3 g/dL (ref 30.0–36.0)
MCV: 92 fL (ref 80.0–100.0)
Platelets: 409 10*3/uL — ABNORMAL HIGH (ref 150–400)
RBC: 4.63 MIL/uL (ref 4.22–5.81)
RDW: 12.8 % (ref 11.5–15.5)
WBC: 9 10*3/uL (ref 4.0–10.5)
nRBC: 0 % (ref 0.0–0.2)

## 2024-01-09 LAB — CULTURE, BLOOD (ROUTINE X 2)
Culture: NO GROWTH
Culture: NO GROWTH
Special Requests: ADEQUATE
Special Requests: ADEQUATE

## 2024-01-09 LAB — BASIC METABOLIC PANEL
Anion gap: 8 (ref 5–15)
BUN: 9 mg/dL (ref 8–23)
CO2: 25 mmol/L (ref 22–32)
Calcium: 8.6 mg/dL — ABNORMAL LOW (ref 8.9–10.3)
Chloride: 106 mmol/L (ref 98–111)
Creatinine, Ser: 0.78 mg/dL (ref 0.61–1.24)
GFR, Estimated: >60 mL/min (ref >60)
Glucose, Bld: 104 mg/dL — ABNORMAL HIGH (ref 70–99)
Potassium: 4.2 mmol/L (ref 3.5–5.1)
Sodium: 139 mmol/L (ref 135–145)

## 2024-01-09 MED ORDER — ATORVASTATIN CALCIUM 80 MG PO TABS: 80.0000 mg | ORAL_TABLET | Freq: Every day | ORAL | 0 refills | Status: DC

## 2024-01-09 NOTE — Discharge Instructions (Signed)
Advised to hold amlodipine, and other blood pressure medication until blood pressure improves. Home health services been arranged. Advised to follow-up with urology for outpatient voiding trial. Patient being discharged with Foley catheter.

## 2024-01-09 NOTE — Discharge Summary (Signed)
Physician Discharge Summary  Robert [name] JXB:[id number] DOB: 14-Nov-1941 DOA: 01/02/2024  PCP: Georgann Housekeeper, MD  Admit date: 01/02/2024  Discharge date: 01/09/2024  Admitted From: Home  Disposition:  Home Health Services  Recommendations for Outpatient Follow-up:  Follow up with PCP in 1-2 weeks. Please obtain BMP/CBC in one week. Advised to hold amlodipine, and other blood pressure medications until blood pressure improves. Home health services been arranged. Advised to follow-up with Urology for outpatient voiding trial. Patient being discharged with Foley catheter.  Home Health: Home PT/OT Equipment/Devices: Foley catheter  Discharge Condition: Stable CODE STATUS:Full code Diet recommendation: Heart Healthy   Brief [facility] Course: This 83 yrs old Male with PMH significant for hypertension, hyperlipidemia, right subclavian artery stenosis status post stenting in 2016, CAD with CABG in 2008 who presents with generalized aches and malaise.  Patient has developed right shoulder pain a week ago and then it spread to involve the neck , left shoulder , hip and lower extremities.  Labs notable for WBC 14.7, Hb 17.2, lactic acid 2.5, normal CK, UA no bacteriuria or pyuria.  Chest x-ray no acute findings, CT A&P unremarkable.  Patient was given a dose of IV Rocephin,  started on IV fluids and admitted for further evaluation with a concern for polymyalgia rheumatica.  Patient has made significant improvement,  participated with physical therapy,  initially recommended skilled nursing facility.  Patient has developed acute kidney injury , could be due to urinary retention,  renal function significantly improved after Foley insertion.  Patient felt much better and wants to be discharged, home health services arranged.  Patient will follow-up with urology outpatient for outpatient voiding trial.  Patient is being discharged home.   Discharge Diagnoses:  Principal Problem:   SIRS  (systemic inflammatory response syndrome) (HCC) Active Problems:   CAD (coronary artery disease)   Essential (primary) hypertension   Polymyalgia rheumatica (HCC)  SIRS: Generalized weakness/ Aches/ Malaise: No signs of infection.  Blood cultures are negative.  CK level wnl.  RSV, COVID, flu negative, blood cultures no growth so far.  CRP elevated at 16.5. TSH wnl Anti CCP ordered and pending.  Pt reports all his symptoms of body aches and weakness have improved.    AKI suspect from urinary retention: Baseline creatinine around 0.9, up trended creatinine to 2.34.  US showed Distended urinary bladder which patient could not empty. Mild hydronephrosis since 01/02/2024 is presumably related. Started on IV fluids and foley catheter placed. Repeat renal parameters have improved.  He might need to be discharged to SNF with foley and follow up with Urology as outpatient for voiding trial.    Hypokalemia: Replaced, continue to monitor   Hypertension:  Well controlled. Continue with metoprolol and imdur.    Elevated lactic acid: Normalized.      Estimated body mass index is 24.35 kg/m as calculated from the following:   Height as of this encounter: 5\' 9"  (1.753 m).   Weight as of this encounter: 74.8 kg.  Discharge Instructions  Discharge Instructions     Call MD for:  difficulty breathing, headache or visual disturbances   Complete by: As directed    Call MD for:  persistant dizziness or light-headedness   Complete by: As directed    Call MD for:  persistant nausea and vomiting   Complete by: As directed    Diet - low sodium heart healthy   Complete by: As directed    Diet Carb Modified   Complete by: As directed  Discharge instructions   Complete by: As directed    Advised to follow-up with primary care physician in 1 week. Advised to hold amlodipine, and other blood pressure medication until blood pressure improves. Home health services been arranged. Advised to  follow-up with urology for outpatient voiding trial. Patient being discharged with Foley catheter.   Increase activity slowly   Complete by: As directed       Allergies as of 01/09/2024   No Known Allergies      Medication List     STOP taking these medications    amLODipine 10 MG tablet Commonly known as: NORVASC   hydrochlorothiazide 25 MG tablet Commonly known as: HYDRODIURIL   ramipril 10 MG capsule Commonly known as: ALTACE       TAKE these medications    atorvastatin 80 MG tablet Commonly known as: LIPITOR Take 1 tablet by mouth every day at bedtime   clopidogrel 75 MG tablet Commonly known as: PLAVIX TAKE 1 TABLET BY MOUTH DAILY   isosorbide mononitrate 60 MG 24 hr tablet Commonly known as: IMDUR TAKE 1 TABLET BY MOUTH EVERY DAY   metoprolol succinate 50 MG 24 hr tablet Commonly known as: TOPROL-XL Take 1.5 tablets (75 mg total) by mouth daily.   nitroGLYCERIN 0.4 MG SL tablet Commonly known as: NITROSTAT Place 0.4 mg under the tongue every 5 (five) minutes as needed for chest pain.        Follow-up Information     [facility] [place], [place] Follow up.   Why: Lonni Fix  [phone] for PT OT RN and Social work they will call you to set up services Contact information: [address] [place] [address] [phone]         Georgann Housekeeper, MD Follow up in 1 week(s).   Specialty: Internal Medicine Contact information: [address] [place] [address] [phone]         [facility] Follow up in 1 week(s).   Contact information: [address] [place] [address] [phone]               No Known Allergies  Consultations: None   Procedures/Studies: US RENAL Result Date: 01/06/2024 CLINICAL DATA:  Acute kidney injury EXAM: RENAL / URINARY TRACT ULTRASOUND COMPLETE COMPARISON:  Abdominal CT from 4 days prior FINDINGS: Right Kidney: Renal  measurements: 12 x 7 x 7 cm = volume: 310 mL. Mild hydronephrosis. No focal cortical finding or abnormal echogenicity. Left Kidney: Renal measurements: 11.5 x 6.5 x 6 cm = volume: 230 mL. Mild pelviectasis. No focal cortical finding Bladder: Distended bladder, volume not calculated, without focal abnormality or debris. Reportedly the patient was unable to void. IMPRESSION: Distended urinary bladder which patient could not empty. Mild hydronephrosis since 01/02/2024 is presumably related. Electronically Signed   By: Tiburcio Pea M.D.   On: 01/06/2024 06:49   CT ABDOMEN PELVIS W CONTRAST Result Date: 01/02/2024 CLINICAL DATA:  Nonlocalized abdomen pain EXAM: CT ABDOMEN AND PELVIS WITH CONTRAST TECHNIQUE: Multidetector CT imaging of the abdomen and pelvis was performed using the standard protocol following bolus administration of intravenous contrast. RADIATION DOSE REDUCTION: This exam was performed according to the departmental dose-optimization program which includes automated exposure control, adjustment of the mA and/or kV according to patient size and/or use of iterative reconstruction technique. CONTRAST:  75mL OMNIPAQUE IOHEXOL 350 MG/ML SOLN COMPARISON:  None Available. FINDINGS: Lower chest: Lung bases demonstrate mild dependent atelectasis. No consolidation  or effusion. Coronary vascular calcification. Hepatobiliary: No calcified gallstone or biliary dilatation Pancreas: Unremarkable. No pancreatic ductal dilatation or surrounding inflammatory changes. Spleen: Normal in size without focal abnormality. Adrenals/Urinary Tract: Adrenal glands are normal. Kidneys show no hydronephrosis. Subcentimeter hypodensities for which no imaging follow-up is recommended. Bladder is normal Stomach/Bowel: Stomach is within normal limits. Appendix appears normal. No evidence of bowel wall thickening, distention, or inflammatory changes. Diverticular disease of the colon. Vascular/Lymphatic: Severe aortic atherosclerosis  with moderate mural thickening at the distal infrarenal abdominal aorta and iliac bifurcation. No aneurysm. No suspicious lymph nodes Reproductive: Enlarged prostate with probable TURP defect Other: Negative for pelvic effusion or free air Musculoskeletal: No acute or suspicious osseous abnormality IMPRESSION: 1. No CT evidence for acute intra-abdominal or pelvic abnormality. 2. Diverticular disease of the colon without acute inflammatory process. 3. Aortic atherosclerosis. Aortic Atherosclerosis (ICD10-I70.0). Electronically Signed   By: Jasmine Pang M.D.   On: 01/02/2024 17:02   DG Chest 1 View Result Date: 01/02/2024 CLINICAL DATA:  Cough EXAM: CHEST  1 VIEW COMPARISON:  X-ray 08/03/2018 FINDINGS: Elevation of the left hemidiaphragm. Mild basilar atelectasis bilaterally. No pneumothorax, edema or consolidation. Normal cardiopericardial silhouette with a calcified aorta. Sternal wires. IMPRESSION: Postop chest.  Elevated left hemidiaphragm.  Basilar atelectasis. Electronically Signed   By: Karen Kays M.D.   On: 01/02/2024 12:51    Subjective: Patient was seen and examined at bedside.  Overnight events noted.   Patient reports doing much better.  Patient is being discharged with Foley catheter and will follow-up with urology outpatient for voiding trial.  Discharge Exam: Vitals:   01/09/24 0829 01/09/24 0832  BP: 133/65 133/65  Pulse: 87 83  Resp: 16   Temp: (!) 97.5 F (36.4 C) (!) 97.5 F (36.4 C)  SpO2: 97% 96%   Vitals:   01/08/24 2110 01/09/24 0539 01/09/24 0829 01/09/24 0832  BP: 122/70 139/77 133/65 133/65  Pulse: 88 79 87 83  Resp: 20 20 16    Temp: 98.6 F (37 C) 98 F (36.7 C) (!) 97.5 F (36.4 C) (!) 97.5 F (36.4 C)  TempSrc: Oral Oral Oral Oral  SpO2: 95% 95% 97% 96%  Weight:      Height:        General: Pt is alert, awake, not in acute distress Cardiovascular: RRR, S1/S2 +, no rubs, no gallops Respiratory: CTA bilaterally, no wheezing, no rhonchi Abdominal:  Soft, NT, ND, bowel sounds + Extremities: no edema, no cyanosis    The results of significant diagnostics from this hospitalization (including imaging, microbiology, ancillary and laboratory) are listed below for reference.     Microbiology: Recent Results (from the past 240 hours)  Resp panel by RT-PCR (RSV, Flu A&B, Covid) Anterior Nasal Swab     Status: None   Collection Time: 01/02/24 11:46 AM   Specimen: Anterior Nasal Swab  Result Value Ref Range Status   SARS Coronavirus 2 by RT PCR NEGATIVE NEGATIVE Final   Influenza A by PCR NEGATIVE NEGATIVE Final   Influenza B by PCR NEGATIVE NEGATIVE Final    Comment: (NOTE) The Xpert Xpress SARS-CoV-2/FLU/RSV plus assay is intended as an aid in the diagnosis of influenza from Nasopharyngeal swab specimens and should not be used as a sole basis for treatment. Nasal washings and aspirates are unacceptable for Xpert Xpress SARS-CoV-2/FLU/RSV testing.  Fact Sheet for Patients: BloggerCourse.com  Fact Sheet for Healthcare Providers: SeriousBroker.it  This test is not yet approved or cleared by the [place] FDA and has  been authorized for detection and/or diagnosis of SARS-CoV-2 by FDA under an Emergency Use Authorization (EUA). This EUA will remain in effect (meaning this test can be used) for the duration of the COVID-19 declaration under Section 564(b)(1) of the Act, 21 U.S.C. section 360bbb-3(b)(1), unless the authorization is terminated or revoked.     Resp Syncytial Virus by PCR NEGATIVE NEGATIVE Final    Comment: (NOTE) Fact Sheet for Patients: BloggerCourse.com  Fact Sheet for Healthcare Providers: SeriousBroker.it  This test is not yet approved or cleared by the [place] FDA and has been authorized for detection and/or diagnosis of SARS-CoV-2 by FDA under an Emergency Use Authorization (EUA). This EUA will  remain in effect (meaning this test can be used) for the duration of the COVID-19 declaration under Section 564(b)(1) of the Act, 21 U.S.C. section 360bbb-3(b)(1), unless the authorization is terminated or revoked.  Performed at [facility], [address], [place], [place] [address]   Blood culture (routine x 2)     Status: None   Collection Time: 01/02/24 12:03 PM   Specimen: BLOOD  Result Value Ref Range Status   Specimen Description BLOOD LEFT ANTECUBITAL  Final   Special Requests   Final    BOTTLES DRAWN AEROBIC AND ANAEROBIC Blood Culture adequate volume   Culture   Final    NO GROWTH 5 DAYS Performed at [facility], [address], [place], [place] [address]    Report Status 01/07/2024 FINAL  Final  Blood culture (routine x 2)     Status: None   Collection Time: 01/02/24  1:33 PM   Specimen: BLOOD  Result Value Ref Range Status   Specimen Description BLOOD RIGHT ANTECUBITAL  Final   Special Requests   Final    BOTTLES DRAWN AEROBIC AND ANAEROBIC Blood Culture results may not be optimal due to an inadequate volume of blood received in culture bottles   Culture   Final    NO GROWTH 5 DAYS Performed at [facility], [address], [place], [place] [address]    Report Status 01/07/2024 FINAL  Final  Culture, blood (Routine X 2) w Reflex to ID Panel     Status: None   Collection Time: 01/04/24 10:14 PM   Specimen: BLOOD RIGHT ARM  Result Value Ref Range Status   Specimen Description BLOOD RIGHT ARM  Final   Special Requests   Final    BOTTLES DRAWN AEROBIC AND ANAEROBIC Blood Culture adequate volume   Culture   Final    NO GROWTH 5 DAYS Performed at [facility], [address], [place], [place] [address]    Report Status 01/09/2024 FINAL  Final  Culture, blood (Routine X 2) w Reflex to ID Panel     Status: None   Collection Time: 01/04/24 10:19 PM   Specimen: BLOOD LEFT ARM  Result Value Ref Range Status   Specimen Description BLOOD LEFT  ARM  Final   Special Requests   Final    BOTTLES DRAWN AEROBIC AND ANAEROBIC Blood Culture adequate volume   Culture   Final    NO GROWTH 5 DAYS Performed at [facility], [address], [place], [place] [address]    Report Status 01/09/2024 FINAL  Final     Labs: BNP (last 3 results) No results for input(s): "BNP" in the last 8760 hours. Basic Metabolic Panel: Recent Labs  Lab 01/04/24 0314 01/05/24 0725 01/06/24 0317 01/07/24 0734 01/08/24 0102 01/09/24  0316  NA 135 136 136 137 137 139  K 3.3* 3.6 3.8 4.0 3.8 4.2  CL 101 102 103 105 105 106  CO2 25 25 23 24 22 25   GLUCOSE 108* 112* 104* 91 135* 104*  BUN 13 24* 29* 11 10 9   CREATININE 0.98 2.34* 2.10* 0.88 0.76 0.78  CALCIUM 8.4* 8.6* 8.8* 8.5* 8.3* 8.6*  MG 2.1  --  2.4  --   --   --   PHOS 2.6  --  3.6  --   --   --    Liver Function Tests: Recent Labs  Lab 01/02/24 1319 01/03/24 0519 01/04/24 0314 01/05/24 0725  AST 46* 38 51* 70*  ALT 52* 39 41 62*  ALKPHOS 76 62 55 71  BILITOT 1.6* 1.5* 1.4* 1.0  PROT 7.8 6.2* 6.0* 6.3*  ALBUMIN 3.3* 2.6* 2.3* 2.3*   No results for input(s): "LIPASE", "AMYLASE" in the last 168 hours. No results for input(s): "AMMONIA" in the last 168 hours. CBC: Recent Labs  Lab 01/02/24 1319 01/03/24 0519 01/04/24 0314 01/05/24 0725 01/06/24 0317 01/09/24 0316  WBC 14.7* 11.2* 12.4* 11.8* 11.1* 9.0  NEUTROABS 10.4*  --   --   --   --   --   HGB 17.2* 15.2 14.0 14.2 14.2 14.2  HCT 50.4 44.7 41.9 42.4 41.7 42.6  MCV 91.3 91.8 91.9 92.2 90.7 92.0  PLT 351 283 287 333 342 409*   Cardiac Enzymes: Recent Labs  Lab 01/02/24 1319  CKTOTAL 192   BNP: Invalid input(s): "POCBNP" CBG: No results for input(s): "GLUCAP" in the last 168 hours. D-Dimer No results for input(s): "DDIMER" in the last 72 hours. Hgb A1c No results for input(s): "HGBA1C" in the last 72 hours. Lipid Profile No results for input(s): "CHOL", "HDL", "LDLCALC", "TRIG", "CHOLHDL", "LDLDIRECT" in the  last 72 hours. Thyroid function studies No results for input(s): "TSH", "T4TOTAL", "T3FREE", "THYROIDAB" in the last 72 hours.  Invalid input(s): "FREET3" Anemia work up No results for input(s): "VITAMINB12", "FOLATE", "FERRITIN", "TIBC", "IRON", "RETICCTPCT" in the last 72 hours. Urinalysis    Component Value Date/Time   COLORURINE AMBER (A) 01/02/2024 1600   APPEARANCEUR HAZY (A) 01/02/2024 1600   LABSPEC 1.021 01/02/2024 1600   PHURINE 5.0 01/02/2024 1600   GLUCOSEU NEGATIVE 01/02/2024 1600   HGBUR NEGATIVE 01/02/2024 1600   BILIRUBINUR NEGATIVE 01/02/2024 1600   KETONESUR NEGATIVE 01/02/2024 1600   PROTEINUR 30 (A) 01/02/2024 1600   NITRITE NEGATIVE 01/02/2024 1600   LEUKOCYTESUR NEGATIVE 01/02/2024 1600   Sepsis Labs Recent Labs  Lab 01/04/24 0314 01/05/24 0725 01/06/24 0317 01/09/24 0316  WBC 12.4* 11.8* 11.1* 9.0   Microbiology Recent Results (from the past 240 hours)  Resp panel by RT-PCR (RSV, Flu A&B, Covid) Anterior Nasal Swab     Status: None   Collection Time: 01/02/24 11:46 AM   Specimen: Anterior Nasal Swab  Result Value Ref Range Status   SARS Coronavirus 2 by RT PCR NEGATIVE NEGATIVE Final   Influenza A by PCR NEGATIVE NEGATIVE Final   Influenza B by PCR NEGATIVE NEGATIVE Final    Comment: (NOTE) The Xpert Xpress SARS-CoV-2/FLU/RSV plus assay is intended as an aid in the diagnosis of influenza from Nasopharyngeal swab specimens and should not be used as a sole basis for treatment. Nasal washings and aspirates are unacceptable for Xpert Xpress SARS-CoV-2/FLU/RSV testing.  Fact Sheet for Patients: BloggerCourse.com  Fact Sheet for Healthcare Providers: SeriousBroker.it  This test is not yet approved or cleared  by the [place] and has been authorized for detection and/or diagnosis of SARS-CoV-2 by FDA under an Emergency Use Authorization (EUA). This EUA will remain in effect (meaning this  test can be used) for the duration of the COVID-19 declaration under Section 564(b)(1) of the Act, 21 U.S.C. section 360bbb-3(b)(1), unless the authorization is terminated or revoked.     Resp Syncytial Virus by PCR NEGATIVE NEGATIVE Final    Comment: (NOTE) Fact Sheet for Patients: BloggerCourse.com  Fact Sheet for Healthcare Providers: SeriousBroker.it  This test is not yet approved or cleared by the [place] FDA and has been authorized for detection and/or diagnosis of SARS-CoV-2 by FDA under an Emergency Use Authorization (EUA). This EUA will remain in effect (meaning this test can be used) for the duration of the COVID-19 declaration under Section 564(b)(1) of the Act, 21 U.S.C. section 360bbb-3(b)(1), unless the authorization is terminated or revoked.  Performed at [facility], [address], [place], [place] [address]   Blood culture (routine x 2)     Status: None   Collection Time: 01/02/24 12:03 PM   Specimen: BLOOD  Result Value Ref Range Status   Specimen Description BLOOD LEFT ANTECUBITAL  Final   Special Requests   Final    BOTTLES DRAWN AEROBIC AND ANAEROBIC Blood Culture adequate volume   Culture   Final    NO GROWTH 5 DAYS Performed at [facility], [address], [place], [place] [address]    Report Status 01/07/2024 FINAL  Final  Blood culture (routine x 2)     Status: None   Collection Time: 01/02/24  1:33 PM   Specimen: BLOOD  Result Value Ref Range Status   Specimen Description BLOOD RIGHT ANTECUBITAL  Final   Special Requests   Final    BOTTLES DRAWN AEROBIC AND ANAEROBIC Blood Culture results may not be optimal due to an inadequate volume of blood received in culture bottles   Culture   Final    NO GROWTH 5 DAYS Performed at [facility], [address], [place], [place] [address]    Report Status 01/07/2024 FINAL  Final  Culture, blood (Routine X 2) w Reflex to ID Panel      Status: None   Collection Time: 01/04/24 10:14 PM   Specimen: BLOOD RIGHT ARM  Result Value Ref Range Status   Specimen Description BLOOD RIGHT ARM  Final   Special Requests   Final    BOTTLES DRAWN AEROBIC AND ANAEROBIC Blood Culture adequate volume   Culture   Final    NO GROWTH 5 DAYS Performed at [facility], [address], [place], [place] [address]    Report Status 01/09/2024 FINAL  Final  Culture, blood (Routine X 2) w Reflex to ID Panel     Status: None   Collection Time: 01/04/24 10:19 PM   Specimen: BLOOD LEFT ARM  Result Value Ref Range Status   Specimen Description BLOOD LEFT ARM  Final   Special Requests   Final    BOTTLES DRAWN AEROBIC AND ANAEROBIC Blood Culture adequate volume   Culture   Final    NO GROWTH 5 DAYS Performed at [facility], [address] [place], [place] [address]    Report Status 01/09/2024 FINAL  Final     Time coordinating discharge: Over 30 minutes  SIGNED:   Willeen Niece, MD  [facility] 01/09/2024, 11:56 AM Pager   If 7PM-7AM, please contact  night-coverage

## 2024-01-09 NOTE — Progress Notes (Signed)
Discharge instructions given to pt/family. All belongings are with pt. All questions/concerns addressed.

## 2024-01-09 NOTE — TOC Transition Note (Signed)
Transition of Care [facility]) - Discharge Note   Patient Details  Name: Robert [name] MRN: [id number] Date of Birth: November 23, 1941  [facility]) CM/SW Contact:  Lawerance Sabal, RN Phone Number: 01/09/2024, 11:56 AM   Clinical Narrative:     Patient will DC to home.  Has DME RW and WC.  HH set up through [facility], liaison notified of DC.  Son to transport   Final next level of care: Home w Home Health Services Barriers to Discharge: No Barriers Identified   Patient Goals and CMS Choice Patient states their goals for this hospitalization and ongoing recovery are:: to go home CMS Medicare.gov Compare Post Acute Care list provided to:: Other (Comment Required) Choice offered to / list presented to : Adult Children      Discharge Placement                       Discharge Plan and Services Additional resources added to the After Visit Summary for                  DME Arranged: N/A           HH Agency: [facility] Date [facility] Agency Contacted: 01/09/24 Time HH Agency Contacted: 1156 Representative spoke with at [facility] Agency: Elnita Maxwell  Social Drivers of Health (SDOH) Interventions SDOH Screenings   Food Insecurity: No Food Insecurity (01/03/2024)  Housing: Low Risk  (01/03/2024)  Transportation Needs: No Transportation Needs (01/03/2024)  Utilities: Not At Risk (01/03/2024)  Social Connections: Moderately Isolated (01/03/2024)  Tobacco Use: Low Risk  (01/02/2024)     Readmission Risk Interventions     No data to display

## 2024-01-09 NOTE — Progress Notes (Signed)
Pt being discharged. Waiting for son to go over foley care teaching and bring clothes for pt.

## 2024-01-09 NOTE — Progress Notes (Signed)
Mobility Specialist Progress Note:    01/09/24 0900  Mobility  Activity Ambulated with assistance in room;Transferred from bed to chair  Level of Assistance Contact guard assist, steadying assist  Assistive Device Front wheel walker  Distance Ambulated (ft) 40 ft  Activity Response Tolerated well  Mobility Referral Yes  Mobility visit 1 Mobility  Mobility Specialist Start Time (ACUTE ONLY) [id number]  Mobility Specialist Stop Time (ACUTE ONLY) 0849  Mobility Specialist Time Calculation (min) (ACUTE ONLY) 10 min   Pt received in bed and agreeable. Able to stand w/ standby assist and ambulate to chair w/ CG. No complaints throughout. Pt left in chair with call bell and all needs met. Chair alarm on.  Robert [name] Mobility Specialist Please contact via Special educational needs teacher or Rehab office at [phone]

## 2024-01-09 NOTE — Progress Notes (Signed)
Physical Therapy Treatment Patient Details Name: Kelyn Demmons MRN: [id number] DOB: 1941/05/11 Today's Date: 01/09/2024   History of Present Illness 83 y.o. male presents to [facility] hospital on 01/02/2024 aches and malaise. Admitted for management of SIRS. Respiratory panel was negative. PMH includes HTN, HLD, CABG.    PT Comments  Continuing work on functional mobility and activity tolerance;  Session focused on progressive amb, and used a rollator in prep for dc home, because that is what he will use; education re: brakes and rollator use provided with good return demo; Overall progressing well; OK for dc home from PT standpoint    If plan is discharge home, recommend the following: A little help with walking and/or transfers;A lot of help with bathing/dressing/bathroom;Assistance with cooking/housework;Assist for transportation   Can travel by private vehicle     Yes  Equipment Recommendations  BSC/3in1    Recommendations for Other Services       Precautions / Restrictions Precautions Precautions: Fall Precaution Comments: poor endurance Restrictions Weight Bearing Restrictions Per Provider Order: No     Mobility  Bed Mobility Overal bed mobility: Needs Assistance Bed Mobility: Sit to Supine       Sit to supine: Supervision   General bed mobility comments: Lifted LEs into bed well    Transfers Overall transfer level: Needs assistance Equipment used: Rollator (4 wheels) Transfers: Sit to/from Stand Sit to Stand: Contact guard assist           General transfer comment: CGA for safety; noteworthy that pt did not need physical assist to rise; stood from recliner and from rollator seat    Ambulation/Gait Ambulation/Gait assistance: Contact guard assist Gait Distance (Feet): 160 Feet (80 x2; 1 seated rest break on Rolltor seat) Assistive device: Rollator (4 wheels) Gait Pattern/deviations: Step-through pattern, Decreased stride length, Trunk flexed       General  Gait Details: Occasional cues to keep rollator close, as well as to self-monitor for activity tolerance; good use of brakes for control   Stairs             Wheelchair Mobility     Tilt Bed    Modified Rankin (Stroke Patients Only)       Balance     Sitting balance-Leahy Scale: Good       Standing balance-Leahy Scale: Poor                              Cognition Arousal: Alert Behavior During Therapy: WFL for tasks assessed/performed Overall Cognitive Status: Within Functional Limits for tasks assessed                                          Exercises      General Comments General comments (skin integrity, edema, etc.): NAD on room air      Pertinent Vitals/Pain Pain Assessment Pain Assessment: Faces Faces Pain Scale: Hurts a little bit Pain Location: lower thoracic area, paraspinal muscle soreness Pain Descriptors / Indicators: Aching, Discomfort, Grimacing, Sore Pain Intervention(s): Monitored during session    Home Living                          Prior Function            PT Goals (current goals can now be found in the care plan  section) Acute Rehab PT Goals Patient Stated Goal: to return to independence PT Goal Formulation: With patient Time For Goal Achievement: 01/17/24 Potential to Achieve Goals: Good Progress towards PT goals: Progressing toward goals    Frequency    Min 1X/week      PT Plan      Co-evaluation              AM-PAC PT "6 Clicks" Mobility   Outcome Measure  Help needed turning from your back to your side while in a flat bed without using bedrails?: A Little Help needed moving from lying on your back to sitting on the side of a flat bed without using bedrails?: A Little Help needed moving to and from a bed to a chair (including a wheelchair)?: A Little Help needed standing up from a chair using your arms (e.g., wheelchair or bedside chair)?: A Little Help needed to  walk in hospital room?: A Little Help needed climbing 3-5 steps with a railing? : A Lot 6 Click Score: 17    End of Session Equipment Utilized During Treatment: Gait belt Activity Tolerance: Patient tolerated treatment well Patient left: in bed;with call bell/phone within reach Nurse Communication: Mobility status PT Visit Diagnosis: Other abnormalities of gait and mobility (R26.89);Muscle weakness (generalized) (M62.81)     Time: 4403-4742 PT Time Calculation (min) (ACUTE ONLY): 30 min  Charges:    $Gait Training: 8-22 mins $Therapeutic Activity: 8-22 mins PT General Charges $$ ACUTE PT VISIT: 1 Visit                     Van Clines, PT  Acute Rehabilitation Services Office [phone] Secure Chat welcomed    Levi Aland 01/09/2024, 11:56 AM

## 2024-01-11 DIAGNOSIS — I2510 Atherosclerotic heart disease of native coronary artery without angina pectoris: Secondary | ICD-10-CM | POA: Diagnosis not present

## 2024-01-11 DIAGNOSIS — E876 Hypokalemia: Secondary | ICD-10-CM | POA: Diagnosis not present

## 2024-01-11 DIAGNOSIS — M353 Polymyalgia rheumatica: Secondary | ICD-10-CM | POA: Diagnosis not present

## 2024-01-11 DIAGNOSIS — Z7902 Long term (current) use of antithrombotics/antiplatelets: Secondary | ICD-10-CM | POA: Diagnosis not present

## 2024-01-11 DIAGNOSIS — E785 Hyperlipidemia, unspecified: Secondary | ICD-10-CM | POA: Diagnosis not present

## 2024-01-11 DIAGNOSIS — I10 Essential (primary) hypertension: Secondary | ICD-10-CM | POA: Diagnosis not present

## 2024-01-11 DIAGNOSIS — R339 Retention of urine, unspecified: Secondary | ICD-10-CM | POA: Diagnosis not present

## 2024-01-11 DIAGNOSIS — Z466 Encounter for fitting and adjustment of urinary device: Secondary | ICD-10-CM | POA: Diagnosis not present

## 2024-01-11 DIAGNOSIS — Z951 Presence of aortocoronary bypass graft: Secondary | ICD-10-CM | POA: Diagnosis not present

## 2024-01-11 DIAGNOSIS — N179 Acute kidney failure, unspecified: Secondary | ICD-10-CM | POA: Diagnosis not present

## 2024-01-11 DIAGNOSIS — M6281 Muscle weakness (generalized): Secondary | ICD-10-CM | POA: Diagnosis not present

## 2024-01-15 DIAGNOSIS — I10 Essential (primary) hypertension: Secondary | ICD-10-CM | POA: Diagnosis not present

## 2024-01-15 DIAGNOSIS — R21 Rash and other nonspecific skin eruption: Secondary | ICD-10-CM | POA: Diagnosis not present

## 2024-01-15 DIAGNOSIS — Z0000 Encounter for general adult medical examination without abnormal findings: Secondary | ICD-10-CM | POA: Diagnosis not present

## 2024-01-15 DIAGNOSIS — N179 Acute kidney failure, unspecified: Secondary | ICD-10-CM | POA: Diagnosis not present

## 2024-01-15 DIAGNOSIS — N400 Enlarged prostate without lower urinary tract symptoms: Secondary | ICD-10-CM | POA: Diagnosis not present

## 2024-01-15 DIAGNOSIS — E7800 Pure hypercholesterolemia, unspecified: Secondary | ICD-10-CM | POA: Diagnosis not present

## 2024-01-15 DIAGNOSIS — I2510 Atherosclerotic heart disease of native coronary artery without angina pectoris: Secondary | ICD-10-CM | POA: Diagnosis not present

## 2024-01-15 DIAGNOSIS — Z1389 Encounter for screening for other disorder: Secondary | ICD-10-CM | POA: Diagnosis not present

## 2024-01-15 DIAGNOSIS — R339 Retention of urine, unspecified: Secondary | ICD-10-CM | POA: Diagnosis not present

## 2024-01-16 DIAGNOSIS — N179 Acute kidney failure, unspecified: Secondary | ICD-10-CM | POA: Diagnosis not present

## 2024-01-16 DIAGNOSIS — I2510 Atherosclerotic heart disease of native coronary artery without angina pectoris: Secondary | ICD-10-CM | POA: Diagnosis not present

## 2024-01-16 DIAGNOSIS — R339 Retention of urine, unspecified: Secondary | ICD-10-CM | POA: Diagnosis not present

## 2024-01-16 DIAGNOSIS — M6281 Muscle weakness (generalized): Secondary | ICD-10-CM | POA: Diagnosis not present

## 2024-01-16 DIAGNOSIS — I10 Essential (primary) hypertension: Secondary | ICD-10-CM | POA: Diagnosis not present

## 2024-01-16 DIAGNOSIS — M353 Polymyalgia rheumatica: Secondary | ICD-10-CM | POA: Diagnosis not present

## 2024-01-17 DIAGNOSIS — M6281 Muscle weakness (generalized): Secondary | ICD-10-CM | POA: Diagnosis not present

## 2024-01-17 DIAGNOSIS — N179 Acute kidney failure, unspecified: Secondary | ICD-10-CM | POA: Diagnosis not present

## 2024-01-17 DIAGNOSIS — M353 Polymyalgia rheumatica: Secondary | ICD-10-CM | POA: Diagnosis not present

## 2024-01-17 DIAGNOSIS — R339 Retention of urine, unspecified: Secondary | ICD-10-CM | POA: Diagnosis not present

## 2024-01-17 DIAGNOSIS — I10 Essential (primary) hypertension: Secondary | ICD-10-CM | POA: Diagnosis not present

## 2024-01-17 DIAGNOSIS — I2510 Atherosclerotic heart disease of native coronary artery without angina pectoris: Secondary | ICD-10-CM | POA: Diagnosis not present

## 2024-01-17 DIAGNOSIS — R338 Other retention of urine: Secondary | ICD-10-CM | POA: Diagnosis not present

## 2024-01-18 DIAGNOSIS — M353 Polymyalgia rheumatica: Secondary | ICD-10-CM | POA: Diagnosis not present

## 2024-01-18 DIAGNOSIS — N179 Acute kidney failure, unspecified: Secondary | ICD-10-CM | POA: Diagnosis not present

## 2024-01-18 DIAGNOSIS — I10 Essential (primary) hypertension: Secondary | ICD-10-CM | POA: Diagnosis not present

## 2024-01-18 DIAGNOSIS — R339 Retention of urine, unspecified: Secondary | ICD-10-CM | POA: Diagnosis not present

## 2024-01-18 DIAGNOSIS — I2510 Atherosclerotic heart disease of native coronary artery without angina pectoris: Secondary | ICD-10-CM | POA: Diagnosis not present

## 2024-01-18 DIAGNOSIS — M6281 Muscle weakness (generalized): Secondary | ICD-10-CM | POA: Diagnosis not present

## 2024-01-19 DIAGNOSIS — I2510 Atherosclerotic heart disease of native coronary artery without angina pectoris: Secondary | ICD-10-CM | POA: Diagnosis not present

## 2024-01-19 DIAGNOSIS — I10 Essential (primary) hypertension: Secondary | ICD-10-CM | POA: Diagnosis not present

## 2024-01-19 DIAGNOSIS — R339 Retention of urine, unspecified: Secondary | ICD-10-CM | POA: Diagnosis not present

## 2024-01-19 DIAGNOSIS — M6281 Muscle weakness (generalized): Secondary | ICD-10-CM | POA: Diagnosis not present

## 2024-01-19 DIAGNOSIS — M353 Polymyalgia rheumatica: Secondary | ICD-10-CM | POA: Diagnosis not present

## 2024-01-19 DIAGNOSIS — N179 Acute kidney failure, unspecified: Secondary | ICD-10-CM | POA: Diagnosis not present

## 2024-01-22 ENCOUNTER — Telehealth: Payer: Self-pay | Admitting: Cardiovascular Disease

## 2024-01-22 DIAGNOSIS — I499 Cardiac arrhythmia, unspecified: Secondary | ICD-10-CM | POA: Diagnosis not present

## 2024-01-22 DIAGNOSIS — I10 Essential (primary) hypertension: Secondary | ICD-10-CM | POA: Diagnosis not present

## 2024-01-22 DIAGNOSIS — R000 Tachycardia, unspecified: Secondary | ICD-10-CM | POA: Diagnosis not present

## 2024-01-22 DIAGNOSIS — R21 Rash and other nonspecific skin eruption: Secondary | ICD-10-CM | POA: Diagnosis not present

## 2024-01-22 NOTE — Telephone Encounter (Signed)
Called patient.  He had been taking Toprol 75 mg BID for "years"   during the hospitalization and on dc he was to only take 75 mg daily.  He was told this morning that the EKG showed early beats, PVCs.   He checks BP at home and has gotten readings 120s-150s/  I scheduled his one year follow up for March and adv that when Dr. Clifton James provides further recommendations we will call him back.  Pt feeling well right now and is in agreement w plan.

## 2024-01-22 NOTE — Telephone Encounter (Signed)
Spoke with patient and he states that this morning his heart was racing, felt like he was having palpitations and it woke him at 5 am. He called EMT. He does have copy of his EKG. His BP was 152/96 and his HR 86.   They advised for him to rest. Denies any chest pain, SOB, headache, swelling, nausea or vomiting.   He took meds at 8am. He is feeling better. He also wanted to let you know that he was in the ED on 1/14 and they cut his metoprolol to once a day. Did inform patient he can take is metoprolol earlier than his usual time if his HR was elevated and he felt like he was having palpitations.   He will continue to monitor his HR and BP. ED precautions discussed.

## 2024-01-22 NOTE — Telephone Encounter (Signed)
STAT if HR is under 50 or over 120 (normal HR is 60-100 beats per minute)  What is your heart rate?    Do you have a log of your heart rate readings (document readings)?  2/02: 152/96 86 blood sugar 143   Do you have any other symptoms?  High BP. Called EMT last night and they recommended checking for heart attack.

## 2024-01-23 DIAGNOSIS — I10 Essential (primary) hypertension: Secondary | ICD-10-CM | POA: Diagnosis not present

## 2024-01-23 DIAGNOSIS — I2510 Atherosclerotic heart disease of native coronary artery without angina pectoris: Secondary | ICD-10-CM | POA: Diagnosis not present

## 2024-01-23 DIAGNOSIS — N179 Acute kidney failure, unspecified: Secondary | ICD-10-CM | POA: Diagnosis not present

## 2024-01-23 DIAGNOSIS — R339 Retention of urine, unspecified: Secondary | ICD-10-CM | POA: Diagnosis not present

## 2024-01-23 DIAGNOSIS — M6281 Muscle weakness (generalized): Secondary | ICD-10-CM | POA: Diagnosis not present

## 2024-01-23 DIAGNOSIS — M353 Polymyalgia rheumatica: Secondary | ICD-10-CM | POA: Diagnosis not present

## 2024-01-23 MED ORDER — METOPROLOL SUCCINATE ER 50 MG PO TB24: 75.0000 mg | ORAL_TABLET | Freq: Two times a day (BID) | ORAL | 3 refills | Status: DC

## 2024-01-23 NOTE — Telephone Encounter (Signed)
 Per Dr. Verlin, okay for patient to resume his previous dose of Toprol  XL 75 mg BID.  Advised the patient he may start taking the 2nd dose each evening as he has done before the hospitalization.  Pt voices understanding and agreement.  Updated rx sent to pharmacy.

## 2024-01-25 DIAGNOSIS — R338 Other retention of urine: Secondary | ICD-10-CM | POA: Diagnosis not present

## 2024-01-26 DIAGNOSIS — N179 Acute kidney failure, unspecified: Secondary | ICD-10-CM | POA: Diagnosis not present

## 2024-01-26 DIAGNOSIS — I2510 Atherosclerotic heart disease of native coronary artery without angina pectoris: Secondary | ICD-10-CM | POA: Diagnosis not present

## 2024-01-26 DIAGNOSIS — I10 Essential (primary) hypertension: Secondary | ICD-10-CM | POA: Diagnosis not present

## 2024-01-26 DIAGNOSIS — M353 Polymyalgia rheumatica: Secondary | ICD-10-CM | POA: Diagnosis not present

## 2024-01-26 DIAGNOSIS — R339 Retention of urine, unspecified: Secondary | ICD-10-CM | POA: Diagnosis not present

## 2024-01-26 DIAGNOSIS — M6281 Muscle weakness (generalized): Secondary | ICD-10-CM | POA: Diagnosis not present

## 2024-01-30 DIAGNOSIS — R339 Retention of urine, unspecified: Secondary | ICD-10-CM | POA: Diagnosis not present

## 2024-01-30 DIAGNOSIS — I2510 Atherosclerotic heart disease of native coronary artery without angina pectoris: Secondary | ICD-10-CM | POA: Diagnosis not present

## 2024-01-30 DIAGNOSIS — I10 Essential (primary) hypertension: Secondary | ICD-10-CM | POA: Diagnosis not present

## 2024-01-30 DIAGNOSIS — M6281 Muscle weakness (generalized): Secondary | ICD-10-CM | POA: Diagnosis not present

## 2024-01-30 DIAGNOSIS — N179 Acute kidney failure, unspecified: Secondary | ICD-10-CM | POA: Diagnosis not present

## 2024-01-30 DIAGNOSIS — M353 Polymyalgia rheumatica: Secondary | ICD-10-CM | POA: Diagnosis not present

## 2024-01-31 DIAGNOSIS — I10 Essential (primary) hypertension: Secondary | ICD-10-CM | POA: Diagnosis not present

## 2024-01-31 DIAGNOSIS — I2510 Atherosclerotic heart disease of native coronary artery without angina pectoris: Secondary | ICD-10-CM | POA: Diagnosis not present

## 2024-01-31 DIAGNOSIS — M353 Polymyalgia rheumatica: Secondary | ICD-10-CM | POA: Diagnosis not present

## 2024-01-31 DIAGNOSIS — N179 Acute kidney failure, unspecified: Secondary | ICD-10-CM | POA: Diagnosis not present

## 2024-01-31 DIAGNOSIS — M6281 Muscle weakness (generalized): Secondary | ICD-10-CM | POA: Diagnosis not present

## 2024-01-31 DIAGNOSIS — R339 Retention of urine, unspecified: Secondary | ICD-10-CM | POA: Diagnosis not present

## 2024-02-01 DIAGNOSIS — I2510 Atherosclerotic heart disease of native coronary artery without angina pectoris: Secondary | ICD-10-CM | POA: Diagnosis not present

## 2024-02-01 DIAGNOSIS — M6281 Muscle weakness (generalized): Secondary | ICD-10-CM | POA: Diagnosis not present

## 2024-02-01 DIAGNOSIS — I10 Essential (primary) hypertension: Secondary | ICD-10-CM | POA: Diagnosis not present

## 2024-02-01 DIAGNOSIS — M353 Polymyalgia rheumatica: Secondary | ICD-10-CM | POA: Diagnosis not present

## 2024-02-01 DIAGNOSIS — R339 Retention of urine, unspecified: Secondary | ICD-10-CM | POA: Diagnosis not present

## 2024-02-01 DIAGNOSIS — N179 Acute kidney failure, unspecified: Secondary | ICD-10-CM | POA: Diagnosis not present

## 2024-02-05 DIAGNOSIS — N179 Acute kidney failure, unspecified: Secondary | ICD-10-CM | POA: Diagnosis not present

## 2024-02-05 DIAGNOSIS — M6281 Muscle weakness (generalized): Secondary | ICD-10-CM | POA: Diagnosis not present

## 2024-02-05 DIAGNOSIS — I10 Essential (primary) hypertension: Secondary | ICD-10-CM | POA: Diagnosis not present

## 2024-02-05 DIAGNOSIS — M353 Polymyalgia rheumatica: Secondary | ICD-10-CM | POA: Diagnosis not present

## 2024-02-05 DIAGNOSIS — I2510 Atherosclerotic heart disease of native coronary artery without angina pectoris: Secondary | ICD-10-CM | POA: Diagnosis not present

## 2024-02-05 DIAGNOSIS — R339 Retention of urine, unspecified: Secondary | ICD-10-CM | POA: Diagnosis not present

## 2024-02-06 DIAGNOSIS — N179 Acute kidney failure, unspecified: Secondary | ICD-10-CM | POA: Diagnosis not present

## 2024-02-06 DIAGNOSIS — I10 Essential (primary) hypertension: Secondary | ICD-10-CM | POA: Diagnosis not present

## 2024-02-06 DIAGNOSIS — I2510 Atherosclerotic heart disease of native coronary artery without angina pectoris: Secondary | ICD-10-CM | POA: Diagnosis not present

## 2024-02-06 DIAGNOSIS — R339 Retention of urine, unspecified: Secondary | ICD-10-CM | POA: Diagnosis not present

## 2024-02-06 DIAGNOSIS — M353 Polymyalgia rheumatica: Secondary | ICD-10-CM | POA: Diagnosis not present

## 2024-02-06 DIAGNOSIS — M6281 Muscle weakness (generalized): Secondary | ICD-10-CM | POA: Diagnosis not present

## 2024-02-07 DIAGNOSIS — M353 Polymyalgia rheumatica: Secondary | ICD-10-CM | POA: Diagnosis not present

## 2024-02-07 DIAGNOSIS — N179 Acute kidney failure, unspecified: Secondary | ICD-10-CM | POA: Diagnosis not present

## 2024-02-07 DIAGNOSIS — I2510 Atherosclerotic heart disease of native coronary artery without angina pectoris: Secondary | ICD-10-CM | POA: Diagnosis not present

## 2024-02-07 DIAGNOSIS — M6281 Muscle weakness (generalized): Secondary | ICD-10-CM | POA: Diagnosis not present

## 2024-02-07 DIAGNOSIS — R339 Retention of urine, unspecified: Secondary | ICD-10-CM | POA: Diagnosis not present

## 2024-02-07 DIAGNOSIS — I10 Essential (primary) hypertension: Secondary | ICD-10-CM | POA: Diagnosis not present

## 2024-02-10 DIAGNOSIS — M353 Polymyalgia rheumatica: Secondary | ICD-10-CM | POA: Diagnosis not present

## 2024-02-10 DIAGNOSIS — I10 Essential (primary) hypertension: Secondary | ICD-10-CM | POA: Diagnosis not present

## 2024-02-10 DIAGNOSIS — R339 Retention of urine, unspecified: Secondary | ICD-10-CM | POA: Diagnosis not present

## 2024-02-10 DIAGNOSIS — Z951 Presence of aortocoronary bypass graft: Secondary | ICD-10-CM | POA: Diagnosis not present

## 2024-02-10 DIAGNOSIS — E785 Hyperlipidemia, unspecified: Secondary | ICD-10-CM | POA: Diagnosis not present

## 2024-02-10 DIAGNOSIS — M6281 Muscle weakness (generalized): Secondary | ICD-10-CM | POA: Diagnosis not present

## 2024-02-10 DIAGNOSIS — Z7902 Long term (current) use of antithrombotics/antiplatelets: Secondary | ICD-10-CM | POA: Diagnosis not present

## 2024-02-10 DIAGNOSIS — N179 Acute kidney failure, unspecified: Secondary | ICD-10-CM | POA: Diagnosis not present

## 2024-02-10 DIAGNOSIS — E876 Hypokalemia: Secondary | ICD-10-CM | POA: Diagnosis not present

## 2024-02-10 DIAGNOSIS — Z466 Encounter for fitting and adjustment of urinary device: Secondary | ICD-10-CM | POA: Diagnosis not present

## 2024-02-10 DIAGNOSIS — I2510 Atherosclerotic heart disease of native coronary artery without angina pectoris: Secondary | ICD-10-CM | POA: Diagnosis not present

## 2024-02-12 DIAGNOSIS — I10 Essential (primary) hypertension: Secondary | ICD-10-CM | POA: Diagnosis not present

## 2024-02-12 DIAGNOSIS — N179 Acute kidney failure, unspecified: Secondary | ICD-10-CM | POA: Diagnosis not present

## 2024-02-12 DIAGNOSIS — I2510 Atherosclerotic heart disease of native coronary artery without angina pectoris: Secondary | ICD-10-CM | POA: Diagnosis not present

## 2024-02-12 DIAGNOSIS — M6281 Muscle weakness (generalized): Secondary | ICD-10-CM | POA: Diagnosis not present

## 2024-02-12 DIAGNOSIS — M353 Polymyalgia rheumatica: Secondary | ICD-10-CM | POA: Diagnosis not present

## 2024-02-12 DIAGNOSIS — R339 Retention of urine, unspecified: Secondary | ICD-10-CM | POA: Diagnosis not present

## 2024-02-13 DIAGNOSIS — M6281 Muscle weakness (generalized): Secondary | ICD-10-CM | POA: Diagnosis not present

## 2024-02-13 DIAGNOSIS — I10 Essential (primary) hypertension: Secondary | ICD-10-CM | POA: Diagnosis not present

## 2024-02-13 DIAGNOSIS — M353 Polymyalgia rheumatica: Secondary | ICD-10-CM | POA: Diagnosis not present

## 2024-02-13 DIAGNOSIS — R339 Retention of urine, unspecified: Secondary | ICD-10-CM | POA: Diagnosis not present

## 2024-02-13 DIAGNOSIS — N179 Acute kidney failure, unspecified: Secondary | ICD-10-CM | POA: Diagnosis not present

## 2024-02-13 DIAGNOSIS — I2510 Atherosclerotic heart disease of native coronary artery without angina pectoris: Secondary | ICD-10-CM | POA: Diagnosis not present

## 2024-02-15 DIAGNOSIS — D72829 Elevated white blood cell count, unspecified: Secondary | ICD-10-CM | POA: Diagnosis not present

## 2024-02-20 DIAGNOSIS — N179 Acute kidney failure, unspecified: Secondary | ICD-10-CM | POA: Diagnosis not present

## 2024-02-20 DIAGNOSIS — M6281 Muscle weakness (generalized): Secondary | ICD-10-CM | POA: Diagnosis not present

## 2024-02-20 DIAGNOSIS — M353 Polymyalgia rheumatica: Secondary | ICD-10-CM | POA: Diagnosis not present

## 2024-02-20 DIAGNOSIS — I10 Essential (primary) hypertension: Secondary | ICD-10-CM | POA: Diagnosis not present

## 2024-02-20 DIAGNOSIS — R339 Retention of urine, unspecified: Secondary | ICD-10-CM | POA: Diagnosis not present

## 2024-02-20 DIAGNOSIS — I2510 Atherosclerotic heart disease of native coronary artery without angina pectoris: Secondary | ICD-10-CM | POA: Diagnosis not present

## 2024-02-21 DIAGNOSIS — N401 Enlarged prostate with lower urinary tract symptoms: Secondary | ICD-10-CM | POA: Diagnosis not present

## 2024-02-21 DIAGNOSIS — R338 Other retention of urine: Secondary | ICD-10-CM | POA: Diagnosis not present

## 2024-02-23 ENCOUNTER — Other Ambulatory Visit: Payer: Self-pay | Admitting: Cardiovascular Disease

## 2024-02-25 DIAGNOSIS — S60222A Contusion of left hand, initial encounter: Secondary | ICD-10-CM | POA: Diagnosis not present

## 2024-02-27 DIAGNOSIS — I2510 Atherosclerotic heart disease of native coronary artery without angina pectoris: Secondary | ICD-10-CM | POA: Diagnosis not present

## 2024-02-27 DIAGNOSIS — N179 Acute kidney failure, unspecified: Secondary | ICD-10-CM | POA: Diagnosis not present

## 2024-02-27 DIAGNOSIS — R339 Retention of urine, unspecified: Secondary | ICD-10-CM | POA: Diagnosis not present

## 2024-02-27 DIAGNOSIS — M353 Polymyalgia rheumatica: Secondary | ICD-10-CM | POA: Diagnosis not present

## 2024-02-27 DIAGNOSIS — I10 Essential (primary) hypertension: Secondary | ICD-10-CM | POA: Diagnosis not present

## 2024-02-27 DIAGNOSIS — M6281 Muscle weakness (generalized): Secondary | ICD-10-CM | POA: Diagnosis not present

## 2024-02-28 ENCOUNTER — Telehealth: Payer: Self-pay | Admitting: Cardiovascular Disease

## 2024-02-28 NOTE — Telephone Encounter (Signed)
 Automated recall system. Left number to call back.

## 2024-02-28 NOTE — Telephone Encounter (Signed)
 Pharmacy called for a refill for medication that wasn't listed under current medication for the patient.

## 2024-03-05 DIAGNOSIS — I10 Essential (primary) hypertension: Secondary | ICD-10-CM | POA: Diagnosis not present

## 2024-03-05 DIAGNOSIS — I2510 Atherosclerotic heart disease of native coronary artery without angina pectoris: Secondary | ICD-10-CM | POA: Diagnosis not present

## 2024-03-05 DIAGNOSIS — M353 Polymyalgia rheumatica: Secondary | ICD-10-CM | POA: Diagnosis not present

## 2024-03-05 DIAGNOSIS — R339 Retention of urine, unspecified: Secondary | ICD-10-CM | POA: Diagnosis not present

## 2024-03-05 DIAGNOSIS — M6281 Muscle weakness (generalized): Secondary | ICD-10-CM | POA: Diagnosis not present

## 2024-03-05 DIAGNOSIS — N179 Acute kidney failure, unspecified: Secondary | ICD-10-CM | POA: Diagnosis not present

## 2024-03-05 NOTE — Telephone Encounter (Signed)
 Attempted to reach patient to clarify if he needed a medication refilled and if so, what it was. No answer, left detailed message asking he call us back if he needs a medication prior to his scheduled visit here on 03/18/24 w/[place]. Otherwise, will disregard pharmacy call.

## 2024-03-06 DIAGNOSIS — M353 Polymyalgia rheumatica: Secondary | ICD-10-CM | POA: Diagnosis not present

## 2024-03-06 DIAGNOSIS — R339 Retention of urine, unspecified: Secondary | ICD-10-CM | POA: Diagnosis not present

## 2024-03-06 DIAGNOSIS — N179 Acute kidney failure, unspecified: Secondary | ICD-10-CM | POA: Diagnosis not present

## 2024-03-06 DIAGNOSIS — I10 Essential (primary) hypertension: Secondary | ICD-10-CM | POA: Diagnosis not present

## 2024-03-06 DIAGNOSIS — M6281 Muscle weakness (generalized): Secondary | ICD-10-CM | POA: Diagnosis not present

## 2024-03-06 DIAGNOSIS — I2510 Atherosclerotic heart disease of native coronary artery without angina pectoris: Secondary | ICD-10-CM | POA: Diagnosis not present

## 2024-03-07 DIAGNOSIS — I2510 Atherosclerotic heart disease of native coronary artery without angina pectoris: Secondary | ICD-10-CM | POA: Diagnosis not present

## 2024-03-07 DIAGNOSIS — I10 Essential (primary) hypertension: Secondary | ICD-10-CM | POA: Diagnosis not present

## 2024-03-07 DIAGNOSIS — M6281 Muscle weakness (generalized): Secondary | ICD-10-CM | POA: Diagnosis not present

## 2024-03-07 DIAGNOSIS — N179 Acute kidney failure, unspecified: Secondary | ICD-10-CM | POA: Diagnosis not present

## 2024-03-07 DIAGNOSIS — M353 Polymyalgia rheumatica: Secondary | ICD-10-CM | POA: Diagnosis not present

## 2024-03-07 DIAGNOSIS — R339 Retention of urine, unspecified: Secondary | ICD-10-CM | POA: Diagnosis not present

## 2024-03-18 ENCOUNTER — Encounter: Payer: Self-pay | Admitting: Cardiovascular Disease

## 2024-03-18 ENCOUNTER — Ambulatory Visit: Payer: Medicare Other | Attending: Cardiovascular Disease | Admitting: Cardiovascular Disease

## 2024-03-18 VITALS — BP 158/92 | HR 80 | Ht 69.0 in | Wt 154.0 lb

## 2024-03-18 DIAGNOSIS — I10 Essential (primary) hypertension: Secondary | ICD-10-CM | POA: Diagnosis not present

## 2024-03-18 DIAGNOSIS — I493 Ventricular premature depolarization: Secondary | ICD-10-CM

## 2024-03-18 DIAGNOSIS — E7800 Pure hypercholesterolemia, unspecified: Secondary | ICD-10-CM

## 2024-03-18 DIAGNOSIS — I25118 Atherosclerotic heart disease of native coronary artery with other forms of angina pectoris: Secondary | ICD-10-CM

## 2024-03-18 DIAGNOSIS — I739 Peripheral vascular disease, unspecified: Secondary | ICD-10-CM

## 2024-03-18 NOTE — Progress Notes (Signed)
 Chief Complaint  Patient presents with   Follow-up    CAD   History of Present Illness: 83 yo male with history of CAD s/p CABG in 2008, HTN, HLD and subclavian artery stenosis who is here today for cardiac follow up. His CABG was in 2008 in [place]. Prior right subclavian artery stent in 2016. He underwent a nuclear stress in June 2019 which showed a fixed inferior defect and no ischemia. He began having chest pain in July 2019 and underwent a cardiac cath on 07/10/18 which showed 90% distal left main stenosis, occluded proximal LAD, 90% ostial Circumflex stenosis, 60% in stent restenosis RCA and severe distal RCA stenosis. LIMA to LAD patent. SVG to OM was patent. SVG to RCA and intermediate occluded. PCI of the RCA was performed with placement of 3 drug eluting stents. He then had staged PCI of the distal left main artery into the intermediate vessel with atherectomy and placement of 2 drug eluting stents. He was admitted to [facility] in August 2019. He ruled out for an MI. Imdur was increased and he was discharged home. He was seen in the office and started on Ranexa but he did not tolerate the Ranexa. Echo 08/10/18 with LVEF=60-65%. Grade 1 diastolic dysfunction. There was mild mitral regurgitation. He was admitted to [facility] in January 2025 with a viral syndrome and was in acute renal failure. Norvasc and hydrochlorothiazide were stopped.   He is here today for follow up. The patient denies any chest pain, dyspnea, palpitations, lower extremity edema, orthopnea, PND, dizziness, near syncope or syncope. BP has been up at home since his Norvasc and hydrochlorothiazide were stopped.   Primary Care Physician: Georgann Housekeeper, MD  Past Medical History:  Diagnosis Date   Arthritis    "a little in my fingers; never tx'd" (07/17/2018)   CAD (coronary artery disease)    a. CABG X4 2008. b. MI and stent (in [place]) b. PCI/DES to RCA (07/12/18) c. PCI/DES to LM-ramus (07/17/18)   Foot fracture, right 1996   High  cholesterol    Hypertension    Leg hematoma 01/2018   RLE; "no tx"   Presence of arterial stent 2016   Right subclavian artery   Skipped heart beats    Stenosis of subclavian artery (HCC)    a. s/p R subclavian stent 2016.    Past Surgical History:  Procedure Laterality Date   AORTIC ARCH ANGIOGRAPHY N/A 07/10/2018   Procedure: AORTIC ARCH ANGIOGRAPHY;  Surgeon: Marykay Lex, MD;  Location: [facility] INVASIVE CV LAB;  Service: Cardiovascular;  Laterality: N/A;   CATARACT EXTRACTION W/ INTRAOCULAR LENS  IMPLANT, BILATERAL Bilateral    CORONARY ANGIOGRAPHY N/A 07/18/2018   Procedure: CORONARY ANGIOGRAPHY;  Surgeon: Marykay Lex, MD;  Location: [facility] INVASIVE CV LAB;  Service: Cardiovascular;  Laterality: N/A;   CORONARY ANGIOPLASTY WITH STENT PLACEMENT  2016   CORONARY ARTERY BYPASS GRAFT  2008   "CABG X4"   CORONARY ATHERECTOMY N/A 07/18/2018   Procedure: CORONARY ATHERECTOMY;  Surgeon: Marykay Lex, MD;  Location: [facility] INVASIVE CV LAB;  Service: Cardiovascular;  Laterality: N/A;   CORONARY STENT INTERVENTION N/A 07/12/2018   Procedure: CORONARY STENT INTERVENTION;  Surgeon: Marykay Lex, MD;  Location: [facility] INVASIVE CV LAB;  Service: Cardiovascular;  Laterality: N/A;   INGUINAL HERNIA REPAIR Right 08/27/2020   Procedure: OPEN RIGHT INGUINAL HERNIA REPAIR WITH MESH;  Surgeon: Abigail Miyamoto, MD;  Location: [facility];  Service: General;  Laterality: Right;  LEFT HEART CATH AND CORS/GRAFTS ANGIOGRAPHY N/A 07/10/2018   Procedure: LEFT HEART CATH AND CORS/GRAFTS ANGIOGRAPHY;  Surgeon: Marykay Lex, MD;  Location: [facility] INVASIVE CV LAB;  Service: Cardiovascular;  Laterality: N/A;   PROSTATECTOMY  ~ 2012   "removed most of it; no cancer" (07/17/2018)   SUBCLAVIAN ARTERY STENT Right 2016    Current Outpatient Medications  Medication Sig Dispense Refill   atorvastatin (LIPITOR) 80 MG tablet Take 1 tablet (80 mg total) by mouth at bedtime. 90 tablet 0   clopidogrel (PLAVIX)  75 MG tablet TAKE 1 TABLET BY MOUTH DAILY 90 tablet 3   isosorbide mononitrate (IMDUR) 60 MG 24 hr tablet TAKE 1 TABLET BY MOUTH EVERY DAY 90 tablet 3   metoprolol succinate (TOPROL-XL) 50 MG 24 hr tablet Take 1.5 tablets (75 mg total) by mouth 2 (two) times daily. 270 tablet 3   nitroGLYCERIN (NITROSTAT) 0.4 MG SL tablet Place 0.4 mg under the tongue every 5 (five) minutes as needed for chest pain.   1   tamsulosin (FLOMAX) 0.4 MG CAPS capsule Take 0.4 mg by mouth daily.     No current facility-administered medications for this visit.    No Known Allergies  Social History   Socioeconomic History   Marital status: Married    Spouse name: Not on file   Number of children: 2   Years of education: COLLEGE   Highest education level: Not on file  Occupational History   Occupation: RETIRED  Tobacco Use   Smoking status: Never   Smokeless tobacco: Never  Vaping Use   Vaping status: Never Used  Substance and Sexual Activity   Alcohol use: Not Currently    Comment: 07/17/2018 "a couple drinks/year; if that"   Drug use: Never   Sexual activity: Yes  Other Topics Concern   Not on file  Social History Narrative   Not on file   Social Drivers of Health   Financial Resource Strain: Not on file  Food Insecurity: No Food Insecurity (01/03/2024)   Hunger Vital Sign    Worried About Running Out of Food in the Last Year: Never true    Ran Out of Food in the Last Year: Never true  Transportation Needs: No Transportation Needs (01/03/2024)   PRAPARE - Administrator, Civil Service (Medical): No    Lack of Transportation (Non-Medical): No  Physical Activity: Not on file  Stress: Not on file  Social Connections: Moderately Isolated (01/03/2024)   Social Connection and Isolation Panel [NHANES]    Frequency of Communication with Friends and Family: Twice a week    Frequency of Social Gatherings with Friends and Family: Once a week    Attends Religious Services: 1 to 4 times per  year    Active Member of [employer] or Organizations: No    Attends Banker Meetings: Never    Marital Status: Widowed  Intimate Partner Violence: Not At Risk (01/03/2024)   Humiliation, Afraid, Rape, and Kick questionnaire    Fear of Current or Ex-Partner: No    Emotionally Abused: No    Physically Abused: No    Sexually Abused: No    Family History  Problem Relation Age of Onset   Healthy Mother        lived to be 58   CAD Father 37   Diabetes Brother    Hypertension Brother    CAD Brother 28       CABG    Review of Systems:  As stated in the HPI and otherwise negative.   BP (!) 158/92   Pulse 80   Ht 5\' 9"  (1.753 m)   Wt 69.9 kg   SpO2 95%   BMI 22.74 kg/m   Physical Examination: General: Well developed, well nourished, NAD  HEENT: OP clear, mucus membranes moist  SKIN: warm, dry. No rashes. Neuro: No focal deficits  Musculoskeletal: Muscle strength 5/5 all ext  Psychiatric: Mood and affect normal  Neck: No JVD, no carotid bruits, no thyromegaly, no lymphadenopathy.  Lungs:Clear bilaterally, no wheezes, rhonci, crackles Cardiovascular: Regular rate and rhythm. No murmurs, gallops or rubs. Abdomen:Soft. Bowel sounds present. Non-tender.  Extremities: No lower extremity edema. Pulses are 2 + in the bilateral DP/PT.  EKG:  EKG is not ordered today. The ekg ordered today demonstrates   Recent Labs: 01/02/2024: TSH 2.479 01/05/2024: ALT 62 01/06/2024: Magnesium 2.4 01/09/2024: BUN 9; Creatinine, Ser 0.78; Hemoglobin 14.2; Platelets 409; Potassium 4.2; Sodium 139   Lipid Panel    Component Value Date/Time   CHOL 164 03/27/2023 1528   TRIG 292 (H) 03/27/2023 1528   HDL 35 (L) 03/27/2023 1528   CHOLHDL 4.7 03/27/2023 1528   CHOLHDL 3.4 07/18/2018 0425   VLDL 17 07/18/2018 0425   LDLCALC 81 03/27/2023 1528     Wt Readings from Last 3 Encounters:  03/18/24 69.9 kg  01/07/24 74.6 kg  03/27/23 77.2 kg    Assessment and Plan:   1. CAD s/p CABG with  angina: He has severe CAD s/p CABG. He had several caths in 2019 with placement of stents in the RCA and left main artery into the intermediate branch. The LIMA to the LAD is patent and the SVG to the OM is patent. Echo August 2019 with normal LV systolic function. No change in his chronic angina. Continue Plavix, statin, Imdur and beta blocker. He did not tolerate Ranexa.    2. PAD: He is s/p right subclavian artery stent. No right arm weakness.   3. HTN: BP is elevated. Will continue Imdur and Toprol 75 mg BID. Will restart Norvasc 10 mg daily and hydrochlorothiazide 25 mg daily.   4. HLD: LDL 67 in January 2025. Continue statin.  5. PVCs: Rare palpitations. Continue beta blocker.   Labs/ tests ordered today include:   No orders of the defined types were placed in this encounter.  Disposition:   F/U with me in  12  months  Signed, Verne Carrow, MD 03/18/2024 11:14 AM    [facility] [address] [place], [place], [place]  [address] Phone: [phone]; Fax: [phone]

## 2024-03-18 NOTE — Patient Instructions (Signed)
 Medication Instructions:  Restart amlodipine 10 mg one tablet daily Restart hydrochlorothiazide 25 mg - one tablet daily *If you need a refill on your cardiac medications before your next appointment, please call your pharmacy*  Lab Work: none   Testing/Procedures: none  Follow-Up: At [facility], you and your health needs are our priority.  As part of our continuing mission to provide you with exceptional heart care, our providers are all part of one team.  This team includes your primary Cardiologist (physician) and Advanced Practice Providers or APPs (Physician Assistants and Nurse Practitioners) who all work together to provide you with the care you need, when you need it.  Your next appointment:   12 month(s)  Provider:   Verne Carrow, MD    We recommend signing up for the patient portal called "MyChart".  Sign up information is provided on this After Visit Summary.  MyChart is used to connect with patients for Virtual Visits (Telemedicine).  Patients are able to view lab/test results, encounter notes, upcoming appointments, etc.  Non-urgent messages can be sent to your provider as well.   To learn more about what you can do with MyChart, go to [email or web address].   Other Instructions       1st Floor: - Lobby - Registration  - Pharmacy  - Lab - Cafe  2nd Floor: - PV Lab - Diagnostic Testing (echo, CT, nuclear med)  3rd Floor: - Vacant  4th Floor: - TCTS (cardiothoracic surgery) - AFib Clinic - Structural Heart Clinic - Vascular Surgery  - Vascular Ultrasound  5th Floor: - HeartCare Cardiology (general and EP) - Clinical Pharmacy for coumadin, hypertension, lipid, weight-loss medications, and med management appointments    Valet parking services will be available as well.

## 2024-03-27 ENCOUNTER — Other Ambulatory Visit: Payer: Self-pay

## 2024-03-27 MED ORDER — ATORVASTATIN CALCIUM 80 MG PO TABS: 80.0000 mg | ORAL_TABLET | Freq: Every day | ORAL | 3 refills | Status: AC

## 2024-03-29 ENCOUNTER — Other Ambulatory Visit: Payer: Self-pay | Admitting: Cardiovascular Disease

## 2024-04-01 ENCOUNTER — Telehealth: Payer: Self-pay | Admitting: Cardiovascular Disease

## 2024-04-01 MED ORDER — METOPROLOL SUCCINATE ER 50 MG PO TB24: 75.0000 mg | ORAL_TABLET | Freq: Two times a day (BID) | ORAL | 3 refills | Status: DC

## 2024-04-01 NOTE — Telephone Encounter (Signed)
*  STAT* If patient is at the pharmacy, call can be transferred to refill team.   1. Which medications need to be refilled? (please list name of each medication and dose if known)   metoprolol succinate (TOPROL-XL) 50 MG 24 hr tablet   2. Would you like to learn more about the convenience, safety, & potential cost savings by using the [facility]?   3. Are you open to using the [facility] (Type [facility]. ).  4. Which pharmacy/location (including street and city if local pharmacy) is medication to be sent to?  [facility] - [place], [place] - [address]   5. Do they need a 30 day or 90 day supply?   90 day

## 2024-04-02 DIAGNOSIS — I2510 Atherosclerotic heart disease of native coronary artery without angina pectoris: Secondary | ICD-10-CM | POA: Diagnosis not present

## 2024-04-02 DIAGNOSIS — I10 Essential (primary) hypertension: Secondary | ICD-10-CM | POA: Diagnosis not present

## 2024-04-08 DIAGNOSIS — B9689 Other specified bacterial agents as the cause of diseases classified elsewhere: Secondary | ICD-10-CM | POA: Diagnosis not present

## 2024-04-08 DIAGNOSIS — H1033 Unspecified acute conjunctivitis, bilateral: Secondary | ICD-10-CM | POA: Diagnosis not present

## 2024-04-17 DIAGNOSIS — I10 Essential (primary) hypertension: Secondary | ICD-10-CM | POA: Diagnosis not present

## 2024-04-17 DIAGNOSIS — I739 Peripheral vascular disease, unspecified: Secondary | ICD-10-CM | POA: Diagnosis not present

## 2024-04-17 DIAGNOSIS — I2510 Atherosclerotic heart disease of native coronary artery without angina pectoris: Secondary | ICD-10-CM | POA: Diagnosis not present

## 2024-04-17 DIAGNOSIS — R7303 Prediabetes: Secondary | ICD-10-CM | POA: Diagnosis not present

## 2024-04-17 DIAGNOSIS — I700 Atherosclerosis of aorta: Secondary | ICD-10-CM | POA: Diagnosis not present

## 2024-04-17 DIAGNOSIS — N400 Enlarged prostate without lower urinary tract symptoms: Secondary | ICD-10-CM | POA: Diagnosis not present

## 2024-04-17 DIAGNOSIS — E7800 Pure hypercholesterolemia, unspecified: Secondary | ICD-10-CM | POA: Diagnosis not present

## 2024-05-01 DIAGNOSIS — I10 Essential (primary) hypertension: Secondary | ICD-10-CM | POA: Diagnosis not present

## 2024-05-01 DIAGNOSIS — I2510 Atherosclerotic heart disease of native coronary artery without angina pectoris: Secondary | ICD-10-CM | POA: Diagnosis not present

## 2024-05-18 DIAGNOSIS — E7800 Pure hypercholesterolemia, unspecified: Secondary | ICD-10-CM | POA: Diagnosis not present

## 2024-05-18 DIAGNOSIS — N400 Enlarged prostate without lower urinary tract symptoms: Secondary | ICD-10-CM | POA: Diagnosis not present

## 2024-05-18 DIAGNOSIS — I2510 Atherosclerotic heart disease of native coronary artery without angina pectoris: Secondary | ICD-10-CM | POA: Diagnosis not present

## 2024-05-18 DIAGNOSIS — I10 Essential (primary) hypertension: Secondary | ICD-10-CM | POA: Diagnosis not present

## 2024-05-31 DIAGNOSIS — I10 Essential (primary) hypertension: Secondary | ICD-10-CM | POA: Diagnosis not present

## 2024-05-31 DIAGNOSIS — I2510 Atherosclerotic heart disease of native coronary artery without angina pectoris: Secondary | ICD-10-CM | POA: Diagnosis not present

## 2024-06-04 ENCOUNTER — Other Ambulatory Visit: Payer: Self-pay | Admitting: Cardiovascular Disease

## 2024-06-17 DIAGNOSIS — I10 Essential (primary) hypertension: Secondary | ICD-10-CM | POA: Diagnosis not present

## 2024-06-17 DIAGNOSIS — E7800 Pure hypercholesterolemia, unspecified: Secondary | ICD-10-CM | POA: Diagnosis not present

## 2024-06-17 DIAGNOSIS — N400 Enlarged prostate without lower urinary tract symptoms: Secondary | ICD-10-CM | POA: Diagnosis not present

## 2024-06-17 DIAGNOSIS — I2510 Atherosclerotic heart disease of native coronary artery without angina pectoris: Secondary | ICD-10-CM | POA: Diagnosis not present

## 2024-06-30 DIAGNOSIS — I10 Essential (primary) hypertension: Secondary | ICD-10-CM | POA: Diagnosis not present

## 2024-06-30 DIAGNOSIS — I2510 Atherosclerotic heart disease of native coronary artery without angina pectoris: Secondary | ICD-10-CM | POA: Diagnosis not present

## 2024-07-05 DIAGNOSIS — H1033 Unspecified acute conjunctivitis, bilateral: Secondary | ICD-10-CM | POA: Diagnosis not present

## 2024-07-18 DIAGNOSIS — E7800 Pure hypercholesterolemia, unspecified: Secondary | ICD-10-CM | POA: Diagnosis not present

## 2024-07-18 DIAGNOSIS — I2510 Atherosclerotic heart disease of native coronary artery without angina pectoris: Secondary | ICD-10-CM | POA: Diagnosis not present

## 2024-07-18 DIAGNOSIS — N400 Enlarged prostate without lower urinary tract symptoms: Secondary | ICD-10-CM | POA: Diagnosis not present

## 2024-07-18 DIAGNOSIS — I10 Essential (primary) hypertension: Secondary | ICD-10-CM | POA: Diagnosis not present

## 2024-07-30 DIAGNOSIS — I2510 Atherosclerotic heart disease of native coronary artery without angina pectoris: Secondary | ICD-10-CM | POA: Diagnosis not present

## 2024-07-30 DIAGNOSIS — I10 Essential (primary) hypertension: Secondary | ICD-10-CM | POA: Diagnosis not present

## 2024-08-11 DIAGNOSIS — M11232 Other chondrocalcinosis, left wrist: Secondary | ICD-10-CM | POA: Diagnosis not present

## 2024-08-11 DIAGNOSIS — M25532 Pain in left wrist: Secondary | ICD-10-CM | POA: Diagnosis not present

## 2024-08-12 DIAGNOSIS — N400 Enlarged prostate without lower urinary tract symptoms: Secondary | ICD-10-CM | POA: Diagnosis not present

## 2024-08-12 DIAGNOSIS — K219 Gastro-esophageal reflux disease without esophagitis: Secondary | ICD-10-CM | POA: Diagnosis not present

## 2024-08-12 DIAGNOSIS — R7303 Prediabetes: Secondary | ICD-10-CM | POA: Diagnosis not present

## 2024-08-12 DIAGNOSIS — I700 Atherosclerosis of aorta: Secondary | ICD-10-CM | POA: Diagnosis not present

## 2024-08-12 DIAGNOSIS — I10 Essential (primary) hypertension: Secondary | ICD-10-CM | POA: Diagnosis not present

## 2024-08-12 DIAGNOSIS — I25119 Atherosclerotic heart disease of native coronary artery with unspecified angina pectoris: Secondary | ICD-10-CM | POA: Diagnosis not present

## 2024-08-12 DIAGNOSIS — I739 Peripheral vascular disease, unspecified: Secondary | ICD-10-CM | POA: Diagnosis not present

## 2024-08-12 DIAGNOSIS — E7800 Pure hypercholesterolemia, unspecified: Secondary | ICD-10-CM | POA: Diagnosis not present

## 2024-08-12 DIAGNOSIS — Z951 Presence of aortocoronary bypass graft: Secondary | ICD-10-CM | POA: Diagnosis not present

## 2024-08-18 DIAGNOSIS — I2510 Atherosclerotic heart disease of native coronary artery without angina pectoris: Secondary | ICD-10-CM | POA: Diagnosis not present

## 2024-08-18 DIAGNOSIS — I10 Essential (primary) hypertension: Secondary | ICD-10-CM | POA: Diagnosis not present

## 2024-08-18 DIAGNOSIS — E7800 Pure hypercholesterolemia, unspecified: Secondary | ICD-10-CM | POA: Diagnosis not present

## 2024-08-18 DIAGNOSIS — N400 Enlarged prostate without lower urinary tract symptoms: Secondary | ICD-10-CM | POA: Diagnosis not present

## 2024-08-20 DIAGNOSIS — R338 Other retention of urine: Secondary | ICD-10-CM | POA: Diagnosis not present

## 2024-08-20 DIAGNOSIS — N401 Enlarged prostate with lower urinary tract symptoms: Secondary | ICD-10-CM | POA: Diagnosis not present

## 2024-08-27 DIAGNOSIS — N401 Enlarged prostate with lower urinary tract symptoms: Secondary | ICD-10-CM | POA: Diagnosis not present

## 2024-08-27 DIAGNOSIS — R338 Other retention of urine: Secondary | ICD-10-CM | POA: Diagnosis not present

## 2024-08-28 ENCOUNTER — Other Ambulatory Visit: Payer: Self-pay

## 2024-08-29 DIAGNOSIS — I10 Essential (primary) hypertension: Secondary | ICD-10-CM | POA: Diagnosis not present

## 2024-08-29 DIAGNOSIS — I2510 Atherosclerotic heart disease of native coronary artery without angina pectoris: Secondary | ICD-10-CM | POA: Diagnosis not present

## 2024-08-29 MED ORDER — AMLODIPINE BESYLATE 10 MG PO TABS: 10.0000 mg | ORAL_TABLET | Freq: Every day | ORAL | 2 refills | Status: AC

## 2024-09-17 DIAGNOSIS — E7800 Pure hypercholesterolemia, unspecified: Secondary | ICD-10-CM | POA: Diagnosis not present

## 2024-09-17 DIAGNOSIS — I10 Essential (primary) hypertension: Secondary | ICD-10-CM | POA: Diagnosis not present

## 2024-09-17 DIAGNOSIS — I2510 Atherosclerotic heart disease of native coronary artery without angina pectoris: Secondary | ICD-10-CM | POA: Diagnosis not present

## 2024-09-17 DIAGNOSIS — N400 Enlarged prostate without lower urinary tract symptoms: Secondary | ICD-10-CM | POA: Diagnosis not present

## 2024-09-19 DIAGNOSIS — L509 Urticaria, unspecified: Secondary | ICD-10-CM | POA: Diagnosis not present

## 2024-09-28 DIAGNOSIS — I10 Essential (primary) hypertension: Secondary | ICD-10-CM | POA: Diagnosis not present

## 2024-09-28 DIAGNOSIS — I2510 Atherosclerotic heart disease of native coronary artery without angina pectoris: Secondary | ICD-10-CM | POA: Diagnosis not present

## 2024-10-12 DIAGNOSIS — Z23 Encounter for immunization: Secondary | ICD-10-CM | POA: Diagnosis not present

## 2024-10-18 DIAGNOSIS — E7800 Pure hypercholesterolemia, unspecified: Secondary | ICD-10-CM | POA: Diagnosis not present

## 2024-10-18 DIAGNOSIS — N400 Enlarged prostate without lower urinary tract symptoms: Secondary | ICD-10-CM | POA: Diagnosis not present

## 2024-10-18 DIAGNOSIS — I2510 Atherosclerotic heart disease of native coronary artery without angina pectoris: Secondary | ICD-10-CM | POA: Diagnosis not present

## 2024-10-18 DIAGNOSIS — I10 Essential (primary) hypertension: Secondary | ICD-10-CM | POA: Diagnosis not present

## 2024-10-28 DIAGNOSIS — I10 Essential (primary) hypertension: Secondary | ICD-10-CM | POA: Diagnosis not present

## 2024-10-28 DIAGNOSIS — I2510 Atherosclerotic heart disease of native coronary artery without angina pectoris: Secondary | ICD-10-CM | POA: Diagnosis not present

## 2024-11-17 DIAGNOSIS — I10 Essential (primary) hypertension: Secondary | ICD-10-CM | POA: Diagnosis not present

## 2024-11-17 DIAGNOSIS — E7800 Pure hypercholesterolemia, unspecified: Secondary | ICD-10-CM | POA: Diagnosis not present

## 2024-11-17 DIAGNOSIS — I2510 Atherosclerotic heart disease of native coronary artery without angina pectoris: Secondary | ICD-10-CM | POA: Diagnosis not present

## 2024-11-17 DIAGNOSIS — N400 Enlarged prostate without lower urinary tract symptoms: Secondary | ICD-10-CM | POA: Diagnosis not present

## 2025-01-19 ENCOUNTER — Other Ambulatory Visit: Payer: Self-pay | Admitting: Cardiovascular Disease
